# Patient Record
Sex: Female | Born: 1955 | Race: White | Hispanic: No | Marital: Married | Smoking: Never smoker
Health system: Southern US, Community
[De-identification: ages and names within clinical notes are randomized; demographics above are authoritative.]

## PROBLEM LIST (undated history)

## (undated) DIAGNOSIS — L309 Dermatitis, unspecified: Secondary | ICD-10-CM

## (undated) DIAGNOSIS — G4730 Sleep apnea, unspecified: Secondary | ICD-10-CM

## (undated) DIAGNOSIS — E039 Hypothyroidism, unspecified: Secondary | ICD-10-CM

## (undated) DIAGNOSIS — I10 Essential (primary) hypertension: Secondary | ICD-10-CM

## (undated) DIAGNOSIS — E079 Disorder of thyroid, unspecified: Secondary | ICD-10-CM

## (undated) HISTORY — DX: Essential (primary) hypertension: I10

## (undated) HISTORY — PX: ABDOMINAL HYSTERECTOMY: SHX81

## (undated) HISTORY — DX: Sleep apnea, unspecified: G47.30

## (undated) HISTORY — DX: Disorder of thyroid, unspecified: E07.9

## (undated) HISTORY — PX: BILATERAL OOPHORECTOMY: SHX1221

## (undated) HISTORY — PX: OTHER SURGICAL HISTORY: SHX169

## (undated) HISTORY — DX: Dermatitis, unspecified: L30.9

---

## 1994-02-14 HISTORY — PX: SINUS EXPLORATION: SHX5214

## 1997-08-06 ENCOUNTER — Ambulatory Visit (HOSPITAL_COMMUNITY): Admission: RE | Admit: 1997-08-06 | Discharge: 1997-08-06 | Payer: Self-pay | Admitting: Obstetrics & Gynecology

## 1997-08-27 ENCOUNTER — Other Ambulatory Visit: Admission: RE | Admit: 1997-08-27 | Discharge: 1997-08-27 | Payer: Self-pay | Admitting: Obstetrics & Gynecology

## 1997-12-18 ENCOUNTER — Other Ambulatory Visit: Admission: RE | Admit: 1997-12-18 | Discharge: 1997-12-18 | Payer: Self-pay | Admitting: *Deleted

## 1998-09-03 ENCOUNTER — Other Ambulatory Visit: Admission: RE | Admit: 1998-09-03 | Discharge: 1998-09-03 | Payer: Self-pay | Admitting: Obstetrics & Gynecology

## 1999-11-05 ENCOUNTER — Other Ambulatory Visit: Admission: RE | Admit: 1999-11-05 | Discharge: 1999-11-05 | Payer: Self-pay | Admitting: Obstetrics & Gynecology

## 2000-08-15 ENCOUNTER — Encounter: Payer: Self-pay | Admitting: Pulmonary Disease

## 2000-08-15 ENCOUNTER — Ambulatory Visit (HOSPITAL_BASED_OUTPATIENT_CLINIC_OR_DEPARTMENT_OTHER): Admission: RE | Admit: 2000-08-15 | Discharge: 2000-08-15 | Payer: Self-pay | Admitting: Internal Medicine

## 2000-09-08 ENCOUNTER — Ambulatory Visit (HOSPITAL_COMMUNITY): Admission: RE | Admit: 2000-09-08 | Discharge: 2000-09-08 | Payer: Self-pay | Admitting: *Deleted

## 2000-09-14 ENCOUNTER — Other Ambulatory Visit: Admission: RE | Admit: 2000-09-14 | Discharge: 2000-09-14 | Payer: Self-pay | Admitting: Obstetrics & Gynecology

## 2001-02-20 ENCOUNTER — Ambulatory Visit (HOSPITAL_COMMUNITY): Admission: RE | Admit: 2001-02-20 | Discharge: 2001-02-20 | Payer: Self-pay | Admitting: Gastroenterology

## 2001-04-24 ENCOUNTER — Ambulatory Visit (HOSPITAL_COMMUNITY): Admission: RE | Admit: 2001-04-24 | Discharge: 2001-04-24 | Payer: Self-pay | Admitting: Gastroenterology

## 2001-06-05 ENCOUNTER — Encounter: Payer: Self-pay | Admitting: Internal Medicine

## 2001-06-05 ENCOUNTER — Ambulatory Visit (HOSPITAL_COMMUNITY): Admission: RE | Admit: 2001-06-05 | Discharge: 2001-06-05 | Payer: Self-pay | Admitting: Internal Medicine

## 2001-11-12 ENCOUNTER — Other Ambulatory Visit: Admission: RE | Admit: 2001-11-12 | Discharge: 2001-11-12 | Payer: Self-pay | Admitting: Obstetrics & Gynecology

## 2002-12-06 ENCOUNTER — Other Ambulatory Visit: Admission: RE | Admit: 2002-12-06 | Discharge: 2002-12-06 | Payer: Self-pay | Admitting: Obstetrics & Gynecology

## 2004-09-09 ENCOUNTER — Ambulatory Visit: Payer: Self-pay | Admitting: Internal Medicine

## 2004-11-16 ENCOUNTER — Ambulatory Visit: Payer: Self-pay | Admitting: Internal Medicine

## 2004-11-30 ENCOUNTER — Ambulatory Visit: Payer: Self-pay | Admitting: Internal Medicine

## 2004-12-02 ENCOUNTER — Ambulatory Visit: Payer: Self-pay | Admitting: Internal Medicine

## 2004-12-23 ENCOUNTER — Ambulatory Visit: Payer: Self-pay | Admitting: Internal Medicine

## 2004-12-27 ENCOUNTER — Ambulatory Visit: Payer: Self-pay | Admitting: Internal Medicine

## 2005-01-14 ENCOUNTER — Ambulatory Visit: Payer: Self-pay | Admitting: Pulmonary Disease

## 2005-05-16 ENCOUNTER — Ambulatory Visit: Payer: Self-pay | Admitting: Internal Medicine

## 2005-06-15 ENCOUNTER — Ambulatory Visit: Payer: Self-pay | Admitting: Internal Medicine

## 2005-06-16 ENCOUNTER — Ambulatory Visit: Payer: Self-pay | Admitting: Internal Medicine

## 2005-07-27 ENCOUNTER — Ambulatory Visit: Payer: Self-pay | Admitting: Internal Medicine

## 2005-07-29 ENCOUNTER — Ambulatory Visit: Payer: Self-pay | Admitting: Internal Medicine

## 2005-08-03 ENCOUNTER — Ambulatory Visit: Payer: Self-pay | Admitting: Internal Medicine

## 2005-09-01 ENCOUNTER — Ambulatory Visit: Payer: Self-pay | Admitting: Internal Medicine

## 2005-10-31 ENCOUNTER — Ambulatory Visit: Payer: Self-pay | Admitting: Internal Medicine

## 2005-11-24 ENCOUNTER — Ambulatory Visit: Payer: Self-pay | Admitting: Internal Medicine

## 2005-12-05 ENCOUNTER — Ambulatory Visit: Payer: Self-pay | Admitting: Internal Medicine

## 2006-02-28 ENCOUNTER — Ambulatory Visit: Payer: Self-pay | Admitting: Internal Medicine

## 2006-07-13 ENCOUNTER — Ambulatory Visit (HOSPITAL_BASED_OUTPATIENT_CLINIC_OR_DEPARTMENT_OTHER): Admission: RE | Admit: 2006-07-13 | Discharge: 2006-07-13 | Payer: Self-pay | Admitting: Orthopedic Surgery

## 2006-07-13 ENCOUNTER — Encounter (INDEPENDENT_AMBULATORY_CARE_PROVIDER_SITE_OTHER): Payer: Self-pay | Admitting: Orthopedic Surgery

## 2006-08-21 ENCOUNTER — Ambulatory Visit: Payer: Self-pay | Admitting: Internal Medicine

## 2006-10-31 ENCOUNTER — Encounter: Admission: RE | Admit: 2006-10-31 | Discharge: 2006-10-31 | Payer: Self-pay | Admitting: Obstetrics & Gynecology

## 2007-02-17 DIAGNOSIS — J3089 Other allergic rhinitis: Secondary | ICD-10-CM

## 2007-02-17 DIAGNOSIS — J302 Other seasonal allergic rhinitis: Secondary | ICD-10-CM

## 2007-02-17 DIAGNOSIS — J4540 Moderate persistent asthma, uncomplicated: Secondary | ICD-10-CM

## 2007-02-17 DIAGNOSIS — G4733 Obstructive sleep apnea (adult) (pediatric): Secondary | ICD-10-CM

## 2007-02-19 ENCOUNTER — Ambulatory Visit: Payer: Self-pay | Admitting: Internal Medicine

## 2007-03-26 ENCOUNTER — Ambulatory Visit: Payer: Self-pay | Admitting: Internal Medicine

## 2007-06-12 ENCOUNTER — Ambulatory Visit: Payer: Self-pay | Admitting: Internal Medicine

## 2007-06-12 DIAGNOSIS — R55 Syncope and collapse: Secondary | ICD-10-CM | POA: Insufficient documentation

## 2007-06-12 DIAGNOSIS — D1722 Benign lipomatous neoplasm of skin and subcutaneous tissue of left arm: Secondary | ICD-10-CM | POA: Insufficient documentation

## 2007-06-12 DIAGNOSIS — D179 Benign lipomatous neoplasm, unspecified: Secondary | ICD-10-CM | POA: Insufficient documentation

## 2007-06-29 ENCOUNTER — Ambulatory Visit: Payer: Self-pay | Admitting: Internal Medicine

## 2007-06-29 DIAGNOSIS — R1011 Right upper quadrant pain: Secondary | ICD-10-CM | POA: Insufficient documentation

## 2007-06-30 LAB — CONVERTED CEMR LAB
ALT: 23 units/L (ref 0–35)
AST: 19 units/L (ref 0–37)
Albumin: 3.9 g/dL (ref 3.5–5.2)
Alkaline Phosphatase: 52 units/L (ref 39–117)
Amylase: 76 units/L (ref 27–131)
BUN: 12 mg/dL (ref 6–23)
Basophils Absolute: 0.1 10*3/uL (ref 0.0–0.1)
Basophils Relative: 0.7 % (ref 0.0–1.0)
Bilirubin, Direct: 0.1 mg/dL (ref 0.0–0.3)
CO2: 33 meq/L — ABNORMAL HIGH (ref 19–32)
Calcium: 9.9 mg/dL (ref 8.4–10.5)
Chloride: 100 meq/L (ref 96–112)
Creatinine, Ser: 0.8 mg/dL (ref 0.4–1.2)
Eosinophils Absolute: 0.3 10*3/uL (ref 0.0–0.7)
Eosinophils Relative: 4 % (ref 0.0–5.0)
GFR calc Af Amer: 98 mL/min
GFR calc non Af Amer: 81 mL/min
Glucose, Bld: 91 mg/dL (ref 70–99)
H Pylori IgG: NEGATIVE
HCT: 42.1 % (ref 36.0–46.0)
Hemoglobin: 14.6 g/dL (ref 12.0–15.0)
Lipase: 19 units/L (ref 11.0–59.0)
Lymphocytes Relative: 29.4 % (ref 12.0–46.0)
MCHC: 34.6 g/dL (ref 30.0–36.0)
MCV: 91.9 fL (ref 78.0–100.0)
Monocytes Absolute: 0.6 10*3/uL (ref 0.1–1.0)
Monocytes Relative: 8.2 % (ref 3.0–12.0)
Neutro Abs: 4.2 10*3/uL (ref 1.4–7.7)
Neutrophils Relative %: 57.7 % (ref 43.0–77.0)
Platelets: 257 10*3/uL (ref 150–400)
Potassium: 3.4 meq/L — ABNORMAL LOW (ref 3.5–5.1)
RBC: 4.57 M/uL (ref 3.87–5.11)
RDW: 12.4 % (ref 11.5–14.6)
Sodium: 140 meq/L (ref 135–145)
Total Bilirubin: 0.6 mg/dL (ref 0.3–1.2)
Total Protein: 6.9 g/dL (ref 6.0–8.3)
WBC: 7.3 10*3/uL (ref 4.5–10.5)

## 2007-07-18 ENCOUNTER — Encounter: Admission: RE | Admit: 2007-07-18 | Discharge: 2007-07-18 | Payer: Self-pay | Admitting: Internal Medicine

## 2007-10-23 ENCOUNTER — Ambulatory Visit: Payer: Self-pay | Admitting: Internal Medicine

## 2007-10-24 ENCOUNTER — Telehealth: Payer: Self-pay | Admitting: Internal Medicine

## 2008-01-17 ENCOUNTER — Telehealth (INDEPENDENT_AMBULATORY_CARE_PROVIDER_SITE_OTHER): Payer: Self-pay | Admitting: *Deleted

## 2008-02-29 ENCOUNTER — Ambulatory Visit: Payer: Self-pay | Admitting: Internal Medicine

## 2008-02-29 DIAGNOSIS — J328 Other chronic sinusitis: Secondary | ICD-10-CM | POA: Insufficient documentation

## 2008-02-29 DIAGNOSIS — J329 Chronic sinusitis, unspecified: Secondary | ICD-10-CM | POA: Insufficient documentation

## 2008-03-05 ENCOUNTER — Telehealth (INDEPENDENT_AMBULATORY_CARE_PROVIDER_SITE_OTHER): Payer: Self-pay | Admitting: *Deleted

## 2008-05-09 ENCOUNTER — Telehealth (INDEPENDENT_AMBULATORY_CARE_PROVIDER_SITE_OTHER): Payer: Self-pay | Admitting: *Deleted

## 2008-10-31 ENCOUNTER — Ambulatory Visit: Payer: Self-pay | Admitting: Internal Medicine

## 2008-10-31 DIAGNOSIS — J209 Acute bronchitis, unspecified: Secondary | ICD-10-CM | POA: Insufficient documentation

## 2009-03-11 ENCOUNTER — Telehealth (INDEPENDENT_AMBULATORY_CARE_PROVIDER_SITE_OTHER): Payer: Self-pay | Admitting: *Deleted

## 2009-03-12 ENCOUNTER — Telehealth: Payer: Self-pay | Admitting: Internal Medicine

## 2009-03-24 ENCOUNTER — Ambulatory Visit: Payer: Self-pay | Admitting: Internal Medicine

## 2009-03-24 DIAGNOSIS — R040 Epistaxis: Secondary | ICD-10-CM | POA: Insufficient documentation

## 2009-05-25 ENCOUNTER — Emergency Department (HOSPITAL_COMMUNITY): Admission: EM | Admit: 2009-05-25 | Discharge: 2009-05-25 | Payer: Self-pay | Admitting: Emergency Medicine

## 2009-09-16 ENCOUNTER — Encounter: Payer: Self-pay | Admitting: Internal Medicine

## 2009-10-28 ENCOUNTER — Ambulatory Visit: Payer: Self-pay | Admitting: Internal Medicine

## 2009-12-25 ENCOUNTER — Encounter: Payer: Self-pay | Admitting: Internal Medicine

## 2009-12-30 ENCOUNTER — Telehealth: Payer: Self-pay | Admitting: Internal Medicine

## 2010-03-07 ENCOUNTER — Encounter: Payer: Self-pay | Admitting: Internal Medicine

## 2010-03-16 NOTE — Progress Notes (Signed)
Summary: req to be seen asap today/ allergies  Phone Note Call from Patient Call back at Home Phone [phone]   Caller: Patient Call For: young Summary of Call: pt having an allergy 'attack". wants to be seen today. says this began this morning. she has been sneezing constantly/ runny nose/ itching ears and throat. "miserable". call back asap please Initial call taken by: Tivis Ringer, CNA,  December 30, 2009 3:11 PM  Follow-up for Phone Call        Spoke with patient-aware that [facility] is not seeing patients this afternoon but will send phone note for advice. Pt states she is having increased sneezing attacks today, runny nose, itchy throat and ears as well as chills. Pt has not be able to check for a fever; she did take a Benadryl and no relief. Pt states she was unable to get Omnaris nasal spray from 9-11 visit due to cost-would like a generic version that she could use as well and would be cheaper.    Allergies: PCN, Zpak, Codeine, and Seafood.   Reynaldo Minium CMA  December 30, 2009 3:35 PM   Additional Follow-up for Phone Call Additional follow up Details #1::        Per [facility]-ok to give Prednisone 20mg  #2 take 1 today then 1 tomorrow no refills.Reynaldo Minium CMA  December 30, 2009 4:38 PM     Additional Follow-up for Phone Call Additional follow up Details #2::    Pt aware that RX sent and to call Friday am if this doesnt stop.Reynaldo Minium CMA  December 30, 2009 4:39 PM   New/Updated Medications: PREDNISONE 20 MG TABS (PREDNISONE) take 1 by mouth today then 1 tomorrow Prescriptions: PREDNISONE 20 MG TABS (PREDNISONE) take 1 by mouth today then 1 tomorrow  #2 x 0   Entered by:   Reynaldo Minium CMA   Authorized by:   Waymon Budge MD   Signed by:   Reynaldo Minium CMA on 12/30/2009   Method used:   Electronically to        [facility] (retail)       [address]       [place], [place]  [address]       Ph: [phone]       Fax:  [phone]   RxID:   [id number]

## 2010-03-16 NOTE — Progress Notes (Signed)
Summary: notes/records  Phone Note From Other Clinic   Caller: [facility] [place] - [place] Call For: Maple Hudson Summary of Call: Need record on pt for surgery - sleep study, cpap, ekg and last office notes.  Faxed [phone]  Initial call taken by: Eugene Gavia,  March 11, 2009 2:09 PM  Follow-up for Phone Call        faxed notes,1/26//Juanita no sleep study, cpap, or ekg in emr. Follow-up by: Darletta Moll,  March 11, 2009 3:41 PM

## 2010-03-16 NOTE — Assessment & Plan Note (Signed)
Summary: SNORING/APC   Primary Dominique Brock/Referring [name]:  Norins  CC:  Follow up visit-Started snoring again per Spouse.  History of Present Illness: 02/29/08- Allergic rhinitis, asthma, OSA Voice changes out in cold air. Missed a day of work one day.Trying a power nasal rinser that seems to get water into her eustachian tubes. Some green and blood related to drying. Some headache. Chest is ok.  OSA- continues to use Vivactil, whichhas been sufficient to control snoring and keep her feeling alert in day time. Sleep hygiene has been good.  October 31, 2008 --Presents for an acute office visit. Complains of sinus pressure/congestion with PND causing sore throat, dry cough, soreness in chest, chills x1week . Denies chest pain, dyspnea, orthopnea, hemoptysis, fever, n/v/d, edema, headache,recent travel or antibiotics.  OTC not working.    March 24, 2009 Allergic rhinitis, asthma, OSA Feeling well. Pending oophorectomy next week. Sleep ok - vivactil is ok Angioedema with a salad when she went out to eat.  Alcohol gives her unpleasant nasal congestion and is avoided.  October 28, 2009- Allergic rhinitis, asthma,  Hx OSA No further episodes of angioedema, with food or otherwise, so that is not an ongoing problem.  Some itching and burning of eyes, frog in throat, husband reports snoring again. Sleepier in afternoon. Has continued Vivactil. Chest ok, no wheeze. Has no rescue inhaler- not needed.     Asthma History    Initial Asthma Severity Rating:    Age range: 12+ years    Symptoms: 0-2 days/week    Nighttime Awakenings: 0-2/month    Interferes w/ normal activity: no limitations    SABA use (not for EIB): 0-2 days/week    Asthma Severity Assessment: Intermittent   Preventive Screening-Counseling & Management  Alcohol-Tobacco     Smoking Status: never  Current Medications (verified): 1)  Hydrochlorothiazide 25 Mg  Tabs (Hydrochlorothiazide) .Marland Kitchen.. 1 Daily 2)  Vivactil 10  Mg  Tabs (Protriptyline Hcl) .... 1/2 At Bedtime For Sleep Apnea 3)  Atenolol 25 Mg  Tabs (Atenolol) .... Take 1 Tablet By Mouth Once A Day 4)  Vitamin D 400 Unit  Tabs (Cholecalciferol) .... 4 Daily 5)  Caltrate 600+d Plus 600-400 Mg-Unit  Tabs (Calcium Carbonate-Vit D-Min) .Marland Kitchen.. 1 By Mouth Once Daily 6)  Epipen 0.3 Mg/0.4ml (1:1000) Devi (Epinephrine Hcl (Anaphylaxis)) .... For Severe Allergic Reaction 7)  Estrogen Patch .... Twice A Week  Allergies (verified): 1)  Zithromax 2)  Codeine 3)  Penicillin 4)  * Seafood  Past History:  Past Medical History: Last updated: 03/26/2007 allergic rhinitis allergic asthma Diabetes, Type 2 Hypertension Sleep Apnea- mild  Family History: Last updated: 02/29/2008 mother - hypertension, hypercholesterolemia, liver cancer, allergies, DM father - healthy 2 siblings healthy  Social History: Last updated: 02/17/2007 Married with son and daughter Patient never smoked.   Risk Factors: Smoking Status: never (10/28/2009)  Past Surgical History: vaginal hysterectomy bilateral oophorectomy Sinus surgery 1996  Review of Systems      See HPI       The patient complains of nasal congestion/difficulty breathing through nose.  The patient denies shortness of breath with activity, shortness of breath at rest, productive cough, non-productive cough, coughing up blood, chest pain, irregular heartbeats, acid heartburn, indigestion, loss of appetite, weight change, abdominal pain, difficulty swallowing, sore throat, tooth/dental problems, headaches, itching, ear ache, joint stiffness or pain, rash, change in color of mucus, and fever.    Vital Signs:  Patient profile:   55 year old female Height:  65 inches Weight:      141.13 pounds BMI:     23.57 O2 Sat:      99 % on Room air Pulse rate:   73 / minute BP sitting:   130 / 88  (left arm) Cuff size:   regular  Vitals Entered By: Reynaldo Minium CMA (October 28, 2009 8:56 AM)  O2 Flow:   Room air CC: Follow up visit-Started snoring again per Spouse   Physical Exam  Additional Exam:  General: A/Ox3; pleasant and cooperative, NAD, medium build. SKIN: no rash, lesions NODES: no lymphadenopathy HEENT: [place]/AT, EOM- WNL, Conjuctivae- clear, Chronic periorbital edema, PERRLA, TM-WNL, Nose- pale clear drainage. , Throat- clear and wnl, Mallampati  III NECK: Supple w/ fair ROM, JVD- none, normal carotid impulses w/o bruits Thyroid- normal to palpation CHEST: Clear to P&A HEART: RRR, no m/g/r heard ABDOMEN: Soft and nl; HYQ:MVHQ, nl pulses, no edema  NEURO: Grossly intact to observation      Impression & Recommendations:  Problem # 1:  ALLERGIC RHINITIS (ICD-477.9)  Seasonally worse. This corresponds to her increased snoring and head congestion. Sudafed makes her chest hurt. We discussed nasal strips, phenylephrine and a nasal steroid spray. She does saline rinse some.  Her updated medication list for this problem includes:    Omnaris 50 Mcg/act Susp (Ciclesonide) .Marland Kitchen... 1-2 puffs each nostril once daily at bedtime  Problem # 2:  ALLERGIC ASTHMA (ICD-493.00) Not actively bothering her. We don't need a rescue inhaler at this time.  Problem # 3:  Hx of SLEEP APNEA (ICD-780.57)  She was not told of breathing problems at the time of her surgery/ anesthesia, and it sounds as if Vivacitl had done ok until her allergic rhinitis (and some weight gain) out ran it. We will see how she does as we treat her rhinitis.  Medications Added to Medication List This Visit: 1)  Estrogen Patch  .... Twice a week 2)  Omnaris 50 Mcg/act Susp (Ciclesonide) .Marland Kitchen.. 1-2 puffs each nostril once daily at bedtime  Other Orders: Est. Patient Level IV (46962)  Patient Instructions: 1)  Please schedule a follow-up appointment in 6 months. 2)  Sample nasal steroid spray: 1-2 puffs each nostril once daily at bedtime. Script for [employer]. 3)  Try otc decongestant phenylephrine/ Sudafed-PE if needed,  perhaps just occasionally, once daily in the morning. 4)  Try nasal strips/ Breathe Right 5)    6)  Vivactil refilled Prescriptions: OMNARIS 50 MCG/ACT SUSP (CICLESONIDE) 1-2 puffs each nostril once daily at bedtime  #1 x prn   Entered and Authorized by:   Waymon Budge MD   Signed by:   Waymon Budge MD on 10/28/2009   Method used:   Print then Give to Patient   RxID:   [id number] VIVACTIL 10 MG  TABS (PROTRIPTYLINE HCL) 1/2 at bedtime for sleep apnea  #30 Each x 4   Entered and Authorized by:   Waymon Budge MD   Signed by:   Waymon Budge MD on 10/28/2009   Method used:   Print then Give to Patient   RxID:   [id number]

## 2010-03-16 NOTE — Assessment & Plan Note (Signed)
Summary: 1 YEAR/MBW   Primary Provider/Referring Provider:  Norins  CC:  Yearly follow up visit-no complaints; Surgery on Thursday.Marland Kitchen  History of Present Illness: 03/26/07- 1 week head congestion. Throat,mouth, ears and nose itch,. Tight across forehead . Chest ok. Nasal discharge is yellow green red. Benadryl didn't help. Zyrtec helps but wears out. cautious about decongestants because of her BP. 10/23/07- 55 year old allergic rhinitis and asthma.  October 23, 2007---Complains of left sided facial pressure x 2 days, along with runny nose and sneezing. congestion and dry cough.   02/29/08- Allergic rhinitis, asthma, OSA Voice changes out in cold air. Missed a day of work one day.Trying a power nasal rinser that seems to get water into her eustachian tubes. Some green and blood related to drying. Some headache. Chest is ok.  OSA- continues to use Vivactil, whichhas been sufficient to control snoring and keep her feeling alert in day time. Sleep hygiene has been good.  October 31, 2008 --Presents for an acute office visit. Complains of sinus pressure/congestion with PND causing sore throat, dry cough, soreness in chest, chills x1week . Denies chest pain, dyspnea, orthopnea, hemoptysis, fever, n/v/d, edema, headache,recent travel or antibiotics.  OTC not working.    March 24, 2009 Allergic rhinitis, asthma, OSA Feeling well. Pending oophorectomy next week. Sleep ok - vivactil is ok Angioedema with a salad when she went out to eat.  Alcohol gives her unpleasant nasal congestion and is avoided.   Current Medications (verified): 1)  Hydrochlorothiazide 25 Mg  Tabs (Hydrochlorothiazide) .Marland Kitchen.. 1 Daily 2)  Vivactil 10 Mg  Tabs (Protriptyline Hcl) .... 1/2 At Bedtime For Sleep Apnea 3)  Epipen 2-Pak 0.3 Mg/0.46ml (1:1000)  Devi (Epinephrine Hcl (Anaphylaxis)) .... For Severe Allergic Reaction 4)  Atenolol 25 Mg  Tabs (Atenolol) .... Take 1 Tablet By Mouth Once A Day 5)  Vitamin D 400 Unit  Tabs  (Cholecalciferol) .... 4 Daily 6)  Caltrate 600+d Plus 600-400 Mg-Unit  Tabs (Calcium Carbonate-Vit D-Min) .Marland Kitchen.. 1 By Mouth Once Daily 7)  Vivactil 10 Mg Tabs (Protriptyline Hcl) .Marland Kitchen.. 1 Daily 8)  Epipen 0.3 Mg/0.7ml (1:1000) Devi (Epinephrine Hcl (Anaphylaxis)) .... For Severe Allergic Reaction  Allergies (verified): 1)  Zithromax 2)  Codeine 3)  Penicillin 4)  * Seafood  Past History:  Past Medical History: Last updated: 03/26/2007 allergic rhinitis allergic asthma Diabetes, Type 2 Hypertension Sleep Apnea- mild  Past Surgical History: Last updated: 03/26/2007 vaginal hysterectomy  Family History: Last updated: 02/29/2008 mother - hypertension, hypercholesterolemia, liver cancer, allergies, DM father - healthy 2 siblings healthy  Social History: Last updated: 02/17/2007 Married with son and daughter Patient never smoked.   Risk Factors: Smoking Status: never (10/23/2007)  Review of Systems      See HPI  The patient denies anorexia, fever, weight loss, weight gain, vision loss, decreased hearing, hoarseness, chest pain, syncope, dyspnea on exertion, peripheral edema, prolonged cough, headaches, hemoptysis, abdominal pain, and severe indigestion/heartburn.         Recent epistaxis while on aspirin  Vital Signs:  Patient profile:   55 year old female Height:      65 inches Weight:      139.13 pounds BMI:     23.24 O2 Sat:      100 % on Room air Pulse rate:   87 / minute BP sitting:   128 / 84  (left arm) Cuff size:   regular  Vitals Entered By: Reynaldo Minium CMA (March 24, 2009 3:32 PM)  O2  Flow:  Room air  Physical Exam  Additional Exam:  General: A/Ox3; pleasant and cooperative, NAD, medium build SKIN: no rash, lesions NODES: no lymphadenopathy HEENT: [place]/AT, EOM- WNL, Conjuctivae- clear, Chronic periorbital edema, PERRLA, TM-WNL, Nose- pale clear drainage- no obvious bleeding source. , Throat- clear and wnl, Melampatti III NECK: Supple w/ fair ROM,  JVD- none, normal carotid impulses w/o bruits Thyroid- normal to palpation CHEST: Clear to P&A HEART: RRR, no m/g/r heard ABDOMEN: Soft and nl; nml bowel sounds; no organomegaly or masses noted KGM:WNUU, nl pulses, no edema  NEURO: Grossly intact to observation      Impression & Recommendations:  Problem # 1:  ALLERGIC ASTHMA (ICD-493.00)  She is doing very well now and is clear for necessary surgery next week with no problems reported,  Orders: Est. Patient Level III (72536)  Problem # 2:  EPISTAXIS, RECURRENT (ICD-784.7)  Noted recently on maintenance aspirin. I don't see an anterior source. Suggested she ask her doctor to change to baby aspirin. We discussed compression and afrin if needed.  Patient Instructions: 1)  Schedule return in one year, earlier if needed 2)  Call as needed for refills 3)  Epipen refilled 4)  Consider an antihistamine an hour or so before going out to eat, if concerned. 5)  Afrin may help stop a mild nose bleed. 6)  Ask about reducing aspirin to 81 mg/ baby aspirin if you keep having nosebleeds Prescriptions: EPIPEN 0.3 MG/0.3ML (1:1000) DEVI (EPINEPHRINE HCL (ANAPHYLAXIS)) For severe allergic reaction  #1 x prn   Entered and Authorized by:   Waymon Budge MD   Signed by:   Waymon Budge MD on 03/24/2009   Method used:   Print then Give to Patient   RxID:   [id number]

## 2010-03-16 NOTE — Procedures (Signed)
Summary: Colonoscopy / [facility]  Colonoscopy / [facility]   Imported By: Lennie Odor 01/05/2010 09:07:41  _____________________________________________________________________  External Attachment:    Type:   Image     Comment:   External Document

## 2010-03-16 NOTE — Letter (Signed)
Summary: [facility]  [facility]   Imported By: Lennie Odor 09/22/2009 13:53:05  _____________________________________________________________________  External Attachment:    Type:   Image     Comment:   External Document

## 2010-03-16 NOTE — Progress Notes (Signed)
Summary: notes  Phone Note From Other Clinic Call back at [phone]   Caller: [facility] [place] [facility] Call For: Maple Hudson Summary of Call: Please fax over notes dated 09/01/2005 thru 08/22/2006 - Fax [phone] - pt was a former pt ogf [facility] also before he came to [facility] [employer].  Looking for a sleep study pt says she had done. Initial call taken by: Eugene Gavia,  March 12, 2009 10:29 AM  Follow-up for Phone Call        faxed all notes 11/27.//Juanita Follow-up by: Darletta Moll,  March 12, 2009 12:01 PM  Additional Follow-up for Phone Call Additional follow up Details #1::        pt called about sleep study results that she thinks was done in 2002.  Surgical center cannot proceed without this.  Please see if you can find this for pt. [phone] x [phone] - pt said Dr. Debby Bud initiated  Sleep Study.  Was originally sent to [facility] and then pt went to [facility].  Pt says was done @ [facility].  Please call pt as well as Pat. Additional Follow-up by: Eugene Gavia,  March 12, 2009 2:08 PM    Additional Follow-up for Phone Call Additional follow up Details #2::    Requested chart from medical records to see if sleep study is in chart. Rhonda Cobb  March 12, 2009 3:24 PM Called pt and advised her that I had located the sleep study. She advised me to call Dennie Bible at the surgical center phone [phone] ext [phone]. I called and LMOAM for Pat that I had located pt's sleep study and to call me with a fax number so I can fax study to her. Waiting on her to return my call. Alfonso Ramus  March 12, 2009 4:15 PM  Sleep Study faxed to [facility] at the surgical center at [phone]. Rhonda Cobb  March 13, 2009 11:11 AM

## 2010-03-24 ENCOUNTER — Ambulatory Visit (INDEPENDENT_AMBULATORY_CARE_PROVIDER_SITE_OTHER): Payer: PRIVATE HEALTH INSURANCE | Admitting: Internal Medicine

## 2010-03-24 ENCOUNTER — Encounter: Payer: Self-pay | Admitting: Internal Medicine

## 2010-03-24 DIAGNOSIS — J45909 Unspecified asthma, uncomplicated: Secondary | ICD-10-CM

## 2010-03-24 DIAGNOSIS — G4730 Sleep apnea, unspecified: Secondary | ICD-10-CM

## 2010-03-24 DIAGNOSIS — J309 Allergic rhinitis, unspecified: Secondary | ICD-10-CM

## 2010-04-01 NOTE — Assessment & Plan Note (Signed)
Summary: 12 mth//acp   Primary Provider/Referring Provider:  Norins  CC:  Follow up visit-stuffy at times and drainage as well.Marland Kitchen  History of Present Illness:  March 24, 2009 Allergic rhinitis, asthma, OSA Feeling well. Pending oophorectomy next week. Sleep ok - vivactil is ok Angioedema with a salad when she went out to eat.  Alcohol gives her unpleasant nasal congestion and is avoided.  October 28, 2009- Allergic rhinitis, asthma,  Hx OSA No further episodes of angioedema, with food or otherwise, so that is not an ongoing problem.  Some itching and burning of eyes, frog in throat, husband reports snoring again. Sleepier in afternoon. Has continued Vivactil. Chest ok, no wheeze. Has no rescue inhaler- not needed.  March 24, 2010- Allergic rhinitis, asthma,  Hx OSA/ failed CPAP Nurse-CC: Follow up visit-stuffy at times and drainage as well. Increased head congestion x 2-3 days. Not an obvious cold. Mostly congested with little sneeze or drip so far. Using Afrin about once daily for this. Mild ear pressure, no headache or fever.  Sleep Apnea- Husband not telling her she snores. S/P TAH, but having hot flashes still. Thyroid has been ok. Associates daytime tiredness with some sleep disruption related to body temp and hot flashes.      Asthma History    Asthma Control Assessment:    Age range: 12+ years    Symptoms: 0-2 days/week    Nighttime Awakenings: 0-2/month    Interferes w/ normal activity: no limitations    SABA use (not for EIB): 0-2 days/week    Asthma Control Assessment: Well Controlled   Preventive Screening-Counseling & Management  Alcohol-Tobacco     Smoking Status: never  Current Medications (verified): 1)  Hydrochlorothiazide 25 Mg  Tabs (Hydrochlorothiazide) .Marland Kitchen.. 1 Daily 2)  Vivactil 10 Mg  Tabs (Protriptyline Hcl) .... 1/2 At Bedtime For Sleep Apnea 3)  Atenolol 25 Mg  Tabs (Atenolol) .... Take 1 Tablet By Mouth Once A Day 4)  Vitamin D 400 Unit   Tabs (Cholecalciferol) .... 4 Daily 5)  Caltrate 600+d Plus 600-400 Mg-Unit  Tabs (Calcium Carbonate-Vit D-Min) .Marland Kitchen.. 1 By Mouth Once Daily 6)  Epipen 0.3 Mg/0.42ml (1:1000) Devi (Epinephrine Hcl (Anaphylaxis)) .... For Severe Allergic Reaction  Allergies (verified): 1)  Zithromax 2)  Codeine 3)  Penicillin 4)  * Seafood  Past History:  Past Medical History: Last updated: 03/26/2007 allergic rhinitis allergic asthma Diabetes, Type 2 Hypertension Sleep Apnea- mild  Past Surgical History: Last updated: 10/28/2009 vaginal hysterectomy bilateral oophorectomy Sinus surgery 1996  Family History: Last updated: 02/29/2008 mother - hypertension, hypercholesterolemia, liver cancer, allergies, DM father - healthy 2 siblings healthy  Social History: Last updated: 02/17/2007 Married with son and daughter Patient never smoked.   Risk Factors: Smoking Status: never (03/24/2010)  Review of Systems      See HPI       The patient complains of nasal congestion/difficulty breathing through nose and sneezing.  The patient denies shortness of breath with activity, shortness of breath at rest, productive cough, non-productive cough, coughing up blood, chest pain, irregular heartbeats, acid heartburn, indigestion, loss of appetite, weight change, abdominal pain, difficulty swallowing, sore throat, tooth/dental problems, headaches, anxiety, hand/feet swelling, rash, change in color of mucus, and fever.    Vital Signs:  Patient profile:   55 year old female Height:      65 inches Weight:      146 pounds BMI:     24.38 O2 Sat:      100 %  on Room air Pulse rate:   76 / minute BP sitting:   116 / 62  (left arm) Cuff size:   regular  Vitals Entered By: Reynaldo Minium CMA (March 24, 2010 8:56 AM)  O2 Flow:  Room air CC: Follow up visit-stuffy at times and drainage as well.   Physical Exam  Additional Exam:  General: A/Ox3; pleasant and cooperative, NAD, medium build. SKIN: no rash,  lesions NODES: no lymphadenopathy HEENT: [place]/AT, EOM- WNL, Conjuctivae- clear, Chronic periorbital edema, PERRLA, TM-WNL, Nose-mild pallor and edema. , Throat- clear and wnl, Mallampati  III, tongue lightly coated NECK: Supple w/ fair ROM, JVD- none, normal carotid impulses w/o bruits Thyroid- normal to palpation CHEST: Clear to P&A HEART: RRR, no m/g/r heard ABDOMEN: Soft and nl; ZOX:WRUE, nl pulses, no edema  NEURO: Grossly intact to observation      Impression & Recommendations:  Problem # 1:  ALLERGIC RHINITIS (ICD-477.9)  This may be an early response to rising seasonal pollens. She is not obviously infected. Cautioned against overuse of Afrin.  Discussed nasal steroids and antihistamines.  The following medications were removed from the medication list:    Omnaris 50 Mcg/act Susp (Ciclesonide) .Marland Kitchen... 1-2 puffs each nostril once daily at bedtime Her updated medication list for this problem includes:    Nasonex 50 Mcg/act Susp (Mometasone furoate) .Marland Kitchen... 1-2 puffs each nostril once daily at bedtime  Problem # 2:  Hx of SLEEP APNEA (ICD-780.57)  Bigger issue now seems to be insomnia. We discussed management of temp at night and sleep hygiene, aiming for cooler sleep environment.  We can script med if needed. She makes it sound as if hormone issues remain and she has a gyn visit pending.  Problem # 3:  ALLERGIC ASTHMA (ICD-493.00) Still keeps an Epipen but has been well controlled- mild intermittent.   Medications Added to Medication List This Visit: 1)  Nasonex 50 Mcg/act Susp (Mometasone furoate) .Marland Kitchen.. 1-2 puffs each nostril once daily at bedtime  Other Orders: Est. Patient Level IV (45409)  Patient Instructions: 1)  Please schedule a follow-up appointment in 1 year. 2)  Sample and script Nasonex nasal steroid spray 3)   1-2 puffs each nostril once daily at bedtime 4)  Remember to aim for a cool/ comfortable bed sleeping environment. if you are waking warm, separate from  hot flash episodes, then it will interfere with sleep.  Prescriptions: NASONEX 50 MCG/ACT SUSP (MOMETASONE FUROATE) 1-2 puffs each nostril once daily at bedtime  #1 x prn   Entered and Authorized by:   Waymon Budge MD   Signed by:   Waymon Budge MD on 03/24/2010   Method used:   Print then Give to Patient   RxID:   [id number]

## 2010-06-29 NOTE — Assessment & Plan Note (Signed)
[facility]                             PULMONARY OFFICE NOTE   NAME:Castrejon, KEYSHIA ORWICK                      MRN:          [id number]  DATE:08/21/2006                            DOB:          [birth year]    PROBLEM:  1. Allergic rhinitis.  2. Asthma.  3. History of mild obstructive sleep apnea.   HISTORY:  She says she is doing quite well, except that she has had some  head congestion recently for which she is using either Zyrtec D or  occasional Afrin.  We discussed the Afrin.  She dislikes saline lavage  because she does not feel that she gets the water back out of her head.  I discussed ways to do this.  We talked about her sleep apnea status.  Her husband is not complaining that she snores or stops breathing.  On  no treatment except Vivactil, which she needs refilled.   MEDICATIONS:  1. Zyrtec D.  2. Multivitamins.  3. Aspirin.  4. Atenolol.  5. Hydrochlorothiazide 25 mg.  6. Vivactil 10 mg x1/2 nightly.  7. Albuterol rescue inhaler used p.r.n.   OBJECTIVE:  Weight 135 pounds.  BP 108/78.  Pulse 74.  Room air  saturation 100%.  She has chronic periorbital edema.  There is some white mucus in the  left nostril.  Voice quality is normal without stridor.  No adenopathy.  LUNGS:  Clear.  HEART:  Sounds normal.   IMPRESSION:  1. Allergic rhinitis.  2. Obstructive sleep apnea adequately controlled with Vivactil as long      as she does not gain weight.  3. Mild intermittent asthma is adequately controlled.   PLAN:  1. I gave her a booklet on nasal saline treatments.  2. Refilled Vivactil 10 mg x1/2 nightly.  3. Schedule return in 6 months, earlier p.r.n.     Clinton D. Maple Hudson, MD, Tonny Bollman, FACP  Electronically Signed    CDY/MedQ  DD: 08/22/2006  DT: 08/23/2006  Job #: [id number]

## 2010-07-02 ENCOUNTER — Encounter: Payer: Self-pay | Admitting: Internal Medicine

## 2010-07-02 ENCOUNTER — Ambulatory Visit (INDEPENDENT_AMBULATORY_CARE_PROVIDER_SITE_OTHER): Payer: PRIVATE HEALTH INSURANCE | Admitting: Internal Medicine

## 2010-07-02 VITALS — BP 124/64 | HR 79 | Ht 65.0 in | Wt 145.8 lb

## 2010-07-02 DIAGNOSIS — G4730 Sleep apnea, unspecified: Secondary | ICD-10-CM

## 2010-07-02 DIAGNOSIS — J309 Allergic rhinitis, unspecified: Secondary | ICD-10-CM

## 2010-07-02 MED ORDER — METHYLPREDNISOLONE ACETATE 80 MG/ML IJ SUSP
80.0000 mg | Freq: Once | INTRAMUSCULAR | Status: AC
  Administered 2010-07-02: 80 mg via INTRAMUSCULAR

## 2010-07-02 MED ORDER — PHENYLEPHRINE HCL 1 % NA SOLN
3.0000 [drp] | Freq: Once | NASAL | Status: AC
  Administered 2010-07-02: 3 [drp] via NASAL

## 2010-07-02 NOTE — Procedures (Signed)
[place]  Patient:    Dominique Brock, Dominique Brock Visit Number: [id number] MRN: [id number]          Service Type: END Location: ENDO Attending Physician:  Orland Mustard Dictated by:   Llana Aliment. Randa Evens, M.D. Proc. Date: 04/24/01 Admit Date:  04/24/2001 Discharge Date: 04/24/2001   CC:         Thad Ranger, M.D  Rosalyn Gess. Norins, M.D. [facility]  Casimiro Needle D. Arletha Grippe, M.D.  Clinton D. Maple Hudson, M.D.   Procedure Report  DATE OF BIRTH:  Aug 18, 1955  PROCEDURE PERFORMED:  Esophageal manometry.  INDICATIONS:  The patient has had a history of esophagus reflux and had a somewhat interpretable 24-hour pH study done in Dr. Christella Hartigan office. The patient had been on some acid-reducing medicines. Her composite score is felt to be in the normal range. She had a slightened reddened esophagus on endoscopy. She has a number of symptoms including pulmonary allergies, sleep apnea with no real improvement with Nexium. She remained on Zantac and Vivactil. Due to the uncertainty over the severity of her reflux, a manometry and repeat 24-hour pH probe were performed.  DESCRIPTION OF PROCEDURE:  The procedure was performed on the [facility] in the normal manner. No provocative studies were performed and the results are as follows:  1. Upper esophageal sphincter relaxes with swallowing. Pharyngeal    spikes were present. 2. Esophageal body mean amplitude 82 mmHg in the distal esophagus    with a mean duration of 3.9 seconds. Peristalsis appears normal    in all wet swallows shown. 3. The lower esophageal sphincter was poorly demonstrated on the    studies presented to me, but had a mean pressure of 22 mmHg and    appeared to relax completely with swallowing.  IMPRESSION:  Normal manometry.  PLAN:  Will proceed with 24-hour pH probe as planned. Dictated by:   Llana Aliment. Randa Evens, M.D. Attending Physician:  Orland Mustard DD:   05/06/01 TD:  05/07/01 Job: [id number]

## 2010-07-02 NOTE — Procedures (Signed)
[facility]  Patient:    Dominique Brock, Dominique Brock Visit Number: [id number] MRN: [id number]          Service Type: Attending:  Fayrene Fearing L. Randa Evens, M.D. Dictated by:   Llana Aliment. Randa Evens, M.D. Proc. Date: 02/20/01   CC:         Thad Ranger, M.D             Rosalyn Gess. Norins, M.D. [facility]                           Procedure Report  PROCEDURE PERFORMED:  Esophagogastroduodenoscopy.  MEDICATIONS:  Cetacaine spray.  Fentanyl 75 mcg, Versed 8 mg IV.  INDICATION:  The patient with a multitude of upper respiratory and sinus problems with the diagnosis of sleep apnea.  She has been considered for an antireflux operation.  She has not had a previous endoscopy.  For this reason endoscopy is performed.  DESCRIPTION OF PROCEDURE:  The procedure had been explained to the patient and consent obtained.  With the patient in the left lateral decubitus position, the Olympus video endoscope was inserted blindly into the esophagus and advanced under direct visualization.  The stomach was entered.  The pylorus identified and passed. The duodenum including the bulb and second portion were seen well and were unremarkable.  The scope was withdrawn back into the stomach, the pyloric channel.  The antrum and body were seen and were normal. The fundus and cardia were seen on retroflex view and were normal.  The GE junction was seen at 40 cm.  The GE junction did appear to be somewhat tight there was no obvious wide patency and no obvious reflux.  There was no evidence of Barretts esophagus and no significant hiatal hernia.  The distal esophagus was slightly reddened. The proximal esophagus was normal.  The scope was withdrawn.  The patient tolerated the procedure well.  ASSESSMENT: Mild gastroesophageal reflux disease by endoscopy. No obvious complications.  PLAN:  Will see back in the office in six weeks and in the interim will try to obtain further information regarding the patients  previous studies including 24 hour pH probe. Dictated by:   Llana Aliment. Randa Evens, M.D. Attending:  Llana Aliment. Randa Evens, M.D. DD:  02/20/01 TD:  02/20/01 Job: [id number]

## 2010-07-02 NOTE — Procedures (Signed)
[place]  Patient:    MANUELA, HALBUR Visit Number: [id number] MRN: [id number]          Service Type: END Location: ENDO Attending Physician:  Orland Mustard Dictated by:   Llana Aliment. Randa Evens, M.D. Admit Date:  04/24/2001 Discharge Date: 04/24/2001   CC:         Thad Ranger, M.D  Rosalyn Gess. Norins, M.D. [facility]  Casimiro Needle D. Arletha Grippe, M.D.  Clinton D. Maple Hudson, M.D.   Procedure Report  DATE OF BIRTH:  1955/04/26.  PROCEDURE PERFORMED:  24-hour pH probe.  INDICATIONS:  Please see dictated esophageal manometry note.  DESCRIPTION OF PROCEDURE:   The 24-hour probe was down for 23 minutes and 54 seconds. The results are as follows for the distal channel:  1. Upright time and reflux 1.4%, normal less than 6.3%. 2. Recumbent time 0.6%, normal less than 1.2%. 3. Total reflux time 1.0%, normal less than 4.2%. 4. Total reflux episodes over 5 minutes - none. 5. Longest reflux episode 1.9 minutes. 6. Total episodes 44, normal being less than 50. 7. Calculated composite score for distal channel 6.2 with normal    being less than 22.  IMPRESSION:  This 24-hour pH probe with the patient being for several days off antireflux medications indicates esophageal reflux within the normal range. At this point it is difficult to implicate esophageal reflux as the cause of her continued pulmonary symptoms.  PLAN:  Will discuss these findings with her in further detail. Dictated by:   Llana Aliment. Randa Evens, M.D. Attending Physician:  Orland Mustard DD:  05/06/01 TD:  05/07/01 Job: [id number]

## 2010-07-02 NOTE — Progress Notes (Signed)
  Subjective:    Patient ID: Dominique Brock, female    DOB: [birth year], 55 y.o.   MRN: [id number]  HPI 07/02/10- 67 yo F never smoker followed for asthma, allergic rhinitis.  Last here March 24, 2010. Note reviewed. Got through FEB/ March and April ok. Just in the last 2 weeks, says her allergies have bothered her with pressure behind her eyes, right face fullness, no green and no sore throat or fever. Minor cough, no wheeze. She had tried coricidin, avoiding decongestants/ BP stimulants. She didn't find steroid nasal sprays helpful., and liked zyrtec better than Xyzal.    Review of Systems Constitutional:   No weight loss, night sweats,  Fevers, chills, fatigue, lassitude. HEENT: ,  Difficulty swallowing,  Tooth/dental problems,  Sore throat,                No sneezing, itching, ear ache,post nasal drip,   CV:  No chest pain,  Orthopnea, PND, swelling in lower extremities, anasarca, dizziness, palpitations  GI  No heartburn, indigestion, abdominal pain, nausea, vomiting, diarrhea, change in bowel habits, loss of appetite  Resp: No shortness of breath with exertion or at rest.  No excess mucus, no productive cough, Minor non-productive cough,  No coughing up of blood.  No change in color of mucus.  No wheezing.  Skin: no rash or lesions.  GU: no dysuria, change in color of urine, no urgency or frequency.  No flank pain.  MS:  No joint pain or swelling.  No decreased range of motion.  No back pain.  Psych:  No change in mood or affect. No depression or anxiety.  No memory loss.      Objective:   Physical Exam General- Alert, Oriented, Affect-appropriate, Distress- none acute   Looks tired  Skin- rash-none, lesions- none, excoriation- none  Lymphadenopathy- none  Head- atraumatic  Eyes- Gross vision intact, PERRLA, conjunctivae clear, secretions    Periorbital edema is chronic  Ears- Hearing, canals,- normal Nose- Clear, no-  Septal dev, mucus, polyps, erosion, perforation    Throat- Mallampati II , mucosa clear , drainage- none, tonsils- atrophic  Neck- flexible , trachea midline, no stridor , thyroid nl, carotid no bruit  Chest - symmetrical excursion , unlabored     Heart/CV- RRR , no murmur , no gallop  , no rub, nl s1 s2                     - JVD- none , edema- none, stasis changes- none, varices- none     Lung- clear to P&A, wheeze- none, cough- none , dullness-none, rub- none     Chest wall-   Abd- tender-no, distended-no, bowel sounds-present, HSM- no  Br/ Gen/ Rectal- Not done, not indicated  Extrem- cyanosis- none, clubbing, none, atrophy- none, strength- nl  Neuro- grossly intact to observation         Assessment & Plan:

## 2010-07-02 NOTE — Assessment & Plan Note (Signed)
[facility]                               PULMONARY OFFICE NOTE   NAME:Dominique Brock, Dominique Brock                      MRN:          [id number]  DATE:10/31/2005                            DOB:          1955-03-25    PROBLEMS:  1. Allergic rhinitis.  2. Asthma.  3. History of obstructive sleep apnea.   PRIMARY CARE PHYSICIAN:  Rosalyn Gess. Norins, MD   HISTORY OF PRESENT ILLNESS:  She has been using Vivactil at one half of a 10  mg tablet, and feels it does help her sleep apnea sufficiently.  We  commended that it was unusual for somebody with her body habitus to have  much problems with sleep apnea, although, she is well documented by previous  sleep study.  She states that her aunt, who is built like her, has been  diagnosed with sleep apnea and is also using CPAP.  She is having difficulty  getting here to get her allergy shots because of her job.  We reviewed risk  considerations and options related to allergy vaccine policy and concerns  related to administration outside of a medical office, anaphylaxis and  epinephrine.  She reviewed and signed our waiver, and wishes her husband to  learn to give her shots, which means that there would be an adult with her  at the time.  We reviewed and prescribed an Epi pen.  She feels she is  better off staying on allergy vaccine if she can handle the logistics.  Veramyst nasal spray helped her nose substantially, but then caused some  epistaxis.  I suggested that she use one spray in alternating nostrils every  other day.   OBJECTIVE:  Weight 130 pounds, blood pressure 124/70, pulse regular at 84,  room air saturation 99%.  Chronic periorbital edema bilaterally with no  obvious nasal congestion now.  Conjunctivae are not injected.  No  adenopathy.  No neck vein distention, stridor or thyromegaly.  Lungs were  clear.  Heart sounds regular without murmur.  No restlessness or tremor.   IMPRESSION:  1. Allergic  rhinitis.  2. Obstructive sleep apnea.  3. Mild intermittet asthma.   PLAN:  1. I am satisfied that she has a fair and realistic understanding of the      choices and tradeoffs and is able to make her own decision      appropriately to get her allergy injections at home.  2. We have prescribed an Epi pen with instructions.  3. Refill albuterol inhaler.  4. Reduce Veramyst to alternate nostrils q.o.d.  5. Nasal saline spray.  6. Return in four months, earlier p.r.n.                                   Clinton D. Maple Hudson, MD, FCCP, FACP   CDY/MedQ  DD:  11/02/2005  DT:  11/03/2005  Job #:  [id number]

## 2010-07-02 NOTE — Assessment & Plan Note (Signed)
[facility]                               PULMONARY OFFICE NOTE   NAME:STANLEYHamdi, Vari                      MRN:          [id number]  DATE:11/24/2005                            DOB:          1955/11/25    PULMONARY/ALLERGY FOLLOWUP:   PRIMARY PHYSICIAN:  Rosalyn Gess. Norins, M.D.   PROBLEM LIST:  1. Allergic rhinitis.  2. Asthma.  3. History of obstructive sleep apnea.   HISTORY:  She has been building allergy vaccine towards maintenance with no  problems.  Four days ago she developed her familiar pattern of head  congestion, ears bulging, nonproductive cough and throat clearing with no  sore throat or fever, adenopathy, or GI upset.  There has been no  significant itching.  She tried Clarinex-D and then started herself on some  leftover Levaquin which she took for 5 days and has just ended.  She is  using her routine meds including aspirin, HCTZ, Clarinex-D, Veramyst nasal  spray once to each nostril daily.  She has an albuterol inhaler but has not  been needing to use it.   OBJECTIVE:  VITAL SIGNS:  Weight 128 pounds, BP 132/80, pulse regular 102,  room air saturation 99%.  HEENT:  There is chronic periorbital edema.  Conjunctivae are not injected.  Her nose is congested with a lot of clear mucus.  Pharynx is not red.  There  is no adenopathy.  Voice quality seems normal.  LUNGS:  Clear.  HEART:  Sounds regular without murmur or gallop.   IMPRESSION:  This is probably a viral syndrome with a background of allergic  rhinitis.  She is not yet wheezing but is concerned that will start.   PLAN:  1. Samples of Dura-Vent as a decongestant, one q.6 h. p.r.n.  2. Samples of Zyflo q.i.d. for 5 days.  We discussed liver issues.  She      has no history of liver problems and we do not intend to keep her on      this but are trying it as an anti-inflammatory attempting to be steroid      sparing.  3. We are going to try to hold off on cortisone for  now and she is going      to treat herself      symptomatically with fluids and rest, aiming to keep her scheduled      appointment at the first of the year, earlier p.r.n.       Clinton D. Maple Hudson, MD, FCCP, FACP      CDY/MedQ  DD:  11/24/2005  DT:  11/26/2005  Job #:  [id number]

## 2010-07-02 NOTE — Assessment & Plan Note (Signed)
[facility]                               PULMONARY OFFICE NOTE   NAME:Bau, LOUNA ROTHGEB                      MRN:          [id number]  DATE:09/01/2005                            DOB:          03/29/1955    PROBLEM:  1.  Allergic rhinitis.  2.  Asthma.  3.  History of obstructive sleep apnea.   HISTORY:  She was skin test positive and wished to restart allergy vaccine  as of her visit in May.  She comes now early in build-up with no problems  with the vaccine.  She had called [place] back saying Doxycycline was not working  for sinus congestion and asking Levaquin, which was given.  She says now  that Clarinex D does not relieve her persistent head congestion.  Vivactil  had been used in the past for sleep apnea but she says it is not helping  because it cannot overcome the same head congestion complaint.  Breathe  Right strips did not help.  She has had no recent nasal steroid.  Feels some  pressure, headachy-discomfort across the maxillary and frontal areas with  nasal stuffiness, but is not blowing out anything purulent or bloody and  denies fever.   OBJECTIVE:  VITAL SIGNS:  Weight 133 pounds, which is up from 125.  Blood  pressure 178/98.  Pulse regular 123.  Room air saturation 100%.  HEENT:  There is increased periorbital edema and turbinate edema.  Posterior  pharynx may be minimally reddened but not impressive and certainly with no  post nasal drainage.  I do not find adenopathy.  Speech quality is normal.  LUNGS:  Clear.  HEART:  Heart sounds regular and normal.   IMPRESSION:  1.  Rhinitis.  Consider need for sinus computerized tomography.  2.  Obstructive sleep apnea, which should respond if we can provide adequate      relief of her rhinitis. I suspect she has both allergic and vasomotor      nasal congestion.   PLAN:  1.  Nebulizer inhalation Neo-Synephrine today.  2.  Refill Vivactil 10 mg h.s. to hold and restart if she begins  feeling      better.  3.  Try Veramyst once each nostril at bedtime.  4.  Entex LA one b.i.d. for 10 days.  5.  Continue vaccine build-up and schedule to return in 2 months, earlier as      needed.                                  Clinton D. Maple Hudson, MD, FCCP, FACP   CDY/MedQ  DD:  09/01/2005  DT:  09/02/2005  Job #:  [id number]

## 2010-07-02 NOTE — Patient Instructions (Signed)
Orders-- neb neo nasal                Depo 39  Ok to take Zyrtec with pseudoephedrine "Zyrtec-D" from pharmacy counter, for a few days at a time when needed

## 2010-07-02 NOTE — Assessment & Plan Note (Signed)
[name]                             PULMONARY OFFICE NOTE   NAME:Dominique Brock, Dominique Brock                      MRN:          [id number]  DATE:02/28/2006                            DOB:          03/04/1955    PRIMARY CARE PHYSICIAN:  Rosalyn Gess. Norins, M.D.   PULMONARY ALLERGY FOLLOW-UP:  1. Allergic rhinitis.  2. Asthma.  3. History of mild obstructive sleep apnea.   HISTORY:  Dominique Brock comes today for scheduled followup, saying Dominique Brock feels fine.  We commented that this is one of the few times I have seen her feeling  well.  We discussed her management and medications and had a long talk  about her obstructive sleep apnea, which was diagnosed in July of 2002  with an index of 15 per hour.  Dominique Brock had disliked C-PAP, which was more  disruptive than helpful.  Two brothers have sleep apnea and we discussed  familial features.  Dominique Brock has never been heavy and has usually been  thinner than this in her life.  Dominique Brock has been very satisfied with  Vivactil as a treatment for her sleep apnea and is not getting  complaints about snoring or recognizing significant daytime sleepiness,  so we are continuing that.   MEDICATION:  1. Multivitamins.  2. Aspirin 325 mg.  3. HCTZ.  4. Vivactil one half times 10 mg at h.s.  5. Atenolol.  6. P.r.n. use of an albuterol inhaler.  7. Dominique Brock has an Epipen.   DRUG INTOLERANCE:  ZITHROMAX with rash.  CODEINE, PENICILLIN, SEAFOOD.  Dominique Brock had stopped allergy vaccine about a year ago.   OBJECTIVE:  Weight 132 pounds, BP 136/78, pulse regular 88, room air  saturation 99%.  Dominique Brock is always puffy under the eyes.  There is no nasal  congestion or postnasal drip.  Tympanic membranes look normal.  Throat  is clear.  Lungs were clear.  Heart sounds regular, without murmur.   IMPRESSION:  1. Obstructive sleep apnea, adequately controlled on Vivactil 5 mg at      h.s.  We have again discussed conservative therapies and the      importance of being  alert to drive.  2. Allergic rhinitis, currently doing well.   PLAN:  1. Dominique Brock is going to continue saline nasal lavage, which has been      helpful recently.  2. Continue Vivactil.  3. Schedule return in six months, earlier p.r.n.     Clinton D. Maple Hudson, MD, Tonny Bollman, FACP  Electronically Signed    CDY/MedQ  DD: 02/28/2006  DT: 03/01/2006  Job #: [id number]   cc:   Rosalyn Gess. Norins, MD

## 2010-07-02 NOTE — Assessment & Plan Note (Addendum)
Acute exacerbation of rhinitis or nonbacterial rhinosinusitis.. We don't think she is infected yet. We will give neb and depo, then let her take Zyrtec D a few days at a time as needed.

## 2010-07-02 NOTE — Op Note (Signed)
NAMESIMONNE, BOULOS               ACCOUNT NO.:  [id number]   MEDICAL RECORD NO.:  [id number]          PATIENT TYPE:  AMB   LOCATION:  DSC                          FACILITY:  [facility]   PHYSICIAN:  Katy Fitch. Sypher, M.D. DATE OF BIRTH:  07-May-1955   DATE OF PROCEDURE:  07/13/2006  DATE OF DISCHARGE:                               OPERATIVE REPORT   PREOPERATIVE DIAGNOSES:  Painful firm mass right long finger ulnar  distal interphalangeal flexion crease, rule out glomus tumor.   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   OPERATIONS:  Marginal excision of mass right long finger ulnar proper  digital nerve.  Final diagnosis pending histopathologic evaluation of  biopsy specimen.   OPERATING SURGEON:  Katy Fitch. Sypher, M.D.   ASSISTANT:   ASSISTANT:  Annye Rusk PA-C   ANESTHESIA:  2% lidocaine metacarpal head level block of right long  finger supplemented by IV sedation.   SUPERVISING ANESTHESIOLOGIST:  Dr. Sampson Goon.   INDICATIONS:  This is a 55 year old woman referred for evaluation and  management of a painful mass in her right long finger at the DIP flexion  crease.   Clinical examination revealed a 6 x 4 mm mass consistent with a possible  subcutaneous glomus.   She is exquisitely sensitive to vibration and direct pressure.   We recommended excisional biopsy.   PROCEDURE:  The patient was brought to the operating room and placed in  the supine position on the operating table.   Following light sedation, a 2% lidocaine metacarpal head level block was  placed to obtain anesthesia in the right long finger.   After a few moments she had excellent anesthesia of the right long  finger.  The right arm was then prepped with Betadine soap solution and  sterilely draped.  The right long finger was exsanguinated with a gauze  wrap and a 1/2-inch Penrose drain was placed over  the proximal phalanx  as a digital tourniquet.   The procedure commenced with a Brunner zigzag incision exposing the  ulnar proper digital nerve and artery.   There was a purplish shiny mass directly within the epineurium of the  ulnar proper digital nerve.   Several of the fascicles were stretch taut over the mass.   Fine iris scissors and micro forceps were used to dissect the fascicles  off the mass.  The mass was shelled out although one of the fascicles  appeared to be directly involved with the mass.  A single fascicle was  sacrificed with the mass.  The mass was very friable and passed off for  pathologic evaluation.   Given the intraneural status of this mass and the inability to obtain a  margin without resecting the ulnar proper digital nerve there is a  significant chance that there will be a recurrence.   If there is a recurrence we will likely need to sacrifice the ulnar  proper digital nerve to obtain a margin.   We will pause in our treatment efforts until we have a more clear  idea  from the histopathology of the prognosis of this predicament.   The wound was inspected for bleeding points and subsequently repaired  with mattress sutures of 5-0 nylon.  The ulnar proper digital artery was  not disturbed.   There no apparent complications.  The patient tolerated the surgery and  anesthesia well.  She is transferred to recovery room with stable vital  signs.   I will see her back for follow-up in the office in a week or sooner  p.r.n. problems.  Will notify her once we have the results of her  pathology report.      Katy Fitch Sypher, M.D.  Electronically Signed     RVS/MEDQ  D:  07/13/2006  T:  07/13/2006  Job:  [id number]

## 2010-07-07 ENCOUNTER — Encounter: Payer: Self-pay | Admitting: Internal Medicine

## 2010-07-07 NOTE — Assessment & Plan Note (Signed)
Tonsils out. No longer told by her husband that she snores.

## 2010-08-03 ENCOUNTER — Telehealth: Payer: Self-pay

## 2010-08-03 NOTE — Telephone Encounter (Signed)
Patient called lmovm requesting a call back from nurse, did not mention why.

## 2010-09-20 ENCOUNTER — Telehealth: Payer: Self-pay | Admitting: Internal Medicine

## 2010-09-20 MED ORDER — PROTRIPTYLINE HCL 10 MG PO TABS: 10.0000 mg | ORAL_TABLET | Freq: Every day | ORAL | Status: DC

## 2010-09-20 NOTE — Telephone Encounter (Signed)
Spoke with pt and notified rx has been called to pharm. Pt verbalized understanding.

## 2010-09-20 NOTE — Telephone Encounter (Signed)
Ok to refill prn, # 30, with sig to take 1/2 -1 at bedtime for sleep apnea.

## 2010-09-20 NOTE — Telephone Encounter (Signed)
Please advise if okay to give refill-EMR records show directions as 1/2 tablet by mouth at bedtime for sleep apnea but EPIC records have directions as 1 tablet by mouth at bedtime. Thanks.

## 2010-12-27 ENCOUNTER — Telehealth: Payer: Self-pay | Admitting: Internal Medicine

## 2010-12-27 NOTE — Telephone Encounter (Signed)
Pt states Dominique Brock is on back order and [facility] is not able to fill same. Pt states at last fill she was shorted by 7 pills. Wants to know what CDY suggest to replace. Feels she can not go the night without this medication. Please advise, thanks.

## 2010-12-28 MED ORDER — IMIPRAMINE HCL 25 MG PO TABS: 25.0000 mg | ORAL_TABLET | Freq: Every day | ORAL | Status: DC

## 2010-12-28 NOTE — Telephone Encounter (Signed)
Spoke with pt and notified of recs per CDy- PT okay with trying this med and rx was sent to [facility]

## 2010-12-28 NOTE — Telephone Encounter (Signed)
Per CY-have patient try Imipramine 25 mg #15 take 1 tablet by mouth at bedtime. No refills.

## 2011-03-22 ENCOUNTER — Encounter: Payer: Self-pay | Admitting: *Deleted

## 2011-03-22 ENCOUNTER — Encounter: Payer: PRIVATE HEALTH INSURANCE | Attending: Endocrinology | Admitting: *Deleted

## 2011-03-22 DIAGNOSIS — E119 Type 2 diabetes mellitus without complications: Secondary | ICD-10-CM

## 2011-03-22 DIAGNOSIS — Z713 Dietary counseling and surveillance: Secondary | ICD-10-CM | POA: Insufficient documentation

## 2011-03-22 NOTE — Patient Instructions (Signed)
Plan: Aim for 2 Carbs per meal/ 0-1 per snack if hungry Read Food Labels for Total Carb and Fat grams Look for opportunities to increase activity 10-15 minutes in AM and/or swimming

## 2011-03-22 NOTE — Progress Notes (Signed)
  Medical Nutrition Therapy:  Appt start time: 0900 end time:  1000.   Assessment:  Primary concerns today: Patient here for nutrition counseling and diabetes education. She has strong family history of diabetes and commented several times on how uncontrolled her mother's diabetes is, which concerns her. 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 Eating habits are fairly appropriate, and she is already familiar with Carb Counting. She has a Editor, commissioning and checks her BG daily both pre and post meal times. Her highest BG to date was 154 @ 2 hours after a supper meal.   MEDICATIONS: see list. No diabetes medications yet   DIETARY INTAKE:  Usual eating pattern includes 3 meals and 2 snacks per day.  Everyday foods include good variety of all food groups.  Avoided foods include high calorie, fatty foods.    24-hr recall:  B ( AM): small Cheerios and Milk, OJ/ now 1 egg and [place] bacon and OJ, milk 2%  Snk ( AM): "Kind Bar" 200 cal, 16 g CHO, 15 g fat  L ( PM): go out- cafeteria- fish, slaw, water, or unswett tea, OR 6 nuggets, unsweet tea Snk ( PM): same as AM D ( PM): husband cooks- lean meat, fruit OR vegetables OR  Snk ( PM): none Beverages: water, unsweet tea, OJ, milk  Usual physical activity: none  Estimated energy needs: for weight loss and no physical activity 1200 calories 135 g carbohydrates 90 g protein 33 g fat  Progress Towards Goal(s):  In progress.   Nutritional Diagnosis:  NI-1.5 Excessive energy intake As related to weight management.  As evidenced by desired weight of 135 pounds.    Intervention:  Nutrition counseling and diabetes education provided. Taught patient basic physiology of diabetes, value of self monitoring her BG pre and post meal, meal plan including 30 grams per meal and 0-15 grams at each snack, calorie value of major nutrients, importance of including and  increase in activity on a regular basis Plan: Aim for 2 Carbs per meal/ 0-1 per snack if hungry Read Food Labels for Total Carb and Fat grams Look for opportunities to increase activity 10-15 minutes in AM and/or swimming  Handouts given during visit include:  Living Well with Diabetes  Carb Counting and Label Reading handouts  Menu Planning Card  Monitoring/Evaluation:  Dietary intake, exercise, reading food labels, and body weight prn.

## 2011-03-24 ENCOUNTER — Encounter: Payer: Self-pay | Admitting: Internal Medicine

## 2011-03-25 ENCOUNTER — Ambulatory Visit: Payer: PRIVATE HEALTH INSURANCE | Admitting: Internal Medicine

## 2011-05-04 ENCOUNTER — Encounter: Payer: Self-pay | Admitting: Internal Medicine

## 2011-05-04 ENCOUNTER — Ambulatory Visit (INDEPENDENT_AMBULATORY_CARE_PROVIDER_SITE_OTHER): Payer: PRIVATE HEALTH INSURANCE | Admitting: Internal Medicine

## 2011-05-04 VITALS — BP 122/88 | HR 66 | Ht 64.0 in | Wt 140.0 lb

## 2011-05-04 DIAGNOSIS — J45909 Unspecified asthma, uncomplicated: Secondary | ICD-10-CM

## 2011-05-04 DIAGNOSIS — J329 Chronic sinusitis, unspecified: Secondary | ICD-10-CM

## 2011-05-04 MED ORDER — PHENYLEPHRINE HCL 1 % NA SOLN
3.0000 [drp] | Freq: Once | NASAL | Status: AC
  Administered 2011-05-04: 3 [drp] via NASAL

## 2011-05-04 MED ORDER — METHYLPREDNISOLONE ACETATE 80 MG/ML IJ SUSP
80.0000 mg | Freq: Once | INTRAMUSCULAR | Status: AC
  Administered 2011-05-04: 80 mg via INTRAMUSCULAR

## 2011-05-04 MED ORDER — DOXYCYCLINE HYCLATE 100 MG PO TABS: ORAL_TABLET | ORAL | Status: AC

## 2011-05-04 NOTE — Progress Notes (Signed)
Patient ID: Dominique Brock, female    DOB: Jul 12, 1955, 56 y.o.   MRN: [id number]  HPI 07/02/10- 6 yo F never smoker followed for asthma, allergic rhinitis.  Last here March 24, 2010. Note reviewed. Got through FEB/ March and April ok. Just in the last 2 weeks, says her allergies have bothered her with pressure behind her eyes, right face fullness, no green and no sore throat or fever. Minor cough, no wheeze. She had tried coricidin, avoiding decongestants/ BP stimulants. She didn't find steroid nasal sprays helpful., and liked zyrtec better than Xyzal.   05/04/11- 56 yo F never smoker followed for asthma, allergic rhinitis. ACUTE VISIT- chills, head congestion x1week, cough, sore throat, nausea, ear pressure and slight sob. Tx w/ OTC Zyrtec-D- with no improvement. Ears itch. Feels stomach upset without nausea or vomiting. Chills for 2 days without definite fever. Nothing purulent.  ROS-see HPI Constitutional:   No-   weight loss, night sweats, fevers, chills, fatigue, lassitude. HEENT:   No-  headaches, difficulty swallowing, tooth/dental problems, sore throat,       No-  sneezing, itching, ear ache, nasal congestion, post nasal drip,  CV:  No-   chest pain, orthopnea, PND, swelling in lower extremities, anasarca, dizziness, palpitations Resp: No-   shortness of breath with exertion or at rest.              No-   productive cough,  No non-productive cough,  No- coughing up of blood.              No-   change in color of mucus.  No- wheezing.   Skin: No-   rash or lesions. GI:  No-   heartburn, indigestion, abdominal pain, nausea, vomiting, diarrhea,                 change in bowel habits, loss of appetite GU: No-   dysuria,  MS:  No-   joint pain or swelling. . Neuro-     nothing unusual Psych:  No- change in mood or affect. No depression or anxiety.  No memory loss.  OBJ- Physical Exam General- Alert, Oriented, Affect-appropriate, Distress- none acute Skin- rash-none, lesions- none,  excoriation- none Lymphadenopathy- none Head- atraumatic            Eyes- Gross vision intact, PERRLA, conjunctivae and secretions clear. Chronic periorbital edema.            Ears- Hearing, canals-normal            Nose- turbinate edema., no-Septal dev, mucus, polyps, erosion, perforation             Throat- Mallampati II , mucosa clear , drainage- none, tonsils- atrophic Neck- flexible , trachea midline, no stridor , thyroid nl, carotid no bruit Chest - symmetrical excursion , unlabored           Heart/CV- RRR , no murmur , no gallop  , no rub, nl s1 s2                           - JVD- none , edema- none, stasis changes- none, varices- none           Lung- clear to P&A, wheeze- none, cough- none , dullness-none, rub- none           Chest wall-  Abd-  Br/ Gen/ Rectal- Not done, not indicated Extrem- cyanosis- none, clubbing, none, atrophy- none, strength- nl Neuro-  grossly intact to observation

## 2011-05-04 NOTE — Patient Instructions (Signed)
Script sent for doxycycline  Neb neo nasal   Depo 80  Mucinex may help as a mucus thinner

## 2011-05-07 NOTE — Assessment & Plan Note (Signed)
Currently well controlled.  

## 2011-05-07 NOTE — Assessment & Plan Note (Signed)
The current episode is more consistent with infection than allergy but a combination is possible. Plan-nasal decongestant nebulizer,  Depo-Medrol, and doxycycline.

## 2011-05-07 NOTE — Assessment & Plan Note (Signed)
>>  ASSESSMENT AND PLAN FOR SINUSITIS, CHRONIC WRITTEN ON 05/07/2011  7:14 PM BY YOUNG, CLINTON D, MD  The current episode is more consistent with infection than allergy but a combination is possible. Plan-nasal decongestant nebulizer,  Depo-Medrol, and doxycycline.

## 2011-05-13 ENCOUNTER — Other Ambulatory Visit: Payer: Self-pay | Admitting: Internal Medicine

## 2011-05-16 NOTE — Telephone Encounter (Signed)
OK to refill vivactil

## 2011-05-16 NOTE — Telephone Encounter (Signed)
Please advise if okay to refill. Last seen 05-04-2011. Thanks.

## 2011-05-20 ENCOUNTER — Ambulatory Visit: Payer: PRIVATE HEALTH INSURANCE

## 2011-05-21 ENCOUNTER — Ambulatory Visit (INDEPENDENT_AMBULATORY_CARE_PROVIDER_SITE_OTHER): Payer: PRIVATE HEALTH INSURANCE | Admitting: Internal Medicine

## 2011-05-21 VITALS — BP 116/75 | HR 67 | Temp 98.0°F | Resp 18 | Ht 66.5 in | Wt 133.0 lb

## 2011-05-21 DIAGNOSIS — Z23 Encounter for immunization: Secondary | ICD-10-CM

## 2011-05-21 MED ORDER — TETANUS-DIPHTH-ACELL PERTUSSIS 5-2.5-18.5 LF-MCG/0.5 IM SUSP
0.5000 mL | Freq: Once | INTRAMUSCULAR | Status: AC
  Administered 2011-05-21: 0.5 mL via INTRAMUSCULAR

## 2011-05-21 NOTE — Progress Notes (Signed)
  Subjective:    Patient ID: Dominique Brock, female    DOB: October 14, 1955, 56 y.o.   MRN: [id number]  HPI  Ms. Quang is here for a Tdap today, she is expecting her first grandchild next week and requests immunization.  She is a pt on Dr. Maple Hudson who sees her for her asthma which is well controlled.  She did receive a prednisone shot two weeks ago but is not currently on oral steriods.  No previous history of any immunization reactions.    Review of Systems  All other systems reviewed and are negative.       Objective:   Physical Exam  Vitals reviewed. Constitutional: She appears well-developed and well-nourished.  HENT:  Head: Normocephalic.  Cardiovascular: Normal rate, regular rhythm and normal heart sounds.   Pulmonary/Chest: Effort normal and breath sounds normal.  Skin: Skin is warm and dry.  Psychiatric: She has a normal mood and affect. Her behavior is normal.          Assessment & Plan:  Tdap updated

## 2011-11-23 ENCOUNTER — Telehealth: Payer: Self-pay | Admitting: Internal Medicine

## 2011-11-23 MED ORDER — PREDNISONE 10 MG PO TABS: ORAL_TABLET | ORAL | Status: DC

## 2011-11-23 NOTE — Telephone Encounter (Signed)
Spoke with pt. She is c/o feeling like fluid is in her ears x 2 wks- "can hear myself chewing even".  She states that she also has had sinus pressure and HA for the past few days. Was taking zyrtec d and then mucinex d and things are worse rather than better. No cough, SOB, fever. Please advise, thanks! Allergies  Allergen Reactions  . Azithromycin     REACTION: rash  . Codeine   . Food     shellfish  . Penicillins

## 2011-11-23 NOTE — Telephone Encounter (Signed)
Per CDY- ok to call in pred taper 10 mg 4 x 2 days, 3 x 2 days, 2 x 2 days, 1 x 2 days, then stop  Spoke with pt and notified of this and she verbalized understanding Rx was sent to pharm.

## 2011-11-28 ENCOUNTER — Telehealth: Payer: Self-pay | Admitting: Internal Medicine

## 2011-11-28 MED ORDER — PREDNISONE 10 MG PO TABS: ORAL_TABLET | ORAL | Status: DC

## 2011-11-28 NOTE — Telephone Encounter (Signed)
I spoke with pt and she stated the prednisone was helping her get better until she got down to take 2 daily. She feels terrible. C/o lots of fluid in her ears-can hear herself chewing again, throat hurts, chest feels congestion, slight dry cough, and feels exhausted. Pt is requesting further recs.Please advise Dr. Maple Hudson thanks  Allergies  Allergen Reactions  . Azithromycin     REACTION: rash  . Codeine   . Food     shellfish  . Penicillins

## 2011-11-28 NOTE — Telephone Encounter (Signed)
Per CY-increase Prednisone 10 mg #40 take 40 mg daily x 7days, then 20 mg daily x 7 days then stop no refills. Pt is aware of Rx and will pick up today. Pt aware if no better to call our office.

## 2011-12-05 ENCOUNTER — Telehealth: Payer: Self-pay | Admitting: Internal Medicine

## 2011-12-05 NOTE — Telephone Encounter (Signed)
I spoke with pt and she c/o still having ear pain/pressure that is not going away. She is not able to sleep at night. The prednisone wears off by the end of the day and feels terrible. I asked pt if she has an ENT that could evaluate her ears since that is the only problem she is having. She stated she will call them and see if she can get in with her ENT. Nothing further was needed

## 2012-05-09 ENCOUNTER — Telehealth: Payer: Self-pay | Admitting: Internal Medicine

## 2012-05-09 NOTE — Telephone Encounter (Signed)
Ok this time, but I need to see her once a year for this, so she will also need a routine ov. She is now getting her allergy problems managed elsewhere. Would she rather get the provigil through her PCP, or rather still get it through me?

## 2012-05-09 NOTE — Telephone Encounter (Signed)
LMOVM-CY out of office this afternoon and will address this in the morning. If any questions or concerns to please call the office and ask for me.

## 2012-05-10 NOTE — Telephone Encounter (Signed)
I spoke with the Dominique Brock and she states that she would prefer to continue to see CY and did not realize that she had not been seen in over 1 year. Appt set to see CY on 05-15-12. Dominique Brock states to hold off on refill because she has some questions about medication. Carron Curie, CMA

## 2012-05-15 ENCOUNTER — Encounter: Payer: Self-pay | Admitting: Internal Medicine

## 2012-05-15 ENCOUNTER — Ambulatory Visit (INDEPENDENT_AMBULATORY_CARE_PROVIDER_SITE_OTHER): Payer: PRIVATE HEALTH INSURANCE | Admitting: Internal Medicine

## 2012-05-15 VITALS — BP 118/70 | HR 69 | Ht 64.0 in | Wt 127.4 lb

## 2012-05-15 DIAGNOSIS — G4733 Obstructive sleep apnea (adult) (pediatric): Secondary | ICD-10-CM

## 2012-05-15 DIAGNOSIS — J302 Other seasonal allergic rhinitis: Secondary | ICD-10-CM

## 2012-05-15 DIAGNOSIS — J309 Allergic rhinitis, unspecified: Secondary | ICD-10-CM

## 2012-05-15 MED ORDER — PROTRIPTYLINE HCL 5 MG PO TABS: 5.0000 mg | ORAL_TABLET | Freq: Every day | ORAL | Status: DC

## 2012-05-15 NOTE — Patient Instructions (Addendum)
Script refilling protriptylline 5 mg (Vivactil).  Try without for a week or so and see if it is really doing anything for you before spending the money to refill.  Please call as needed

## 2012-05-15 NOTE — Progress Notes (Signed)
Patient ID: Dominique Brock, female    DOB: 08-May-1955, 57 y.o.   MRN: [id number]  HPI 07/02/10- 87 yo F never smoker followed for asthma, allergic rhinitis.  Last here March 24, 2010. Note reviewed. Got through FEB/ March and April ok. Just in the last 2 weeks, says her allergies have bothered her with pressure behind her eyes, right face fullness, no green and no sore throat or fever. Minor cough, no wheeze. She had tried coricidin, avoiding decongestants/ BP stimulants. She didn't find steroid nasal sprays helpful., and liked zyrtec better than Xyzal.   05/04/11- 63 yo F never smoker followed for asthma, allergic rhinitis. ACUTE VISIT- chills, head congestion x1week, cough, sore throat, nausea, ear pressure and slight sob. Tx w/ OTC Zyrtec-D- with no improvement. Ears itch. Feels stomach upset without nausea or vomiting. Chills for 2 days without definite fever. Nothing purulent.  05/15/12-  16 yo F never smoker followed for asthma, allergic rhinitis, OSA. FOLLOWS FOR: still having flare ups of allergies-continues to use Sudafed OTC; Also would like to discuss getting another Rx for Vivactil (90 day supply) [facility] told pt that vivactil is no longer made. She is getting her allergy care elsewhere now. Diagnosed with diabetes, controlled with weight management. Seeing ENT for sinus balloon treatment because of chronic nasal pressure. She has not been told now that she snores and no longer has bad daytime tiredness.  ROS-see HPI Constitutional:   No-   weight loss, night sweats, fevers, chills, fatigue, lassitude. HEENT:   No-  headaches, difficulty swallowing, tooth/dental problems, sore throat,       No-  sneezing, itching, ear ache, +nasal congestion, post nasal drip,  CV:  No-   chest pain, orthopnea, PND, swelling in lower extremities, anasarca, dizziness, palpitations Resp: No-   shortness of breath with exertion or at rest.              No-   productive cough,  No non-productive  cough,  No- coughing up of blood.              No-   change in color of mucus.  No- wheezing.   Skin: No-   rash or lesions. GI:  No-   heartburn, indigestion, abdominal pain, nausea, vomiting,  GU: No-   dysuria,  MS:  No-   joint pain or swelling. . Neuro-     nothing unusual Psych:  No- change in mood or affect. No depression or anxiety.  No memory loss.  OBJ- Physical Exam General- Alert, Oriented, Affect-appropriate, Distress- none acute Skin- rash-none, lesions- none, excoriation- none Lymphadenopathy- none Head- atraumatic            Eyes- Gross vision intact, PERRLA, conjunctivae and secretions clear. +Chronic periorbital edema.            Ears- Hearing, canals-normal            Nose- +mildturbinate edema., no-Septal dev, mucus, polyps, erosion, perforation             Throat- Mallampati II-III , mucosa clear , drainage- none, tonsils- atrophic Neck- flexible , trachea midline, no stridor , thyroid nl, carotid no bruit Chest - symmetrical excursion , unlabored           Heart/CV- RRR , no murmur , no gallop  , no rub, nl s1 s2                           -  JVD- none , edema- none, stasis changes- none, varices- none           Lung- clear to P&A, wheeze- none, cough- none , dullness-none, rub- none           Chest wall-  Abd-  Br/ Gen/ Rectal- Not done, not indicated Extrem- cyanosis- none, clubbing, none, atrophy- none, strength- nl Neuro- grossly intact to observation

## 2012-05-23 NOTE — Assessment & Plan Note (Signed)
She reports no longer snoring and no longer daytime tiredness with some weight loss while dealing with elevated blood sugars. Plan-she did not tolerate CPAP but did find protriptyline useful. After discussion, we are refilling that to have on hand, but she will try without it.

## 2013-02-09 ENCOUNTER — Emergency Department (INDEPENDENT_AMBULATORY_CARE_PROVIDER_SITE_OTHER)
Admission: EM | Admit: 2013-02-09 | Discharge: 2013-02-09 | Disposition: A | Discharge status: Home / Self Care | Payer: PRIVATE HEALTH INSURANCE | Source: Home / Self Care | Attending: Emergency Medicine | Admitting: Emergency Medicine

## 2013-02-09 ENCOUNTER — Encounter (HOSPITAL_COMMUNITY): Payer: Self-pay | Admitting: Emergency Medicine

## 2013-02-09 DIAGNOSIS — J019 Acute sinusitis, unspecified: Secondary | ICD-10-CM

## 2013-02-09 MED ORDER — MUPIROCIN 2 % EX OINT: 1.0000 "application " | TOPICAL_OINTMENT | Freq: Three times a day (TID) | CUTANEOUS | Status: DC

## 2013-02-09 MED ORDER — METHYLPREDNISOLONE ACETATE 80 MG/ML IJ SUSP
INTRAMUSCULAR | Status: AC
  Filled 2013-02-09: qty 1

## 2013-02-09 MED ORDER — METHYLPREDNISOLONE ACETATE 80 MG/ML IJ SUSP
80.0000 mg | Freq: Once | INTRAMUSCULAR | Status: AC
  Administered 2013-02-09: 80 mg via INTRAMUSCULAR

## 2013-02-09 MED ORDER — PREDNISONE 20 MG PO TABS: ORAL_TABLET | ORAL | Status: DC

## 2013-02-09 NOTE — ED Provider Notes (Signed)
Chief Complaint   Chief Complaint  Patient presents with  . Facial Pain    History of Present Illness   Dominique Brock is a 57 year old female who has had a one-week history of nasal congestion with bloody, yellow drainage, sinus pressure, headache, ear congestion, sore throat, postnasal drip, hoarseness, and slight cough. She had a subjective fever and chills at onset of symptoms and some nausea, but this has passed. The patient went to see a primary care physician and was prescribed Omnicef and Sudafed this past Tuesday which was 5 days ago. She does not feel any better right now.  Review of Systems   Other than noted above, the patient denies any of the following symptoms: Systemic:  No fevers, chills, sweats, weight loss or gain, fatigue, or tiredness. Eye:  No redness or discharge. ENT:  No ear pain, drainage, headache, nasal congestion, drainage, sinus pressure, difficulty swallowing, or sore throat. Neck:  No neck pain or swollen glands. Lungs:  No cough, sputum production, hemoptysis, wheezing, chest tightness, shortness of breath or chest pain. GI:  No abdominal pain, nausea, vomiting or diarrhea.  PMFSH   Past medical history, family history, social history, meds, and allergies were reviewed. She is allergic to penicillin but apparently is able to take cephalosporins. She has type 2 diabetes, sleep apnea, and allergies.  Physical exam   Vital signs:  BP 143/87  Pulse 85  Temp(Src) 98 F (36.7 C) (Oral)  Resp 18  SpO2 99% General:  Alert and oriented.  In no distress.  Skin warm and dry. Eye:  No conjunctival injection or drainage. Lids were normal. ENT:  TMs and canals were normal, without erythema or inflammation.  Nasal mucosa was clear and uncongested, without drainage.  Mucous membranes were moist.  Pharynx was clear with no exudate or drainage.  There were no oral ulcerations or lesions. Neck:  Supple, no adenopathy, tenderness or mass. Lungs:  No respiratory  distress.  Lungs were clear to auscultation, without wheezes, rales or rhonchi.  Breath sounds were clear and equal bilaterally.  Heart:  Regular rhythm, without gallops, murmers or rubs. Skin:  Clear, warm, and dry, without rash or lesions.  Course in [facility]   She was given Depo-Medrol 80 mg IM.   Assessment     The encounter diagnosis was Acute sinusitis.  I don't think there is anything to be gained by switching to a different antibiotic. She's only been on it for about 5 days and I think she needs a little bit longer course. I did suggest that she try prednisone by mouth as well as injectable. She's also had some nosebleeds, and I suggested mupirocin ointment for prevention that problem.   Plan    1.  Meds:  The following meds were prescribed:   Discharge Medication List as of 02/09/2013 12:35 PM    START taking these medications   Details  mupirocin ointment (BACTROBAN) 2 % Apply 1 application topically 3 (three) times daily., Starting 02/09/2013, Until Discontinued, Normal    predniSONE (DELTASONE) 20 MG tablet 3 daily for 5 days, 2 daily for 5 days, 1 daily for 5 days, Normal        2.  Patient Education/Counseling:  The patient was given appropriate handouts, self care instructions, and instructed in symptomatic relief.  Instructed to get extra fluids, rest, and use a cool mist vaporizer.   3.  Follow up:  The patient was told to follow up here if no better in  3 to 4 days, or sooner if becoming worse in any way, and given some red flag symptoms such as increasing fever, difficulty breathing, chest pain, or persistent vomiting which would prompt immediate return.  Follow up here as needed.      Reuben Likes, MD 02/09/13 2228

## 2013-02-09 NOTE — ED Notes (Signed)
C/o sinus and facial pain, congestion since 12/21 has finished a RX for Cefdinir 300 mg one twice daily prescribed by RN at work, not any better

## 2013-05-15 ENCOUNTER — Ambulatory Visit (INDEPENDENT_AMBULATORY_CARE_PROVIDER_SITE_OTHER): Payer: Managed Care, Other (non HMO) | Admitting: Internal Medicine

## 2013-05-15 ENCOUNTER — Encounter: Payer: Self-pay | Admitting: Internal Medicine

## 2013-05-15 VITALS — BP 118/66 | HR 71 | Ht 64.0 in | Wt 131.0 lb

## 2013-05-15 DIAGNOSIS — J3089 Other allergic rhinitis: Secondary | ICD-10-CM

## 2013-05-15 DIAGNOSIS — J309 Allergic rhinitis, unspecified: Secondary | ICD-10-CM

## 2013-05-15 DIAGNOSIS — G4733 Obstructive sleep apnea (adult) (pediatric): Secondary | ICD-10-CM

## 2013-05-15 DIAGNOSIS — J302 Other seasonal allergic rhinitis: Secondary | ICD-10-CM

## 2013-05-15 MED ORDER — PROTRIPTYLINE HCL 5 MG PO TABS: 5.0000 mg | ORAL_TABLET | Freq: Every day | ORAL | Status: DC

## 2013-05-15 NOTE — Patient Instructions (Signed)
You can try a non-prescription "boil and bite" mouth piece from drug store, sold for snoring. See if you feel any more alert in the daytime if you wear it at night.  If you decide you want a new sleep study, call and we will see about setting up an unattended home-study.  Refill script for Vivactil

## 2013-05-15 NOTE — Progress Notes (Signed)
Patient ID: Dominique Brock, female    DOB: 1955/11/26, 58 y.o.   MRN: [id number]  HPI 07/02/10- 70 yo F never smoker followed for asthma, allergic rhinitis.  Last here March 24, 2010. Note reviewed. Got through FEB/ March and April ok. Just in the last 2 weeks, says her allergies have bothered her with pressure behind her eyes, right face fullness, no green and no sore throat or fever. Minor cough, no wheeze. She had tried coricidin, avoiding decongestants/ BP stimulants. She didn't find steroid nasal sprays helpful., and liked zyrtec better than Xyzal.   05/04/11- 42 yo F never smoker followed for asthma, allergic rhinitis. ACUTE VISIT- chills, head congestion x1week, cough, sore throat, nausea, ear pressure and slight sob. Tx w/ OTC Zyrtec-D- with no improvement. Ears itch. Feels stomach upset without nausea or vomiting. Chills for 2 days without definite fever. Nothing purulent.  05/15/12-  37 yo F never smoker followed for asthma, allergic rhinitis, OSA. FOLLOWS FOR: still having flare ups of allergies-continues to use Sudafed OTC; Also would like to discuss getting another Rx for Vivactil (90 day supply) [facility] told pt that vivactil is no longer made. She is getting her allergy care elsewhere now. Diagnosed with diabetes, controlled with weight management. Seeing ENT for sinus balloon treatment because of chronic nasal pressure. She has not been told now that she snores and no longer has bad daytime tiredness.  05/15/13-58 yo F never smoker followed for asthma, allergic rhinitis, OSA. Pt does not have any sleep assistance gear but is interested in a dental device.  Pt has no breathing or sleeping complaints at this time.  Still feels she needs vivactil to feel alert. She sleeps well and is no longer aware that snoring is an issue. Husband does not complain. NPSG 08/15/00 RDI/ AHI 15/ hr, weight 126 lbs.  ROS-see HPI Constitutional:   No-   weight loss, night sweats, fevers, chills,  fatigue, lassitude. HEENT:   No-  headaches, difficulty swallowing, tooth/dental problems, sore throat,       No-  sneezing, itching, ear ache, +nasal congestion, post nasal drip,  CV:  No-   chest pain, orthopnea, PND, swelling in lower extremities, anasarca, dizziness, palpitations Resp: No-   shortness of breath with exertion or at rest.              No-   productive cough,  No non-productive cough,  No- coughing up of blood.              No-   change in color of mucus.  No- wheezing.   Skin: No-   rash or lesions. GI:  No-   heartburn, indigestion, abdominal pain, nausea, vomiting,  GU:  MS:  No-   joint pain or swelling. . Neuro-     nothing unusual Psych:  No- change in mood or affect. No depression or anxiety.  No memory loss.  OBJ- Physical Exam General- Alert, Oriented, Affect-appropriate, Distress- none acute Skin- rash-none, lesions- none, excoriation- none Lymphadenopathy- none Head- atraumatic            Eyes- Gross vision intact, PERRLA, conjunctivae and secretions clear. +Chronic                                         periorbital edema/ fat.            Ears- Hearing, canals-normal  Nose- +mild bloody crust., no-Septal dev, mucus, polyps, erosion, perforation             Throat- Mallampati II-III , mucosa clear , drainage- none, tonsils- atrophic Neck- flexible , trachea midline, no stridor , thyroid nl, carotid no bruit Chest - symmetrical excursion , unlabored           Heart/CV- RRR , no murmur , no gallop  , no rub, nl s1 s2                           - JVD- none , edema- none, stasis changes- none, varices- none           Lung- clear to P&A, wheeze- none, cough- none , dullness-none, rub- none           Chest wall-  Abd-  Br/ Gen/ Rectal- Not done, not indicated Extrem- cyanosis- none, clubbing, none, atrophy- none, strength- nl Neuro- grossly intact to observation

## 2013-06-05 ENCOUNTER — Emergency Department (HOSPITAL_COMMUNITY)
Admission: EM | Admit: 2013-06-05 | Discharge: 2013-06-05 | Disposition: A | Discharge status: Home / Self Care | Payer: Managed Care, Other (non HMO) | Source: Home / Self Care | Attending: Emergency Medicine | Admitting: Emergency Medicine

## 2013-06-05 ENCOUNTER — Encounter (HOSPITAL_COMMUNITY): Payer: Self-pay | Admitting: Emergency Medicine

## 2013-06-05 DIAGNOSIS — J309 Allergic rhinitis, unspecified: Secondary | ICD-10-CM

## 2013-06-05 DIAGNOSIS — H6692 Otitis media, unspecified, left ear: Secondary | ICD-10-CM

## 2013-06-05 DIAGNOSIS — J019 Acute sinusitis, unspecified: Secondary | ICD-10-CM

## 2013-06-05 DIAGNOSIS — H6690 Otitis media, unspecified, unspecified ear: Secondary | ICD-10-CM

## 2013-06-05 MED ORDER — METHYLPREDNISOLONE ACETATE 80 MG/ML IJ SUSP
80.0000 mg | Freq: Once | INTRAMUSCULAR | Status: AC
  Administered 2013-06-05: 80 mg via INTRAMUSCULAR

## 2013-06-05 MED ORDER — METHYLPREDNISOLONE ACETATE 80 MG/ML IJ SUSP
INTRAMUSCULAR | Status: AC
  Filled 2013-06-05: qty 1

## 2013-06-05 MED ORDER — FLUTICASONE PROPIONATE 50 MCG/ACT NA SUSP: 2.0000 | Freq: Every day | NASAL | Status: DC

## 2013-06-05 MED ORDER — FEXOFENADINE-PSEUDOEPHED ER 60-120 MG PO TB12: 1.0000 | ORAL_TABLET | Freq: Two times a day (BID) | ORAL | Status: DC

## 2013-06-05 MED ORDER — DOXYCYCLINE HYCLATE 100 MG PO TABS: 100.0000 mg | ORAL_TABLET | Freq: Two times a day (BID) | ORAL | Status: DC

## 2013-06-05 MED ORDER — PREDNISONE 20 MG PO TABS: ORAL_TABLET | ORAL | Status: DC

## 2013-06-05 NOTE — Discharge Instructions (Signed)
People who suffer from allergies frequently have symptoms of nasal congestion, runny nose, sneezing, itching of the nose, eyes, ears or throat, mucous in the throat, watering of the eyes and cough.  These symptoms are caused by the body's immune response to environmental allergens.  For seasonal allergies this is pollen (tree pollen in the spring, grass pollen in the summer, and weed pollen in the fall).  Year round allergy symptoms are usually caused by dust or mould.  Many people have year round symptoms which are worse seasonally. ° °For people who have seasonal allergies, pollen avoidance may help to decease symptoms.  This means keeping windows in the house down and windows in the car up.  Run your air conditioning, since this filters out many of the pollen particles.  If you have to spend a prolonged time outdoors during heavy pollen season, it might be prudent to wear a mask.  These can be purchased at any drug store.  When you come in after heavy pollen exposure, your skin, clothing and hair are covered with pollen.  Changing your clothing, taking a shower, and washing your hair may help with your pollen exposure.  Also, your bedding, pillow, and pillowcase may become contaminated with pollen, so frequent washing of your bedding and pillowcase and changing out your pillow may help as well.  (Your pillow can also be a source of dust and mould exposure as well.)  Showering at bedtime may also help. ° °During heavy pollen season (April and September), a large amount of pollen gets trapped in your nasal cavity.  This can contribute to ongoing allergy symptoms.  Saline irrigation of the nasal cavity can help to remove this and relieve allergy symptoms.  This can be accomplished in several ways.  You can mix up your own saline solution using the following recipe:  8 oz of distilled or boiled water, 1/2 tsp of table salt (sodium choride), and a pinch of baking soda (sodium bicarbonate).  If nasal congestion is a  problem.  1 to 2 drops of Afrin solution can be added to this as well.  To do the irrigation, purchase a nasal bulb syringe (the kind you would use to clean out an infant's nose).  Fill this up with the solution, lean you head over a sink with the nostril to be irrigated turned upward, insert the syringe into your nostril, making a tight seal, and gently irrigate, compressing the bulb.  The solution will flow into your nostril and out the other, some may also come out of your mouth.  Repeat this on both sides.  You can do this once daily.  Do not store the solution, mix it up fresh each day.  A commercial solution, called Neomed Solution, can be purchased over the counter without prescription.  You can also use a Netti Pot for irrigation.  These can be purchased at your drug store as well.  Be sure to use distilled or boiled water in these as well and make sure the Netti pot is completely dry between uses. ° °Over the counter medications can be helpful, and in many cases can completely control allergy symptoms without resorting to more expensive prescription meds.   °Antihistamines are the mainstay of allergy treatment.  The newer non-sedating antihistamines are all available over the counter.  These include Allegra, Zyrtec, and Claritin which also can be purchased in their generic forms: fexofenadine, cetirizine, and  Cetirizine.  Combining these meds with a decongestant such as pseudoephedrine or   phenylephrine helps with nasal congestion, but decongestants can also cause elevations in blood pressure.  Pseudoephedrine tends to be more effective than phenylephrine.  The older, more sedating antihistamines such as chlorpheniramine, brompheniramine, and diphenhydramine are also very effective, sometimes more so than the newer antihistamines, but with the price of more sedation.  You should be careful about driving or operating heavy machinery when taking sedating antihistamines, and men with enlarged prostates may  experience urinary retention with diphenhydramine. ° °Naslacrom nasal spray can be very effective for allergy symptoms.  It is available over the counter and has very few side effects.  The dosage is 2 sprays in each nostril twice daily.  It is recommended that you pinch your nose shut for 30 seconds after using it since it is a watery spray and can run out.  It can be used as long as needed.  There is no risk of dependency. ° °For people with year round allergies, dust, mould, insect emanations, and pet dander are usually the culprits. ° °To avoid dust, you need to avoid dust mites which are the main source of allergens in house dust.  Cover your bedding with moisture and mite impervious covers.  These can be purchased at any mattress store.  The modern covers are a little expensive, but not at all uncomfortable. Keeping your house as dry as possible will also help to control dust mites.  Do not use a humidifier and it may help to use a dehumidifier.  Use of a HEPA filter air filter is also a great way to reduce dust and mold exposure.  These units can be purchased commercially.  Make sure to buy one large enough for the room you intend to use it.  Change the filter as per the manufacturer's instructions.  Also, using a HEPA filter vacuum for your carpets is helpful.  There are chemicals that you can sprinkle on your carpet called acaricides that will kill dist mites.  The most commonly used brand is Acarosan.  This can be purchased on line.  It does have to be periodically reapplied.  Wash you pillows and bedsheets regularly in hot water. ° ° ° ° ° ° ° ° ° °

## 2013-06-05 NOTE — ED Provider Notes (Signed)
Chief Complaint   Chief Complaint  Patient presents with  . Allergies    History of Present Illness   Dominique Brock is a 58 year old female who has a two-day history of nasal congestion with clear to yellow drainage, sneezing, itching of the nose, itchy watery eyes. She also has bilateral ear congestion, left facial and head pain, dry throat, postnasal drip, cough, and chills. She has a history of severe allergies. She's undergone testing and was found to be allergic to pollen. She has tried Zyrtec. She also has a history of asthma but states this is not bothering her right now. She denies any wheezing.  Review of Systems   Other than as noted above, the patient denies any of the following symptoms. Systemic:  No fever, chills, or headache. Eye:  No redness, itching, watering, pain or drainage. ENT:  No earache, ear congestion, sinus pressure or pain, post nasal drip, or sore throat. Lungs:  No cough, sputum production, wheezing, or shortness of breath. Skin:  No rash or itching.  [place]   Past medical history, family history, social history, meds, and allergies were reviewed.  She is allergic to penicillin and codeine. She takes Vivactil, atenolol, and a thyroid replacement. Current medical problems include high blood pressure, hypothyroidism, allergies, asthma, and prediabetes.  Physical Exam     Vital signs:  BP 140/84  Pulse 82  Temp(Src) 97.9 F (36.6 C) (Oral)  Resp 16  SpO2 100% General:  Alert, in no distress. Eye:  No conjunctival injection or drainage. Lids were normal. ENT:  The left TM was red and dull, right TM was normal, canals were clear.  Nasal mucosa was congested, pale and boggy with clear drainage.  Mucous membranes were moist.  Pharynx was erythematous, without exudate or drainage.  There were no oral ulcerations or lesions. Neck:  Supple, no adenopathy, tenderness or mass. Lungs:  No respiratory distress.  Lungs were clear to auscultation, without wheezes,  rales or rhonchi.  Breath sounds were clear and equal bilaterally. Heart:  Regular rhythm, without gallops, murmers or rubs. Skin:  Clear, warm, and dry, without rash or lesions.  Course [facility] [place] [facility]   Given Depo-Medrol 80 mg IM.  Assessment   The primary encounter diagnosis was Allergic rhinitis. Diagnoses of Acute sinusitis and Left otitis media were also pertinent to this visit.  Plan     1.  Meds:  The following meds were prescribed:   Discharge Medication List as of 06/05/2013  8:55 AM    START taking these medications   Details  doxycycline (VIBRA-TABS) 100 MG tablet Take 1 tablet (100 mg total) by mouth 2 (two) times daily., Starting 06/05/2013, Until Discontinued, Normal    fexofenadine-pseudoephedrine (ALLEGRA-D) 60-120 MG per tablet Take 1 tablet by mouth every 12 (twelve) hours., Starting 06/05/2013, Until Discontinued, Normal    fluticasone (FLONASE) 50 MCG/ACT nasal spray Place 2 sprays into both nostrils daily., Starting 06/05/2013, Until Discontinued, Normal    predniSONE (DELTASONE) 20 MG tablet Take 3 daily for 5 days, 2 daily for 5 days, 1 daily for 5 days., Normal        2.  Patient Education/Counseling:  The patient was given appropriate handouts, self care instructions, and instructed in symptomatic relief. The patient was instructed in allergen avoidance.    3.  Follow up:  The patient was told to follow up here if no better in 3 to 4 days, or sooner if becoming worse in any way, and given some  red flag symptoms such as fever or difficulty breathing which would prompt immediate return.         Harden Mo, MD 06/05/13 [phone]

## 2013-06-05 NOTE — ED Notes (Signed)
C/o  Sinus pressure and pain.  Sneezing.  Post nasal drip.  On set yesterday.  Taking zyrtec with no relief

## 2013-06-14 NOTE — Assessment & Plan Note (Signed)
She still feels Vivactil  helps her, but is not aware of snoring as a problem. The medication may be working as a stimulating antidepressant. It was once thought to reduce snoring. Plan-she will ask her husband how much he notices snoring

## 2013-06-14 NOTE — Assessment & Plan Note (Signed)
Being managed by allergist in [facility] on allergy shots

## 2013-06-25 ENCOUNTER — Other Ambulatory Visit: Payer: Self-pay | Admitting: Internal Medicine

## 2013-06-25 NOTE — Telephone Encounter (Signed)
Last Ov: 05-15-13 Next Ov: 05-16-14  Please advise if okay to refill. Thanks.

## 2013-06-25 NOTE — Telephone Encounter (Signed)
Ok to refill x 1 year 

## 2013-09-11 ENCOUNTER — Emergency Department (INDEPENDENT_AMBULATORY_CARE_PROVIDER_SITE_OTHER)
Admission: EM | Admit: 2013-09-11 | Discharge: 2013-09-11 | Disposition: A | Discharge status: Home / Self Care | Payer: 59 | Source: Home / Self Care | Attending: Family Medicine | Admitting: Family Medicine

## 2013-09-11 ENCOUNTER — Encounter (HOSPITAL_COMMUNITY): Payer: Self-pay | Admitting: Emergency Medicine

## 2013-09-11 DIAGNOSIS — J300 Vasomotor rhinitis: Secondary | ICD-10-CM

## 2013-09-11 DIAGNOSIS — J309 Allergic rhinitis, unspecified: Secondary | ICD-10-CM

## 2013-09-11 MED ORDER — IPRATROPIUM BROMIDE 0.06 % NA SOLN: 2.0000 | Freq: Four times a day (QID) | NASAL | Status: DC

## 2013-09-11 MED ORDER — PREDNISONE 50 MG PO TABS: ORAL_TABLET | ORAL | Status: DC

## 2013-09-11 NOTE — ED Notes (Signed)
Pt reports cold/sinus sx since yest Sx include: bilateral ear pain, facial pressure, ST, PND Denies fevers Alert w/no signs of acute distress.

## 2013-09-11 NOTE — ED Notes (Signed)
Went to d/c pt but was no where to be found Per Dr. Juventino Slovak... Pt was upset her visit took too long and left and d/c taking too long.

## 2013-09-11 NOTE — ED Provider Notes (Signed)
CSN: [id number]     Arrival date & time 09/11/13  1538 History   First MD Initiated Contact with Patient 09/11/13 1547     Chief Complaint  Patient presents with  . URI   (Consider location/radiation/quality/duration/timing/severity/associated sxs/prior Treatment) Patient is a 58 y.o. female presenting with URI. The history is provided by the patient.  URI Presenting symptoms: congestion and rhinorrhea   Presenting symptoms: no fever   Severity:  Moderate Onset quality:  Gradual Duration:  1 day Progression:  Unchanged Chronicity:  New Relieved by:  None tried Worsened by:  Nothing tried   Past Medical History  Diagnosis Date  . Diabetes mellitus   . Asthma   . Hypertension   . Sleep apnea    Past Surgical History  Procedure Laterality Date  . Abdominal hysterectomy    . Sinus exploration  1996  . Bilateral oophorectomy     Family History  Problem Relation Age of Onset  . Hypertension Mother   . Hyperlipidemia Mother   . Liver cancer Mother   . Allergies Mother   . Diabetes Mother   . Healthy Father   . Healthy      2 siblings   History  Substance Use Topics  . Smoking status: Never Smoker   . Smokeless tobacco: Never Used  . Alcohol Use: Yes     Comment: less than once a month   OB History   Grav Para Term Preterm Abortions TAB SAB Ect Mult Living                 Review of Systems  Constitutional: Negative.  Negative for fever.  HENT: Positive for congestion, postnasal drip and rhinorrhea.     Allergies  Azithromycin; Codeine; Food; and Penicillins  Home Medications   Prior to Admission medications   Medication Sig Start Date End Date Taking? Authorizing Provider  atenolol (TENORMIN) 25 MG tablet Take 25 mg by mouth daily.     Yes Historical Provider, MD  estradiol (VIVELLE-DOT) 0.1 MG/24HR Place 1 patch onto the skin. Wear patch x7 days, replace with a new one.   Yes Historical Provider, MD  doxycycline (VIBRA-TABS) 100 MG tablet Take 1 tablet  (100 mg total) by mouth 2 (two) times daily. 06/05/13   Harden Mo, MD  fexofenadine-pseudoephedrine (ALLEGRA-D) 60-120 MG per tablet Take 1 tablet by mouth every 12 (twelve) hours. 06/05/13   Harden Mo, MD  fluticasone (FLONASE) 50 MCG/ACT nasal spray Place 2 sprays into both nostrils daily. 06/05/13   Harden Mo, MD  ipratropium (ATROVENT) 0.06 % nasal spray Place 2 sprays into both nostrils 4 (four) times daily. 09/11/13   Billy Fischer, MD  levothyroxine (SYNTHROID, LEVOTHROID) 25 MCG tablet Take 25 mcg by mouth daily before breakfast.    Historical Provider, MD  mupirocin ointment (BACTROBAN) 2 % Apply 1 application topically 3 (three) times daily. 02/09/13   Harden Mo, MD  predniSONE (DELTASONE) 20 MG tablet Take 3 daily for 5 days, 2 daily for 5 days, 1 daily for 5 days. 06/05/13   Harden Mo, MD  predniSONE (DELTASONE) 50 MG tablet 1 tab daily for 2 days then 1/2 tab daily for 2 days. 09/11/13   Billy Fischer, MD  protriptyline (VIVACTIL) 5 MG tablet TAKE 1 TABLET BY MOUTH AT BEDTIME 06/25/13   Deneise Lever, MD   BP 141/88  Pulse 85  Temp(Src) 98.5 F (36.9 C) (Oral)  SpO2 100% Physical Exam  Nursing  note and vitals reviewed. Constitutional: She is oriented to person, place, and time. She appears well-developed and well-nourished. No distress.  HENT:  Head: Normocephalic.  Right Ear: External ear normal.  Left Ear: External ear normal.  Nose: Nose normal.  Mouth/Throat: Oropharynx is clear and moist.  Eyes: Conjunctivae are normal. Pupils are equal, round, and reactive to light.  Neck: Normal range of motion. Neck supple.  Lymphadenopathy:    She has no cervical adenopathy.  Neurological: She is alert and oriented to person, place, and time.  Skin: Skin is warm and dry.    ED Course  Procedures (including critical care time) Labs Review Labs Reviewed - No data to display  Imaging Review No results found.   MDM   1. Vasomotor rhinitis         Billy Fischer, MD 09/11/13 1623

## 2013-10-28 ENCOUNTER — Other Ambulatory Visit: Payer: Self-pay | Admitting: Obstetrics & Gynecology

## 2013-10-28 DIAGNOSIS — R928 Other abnormal and inconclusive findings on diagnostic imaging of breast: Secondary | ICD-10-CM

## 2013-11-04 ENCOUNTER — Ambulatory Visit
Admission: RE | Admit: 2013-11-04 | Discharge: 2013-11-04 | Disposition: A | Discharge status: Home / Self Care | Payer: 59 | Source: Ambulatory Visit | Attending: Obstetrics & Gynecology | Admitting: Obstetrics & Gynecology

## 2013-11-04 ENCOUNTER — Encounter (INDEPENDENT_AMBULATORY_CARE_PROVIDER_SITE_OTHER): Payer: Self-pay

## 2013-11-04 DIAGNOSIS — R928 Other abnormal and inconclusive findings on diagnostic imaging of breast: Secondary | ICD-10-CM

## 2014-05-16 ENCOUNTER — Encounter: Payer: Self-pay | Admitting: Internal Medicine

## 2014-05-16 ENCOUNTER — Ambulatory Visit (INDEPENDENT_AMBULATORY_CARE_PROVIDER_SITE_OTHER): Payer: 59 | Admitting: Internal Medicine

## 2014-05-16 ENCOUNTER — Encounter (INDEPENDENT_AMBULATORY_CARE_PROVIDER_SITE_OTHER): Payer: Self-pay

## 2014-05-16 VITALS — BP 122/82 | HR 78 | Ht 64.0 in | Wt 137.0 lb

## 2014-05-16 DIAGNOSIS — G4733 Obstructive sleep apnea (adult) (pediatric): Secondary | ICD-10-CM

## 2014-05-16 DIAGNOSIS — J302 Other seasonal allergic rhinitis: Secondary | ICD-10-CM

## 2014-05-16 DIAGNOSIS — J309 Allergic rhinitis, unspecified: Secondary | ICD-10-CM

## 2014-05-16 DIAGNOSIS — J3089 Other allergic rhinitis: Principal | ICD-10-CM

## 2014-05-16 MED ORDER — PROTRIPTYLINE HCL 5 MG PO TABS: 5.0000 mg | ORAL_TABLET | Freq: Every day | ORAL | Status: DC

## 2014-05-16 MED ORDER — AZELASTINE-FLUTICASONE 137-50 MCG/ACT NA SUSP: 1.0000 | Freq: Every evening | NASAL | Status: DC | PRN

## 2014-05-16 NOTE — Progress Notes (Signed)
Patient ID: Dominique Brock, female    DOB: 05/16/1955, 59 y.o.   MRN: [id number]  HPI 07/02/10- 19 yo F never smoker followed for asthma, allergic rhinitis.  Last here March 24, 2010. Note reviewed. Got through FEB/ March and April ok. Just in the last 2 weeks, says her allergies have bothered her with pressure behind her eyes, right face fullness, no green and no sore throat or fever. Minor cough, no wheeze. She had tried coricidin, avoiding decongestants/ BP stimulants. She didn't find steroid nasal sprays helpful., and liked zyrtec better than Xyzal.   05/04/11- 39 yo F never smoker followed for asthma, allergic rhinitis. ACUTE VISIT- chills, head congestion x1week, cough, sore throat, nausea, ear pressure and slight sob. Tx w/ OTC Zyrtec-D- with no improvement. Ears itch. Feels stomach upset without nausea or vomiting. Chills for 2 days without definite fever. Nothing purulent.  05/15/12-  71 yo F never smoker followed for asthma, allergic rhinitis, OSA. FOLLOWS FOR: still having flare ups of allergies-continues to use Sudafed OTC; Also would like to discuss getting another Rx for Vivactil (90 day supply) [facility] told pt that vivactil is no longer made. She is getting her allergy care elsewhere now. Diagnosed with diabetes, controlled with weight management. Seeing ENT for sinus balloon treatment because of chronic nasal pressure. She has not been told now that she snores and no longer has bad daytime tiredness.  05/15/13-58 yo F never smoker followed for asthma, allergic rhinitis, OSA. Pt does not have any sleep assistance gear but is interested in a dental device.  Pt has no breathing or sleeping complaints at this time.  Still feels she needs vivactil to feel alert. She sleeps well and is no longer aware that snoring is an issue. Husband does not complain. NPSG 08/15/00 RDI/ AHI 15/ hr, weight 126 lbs.  05/16/14- 31 yo F never smoker followed for asthma, allergic rhinitis, OSA FOLLOWS FOR:  Pt denies any current issues with sleep. Pt does not use any type of sleep aid.  Still using Vivactil successfully at night and wishes to continue this. She feels better off with it and does not find it bothering her sleep. Blames pollen allergy season for uncomfortable pressure in her ears. No sneezing and no chest discomfort. Using Zyrtec +12 hour Sudafed 120 mg each morning  ROS-see HPI Constitutional:   No-   weight loss, night sweats, fevers, chills, fatigue, lassitude. HEENT:   No-  headaches, difficulty swallowing, tooth/dental problems, sore throat,       No-  sneezing, itching, +ear ache, +nasal congestion, post nasal drip,  CV:  No-   chest pain, orthopnea, PND, swelling in lower extremities, anasarca, dizziness, palpitations Resp: No-   shortness of breath with exertion or at rest.              No-   productive cough,  No non-productive cough,  No- coughing up of blood.              No-   change in color of mucus.  No- wheezing.   Skin: No-   rash or lesions. GI:  No-   heartburn, indigestion, abdominal pain, nausea, vomiting,  GU:  MS:  No-   joint pain or swelling. . Neuro-     nothing unusual Psych:  No- change in mood or affect. No depression or anxiety.  No memory loss.  OBJ- Physical Exam General- Alert, Oriented, Affect-appropriate, Distress- none acute Skin- rash-none, lesions- none, excoriation- none  Lymphadenopathy- none Head- atraumatic            Eyes- Gross vision intact, PERRLA, conjunctivae and secretions clear. +Chronic  periorbital edema/ fat.            Ears- Hearing, canals-normal            Nose- clear, no-Septal dev, mucus, polyps, erosion, perforation             Throat- Mallampati II-III , mucosa clear , drainage- none, tonsils- atrophic Neck- flexible , trachea midline, no stridor , thyroid nl, carotid no bruit Chest - symmetrical excursion , unlabored           Heart/CV- RRR , no murmur , no gallop  , no rub, nl s1 s2                           - JVD-  none , edema- none, stasis changes- none, varices- none           Lung- clear to P&A, wheeze- none, cough- none , dullness-none, rub- none           Chest wall-  Abd-  Br/ Gen/ Rectal- Not done, not indicated Extrem- cyanosis- none, clubbing, none, atrophy- none, strength- nl Neuro- grossly intact to observation

## 2014-05-16 NOTE — Patient Instructions (Signed)
Script sent for Vivactil refill  Ok to use Zyrtec and or Sudafed as needed  Sample Dymista nasal spray   1-2 puffs each nostril once daily at bedtime  Ok to try a nasal steamer as needed

## 2014-05-20 ENCOUNTER — Telehealth: Payer: Self-pay | Admitting: Internal Medicine

## 2014-05-20 MED ORDER — AZELASTINE-FLUTICASONE 137-50 MCG/ACT NA SUSP: 1.0000 | Freq: Every evening | NASAL | Status: DC | PRN

## 2014-05-20 NOTE — Telephone Encounter (Signed)
Left message for patient stating that we have taken care of this request and med sent to pharmacy-if anything further needed pt to contact the office.

## 2014-05-26 ENCOUNTER — Telehealth: Payer: Self-pay | Admitting: Internal Medicine

## 2014-05-26 MED ORDER — PREDNISONE 10 MG PO TABS: ORAL_TABLET | ORAL | Status: DC

## 2014-05-26 NOTE — Telephone Encounter (Signed)
Spoke with pt, aware of recs.  pred taper sent in.  Nothing further needed.

## 2014-05-26 NOTE — Telephone Encounter (Signed)
Patient says she is not getting any better.  Taking Sudafed and Zyrtec.  Ears are hurting, full, itching.  Throat is sore, coughing.  Feels extremely fatigued.  No energy at all.    Current Outpatient Prescriptions on File Prior to Visit  Medication Sig Dispense Refill  . atenolol (TENORMIN) 25 MG tablet Take 25 mg by mouth daily.      . Azelastine-Fluticasone (DYMISTA) 137-50 MCG/ACT SUSP Place 1-2 puffs into the nose at bedtime as needed. 1 Bottle 11  . cetirizine (ZYRTEC) 10 MG tablet Take 10 mg by mouth daily.    Marland Kitchen estradiol (VIVELLE-DOT) 0.1 MG/24HR Place 1 patch onto the skin. Wear patch x7 days, replace with a new one.    . levothyroxine (SYNTHROID, LEVOTHROID) 25 MCG tablet Take 25 mcg by mouth daily before breakfast.    . protriptyline (VIVACTIL) 5 MG tablet Take 1 tablet (5 mg total) by mouth at bedtime. 90 tablet 3  . pseudoephedrine (SUDAFED) 120 MG 12 hr tablet Take 120 mg by mouth 2 (two) times daily.    . [DISCONTINUED] hydrochlorothiazide 25 MG tablet Take 25 mg by mouth daily.      . [DISCONTINUED] imipramine (TOFRANIL) 25 MG tablet Take 1 tablet (25 mg total) by mouth at bedtime. 15 tablet 0  . [DISCONTINUED] progesterone (PROMETRIUM) 100 MG capsule Take 100 mg by mouth daily.       No current facility-administered medications on file prior to visit.   Allergies  Allergen Reactions  . Azithromycin     REACTION: rash  . Codeine   . Food     shellfish  . Penicillins

## 2014-05-26 NOTE — Telephone Encounter (Signed)
Suggest prednisone 10 mg # 12, 2 daily x 4 days, then one daily x 4 days, then stop

## 2014-05-27 NOTE — Assessment & Plan Note (Signed)
She indicates she is not working with the allergist in [facility] now. Describing eustachian dysfunction. Plan-try Dymista nasal spray with discussion

## 2014-05-27 NOTE — Assessment & Plan Note (Signed)
At one time, Dominique Brock was thought to help reduce I'll sleep apnea. It tends to be stimulating and was used in the morning as an antidepressant.. She has chosen to continue it and has missed it when she tried without. It may be the antidepressant effect that she is seeking, but I have no problem with continuing it.

## 2014-07-17 ENCOUNTER — Telehealth: Payer: Self-pay | Admitting: Internal Medicine

## 2014-07-17 DIAGNOSIS — G4733 Obstructive sleep apnea (adult) (pediatric): Secondary | ICD-10-CM

## 2014-07-17 NOTE — Telephone Encounter (Signed)
Spoke with pt and advised of Dr Janee Morn recommendations.  Order placed .

## 2014-07-17 NOTE — Telephone Encounter (Signed)
Spoke with pt.  She states husband reports increase in snoring for past month.  Pt also c/o increased fatigue in am's and also "losing words" mid sentence.  Pt still taking Vivactil @ hs.  Pt states that she tried cpap back in 2006 when first diagnosed  But did not tolerate but is willing to try it again if Dr Annamaria Boots agrees.  Pt understands that she may need another sleep study .  Please advise

## 2014-07-17 NOTE — Telephone Encounter (Signed)
Her last study was in 2002, se we won't be able to do anything without an update of this.  Please order split protocol NPSG, dx OSA

## 2014-07-18 LAB — HM DIABETES EYE EXAM

## 2014-08-25 ENCOUNTER — Telehealth: Payer: Self-pay | Admitting: Internal Medicine

## 2014-08-25 MED ORDER — PREDNISONE 10 MG PO TABS: ORAL_TABLET | ORAL | Status: DC

## 2014-08-25 MED ORDER — DOXYCYCLINE HYCLATE 100 MG PO TABS: ORAL_TABLET | ORAL | Status: DC

## 2014-08-25 NOTE — Telephone Encounter (Signed)
Pt c/o chills, body aches, head/ears and throat ache x 2 days.  Pt c/o dark green mucus, nose bleeds, hoarseness and chest congestion this morning.  Pt has been using Sudafed OTC and Allegra.  Denies fever.   Requests rec's - aware that Dr Annamaria Boots is not in office and patient refuses to see anyone but CY.  Allergies  Allergen Reactions  . Azithromycin     REACTION: rash  . Codeine   . Food     shellfish  . Penicillins    Please advise Dr Annamaria Boots. Thanks.

## 2014-08-25 NOTE — Telephone Encounter (Signed)
Pt aware of rec's per CY Doxy 100mg  and Pred taper sent to pharmacy.  Nothing further needed.

## 2014-08-25 NOTE — Telephone Encounter (Signed)
Suggest doxycycline 1000 mg, # 8, 2 today then one daily If she thinks she needs it we can refill the prednisone taper on her med list

## 2014-09-11 ENCOUNTER — Ambulatory Visit (INDEPENDENT_AMBULATORY_CARE_PROVIDER_SITE_OTHER): Payer: 59 | Admitting: Internal Medicine

## 2014-09-11 ENCOUNTER — Encounter: Payer: Self-pay | Admitting: Internal Medicine

## 2014-09-11 VITALS — BP 106/68 | HR 79 | Ht 65.0 in | Wt 142.0 lb

## 2014-09-11 DIAGNOSIS — J209 Acute bronchitis, unspecified: Secondary | ICD-10-CM

## 2014-09-11 DIAGNOSIS — J329 Chronic sinusitis, unspecified: Secondary | ICD-10-CM

## 2014-09-11 MED ORDER — CEFDINIR 300 MG PO CAPS: 300.0000 mg | ORAL_CAPSULE | Freq: Two times a day (BID) | ORAL | Status: DC

## 2014-09-11 MED ORDER — PHENYLEPHRINE HCL 1 % NA SOLN
3.0000 [drp] | Freq: Once | NASAL | Status: AC
  Administered 2014-09-11: 3 [drp] via NASAL

## 2014-09-11 NOTE — Patient Instructions (Addendum)
Neb neo nasal  Script sent for cefdinir antibiotic  Ok to use Mucinex or Mucinex-DM, and Sudafed as needed  Keep appointment for sleep study as planned. We will need to discuss those results.

## 2014-09-11 NOTE — Progress Notes (Signed)
Patient ID: Dominique Brock, female    DOB: 02/21/1955, 59 y.o.   MRN: [id number]  HPI 07/02/10- 78 yo F never smoker followed for asthma, allergic rhinitis.  Last here March 24, 2010. Note reviewed. Got through FEB/ March and April ok. Just in the last 2 weeks, says her allergies have bothered her with pressure behind her eyes, right face fullness, no green and no sore throat or fever. Minor cough, no wheeze. She had tried coricidin, avoiding decongestants/ BP stimulants. She didn't find steroid nasal sprays helpful., and liked zyrtec better than Xyzal.   05/04/11- 46 yo F never smoker followed for asthma, allergic rhinitis. ACUTE VISIT- chills, head congestion x1week, cough, sore throat, nausea, ear pressure and slight sob. Tx w/ OTC Zyrtec-D- with no improvement. Ears itch. Feels stomach upset without nausea or vomiting. Chills for 2 days without definite fever. Nothing purulent.  05/15/12-  23 yo F never smoker followed for asthma, allergic rhinitis, OSA. FOLLOWS FOR: still having flare ups of allergies-continues to use Sudafed OTC; Also would like to discuss getting another Rx for Vivactil (90 day supply) [facility] told pt that vivactil is no longer made. She is getting her allergy care elsewhere now. Diagnosed with diabetes, controlled with weight management. Seeing ENT for sinus balloon treatment because of chronic nasal pressure. She has not been told now that she snores and no longer has bad daytime tiredness.  05/15/13-58 yo F never smoker followed for asthma, allergic rhinitis, OSA. Pt does not have any sleep assistance gear but is interested in a dental device.  Pt has no breathing or sleeping complaints at this time.  Still feels she needs vivactil to feel alert. She sleeps well and is no longer aware that snoring is an issue. Husband does not complain. NPSG 08/15/00 RDI/ AHI 15/ hr, weight 126 lbs.  05/16/14- 89 yo F never smoker followed for asthma, allergic rhinitis, OSA FOLLOWS FOR:  Pt denies any current issues with sleep. Pt does not use any type of sleep aid.  Still using Vivactil successfully at night and wishes to continue this. She feels better off with it and does not find it bothering her sleep. Blames pollen allergy season for uncomfortable pressure in her ears. No sneezing and no chest discomfort. Using Zyrtec +12 hour Sudafed 120 mg each morning  09/11/14- 23 yo F never smoker followed for asthma, allergic rhinitis, OSA ACUTE - nasal congestion/drainage, left ear pain, Nonproductive cough Got chilled at a theater. We called in doxycycline and prednisone. Dry cough now worse. Clear postnasal drip. Antibiotic bothering stomach. Left ear pressure. Sore throat  ROS-see HPI Constitutional:   No-   weight loss, night sweats, fevers, ., fatigue, lassitude. HEENT:   No-  headaches, difficulty swallowing, tooth/dental problems, sore throat,       No-  sneezing, itching, +ear ache, +nasal congestion, post nasal drip,  CV:  No-   chest pain, orthopnea, PND, swelling in lower extremities, anasarca, dizziness, palpitations Resp: No-   shortness of breath with exertion or at rest.              No-   productive cough,  No non-productive cough,  No- coughing up of blood.              No-   change in color of mucus.  No- wheezing.   Skin: No-   rash or lesions. GI:  No-   heartburn, indigestion, abdominal pain, nausea, vomiting,  GU:  MS:  No-   joint pain or swelling. . Neuro-     nothing unusual Psych:  No- change in mood or affect. No depression or anxiety.  No memory loss.  OBJ- Physical Exam General- Alert, Oriented, Affect-appropriate, Distress- none acute Skin- rash-none, lesions- none, excoriation- none Lymphadenopathy- none Head- atraumatic            Eyes- Gross vision intact, PERRLA, conjunctivae and secretions clear. +Chronic  periorbital edema/ fat.            Ears- Hearing, canals-normal, + left TM retracted            Nose- clear, no-Septal dev, mucus,  polyps, erosion, perforation             Throat- Mallampati III , mucosa clear , drainage- none, tonsils- atrophic, + hoarse Neck- flexible , trachea midline, no stridor , thyroid nl, carotid no bruit Chest - symmetrical excursion , unlabored           Heart/CV- RRR , no murmur , no gallop  , no rub, nl s1 s2                           - JVD- none , edema- none, stasis changes- none, varices- none           Lung- clear to P&A, wheeze- none, cough + dry , dullness-none, rub- none           Chest wall-  Abd-  Br/ Gen/ Rectal- Not done, not indicated Extrem- cyanosis- none, clubbing, none, atrophy- none, strength- nl Neuro- grossly intact to observation

## 2014-10-03 ENCOUNTER — Ambulatory Visit (HOSPITAL_BASED_OUTPATIENT_CLINIC_OR_DEPARTMENT_OTHER): Payer: 59 | Attending: Internal Medicine | Admitting: Radiology

## 2014-10-03 VITALS — Ht 65.0 in | Wt 140.0 lb

## 2014-10-03 DIAGNOSIS — G4733 Obstructive sleep apnea (adult) (pediatric): Secondary | ICD-10-CM | POA: Diagnosis present

## 2014-10-03 DIAGNOSIS — R0683 Snoring: Secondary | ICD-10-CM | POA: Insufficient documentation

## 2014-10-04 DIAGNOSIS — G4733 Obstructive sleep apnea (adult) (pediatric): Secondary | ICD-10-CM | POA: Diagnosis not present

## 2014-10-04 NOTE — Progress Notes (Signed)
  Patient Name: Dominique Brock, Dominique Brock Date: 10/03/2014 Gender: Female D.O.B: [birth year] Age (years): 35 Referring Provider: Baird Lyons MD, ABSM Height (inches): 65 Interpreting Physician: Baird Lyons MD, ABSM Weight (lbs): 140 RPSGT: Laren Everts BMI: 23 MRN: [id number] Neck Size: 13.00 CLINICAL INFORMATION Sleep Study Type: NPSG  Indication for sleep study: Diabetes, Fatigue, OSA, Re-Evaluation, Snoring, Witnessed Apneas  Epworth Sleepiness Score: 14  SLEEP STUDY TECHNIQUE As per the AASM Manual for the Scoring of Sleep and Associated Events v2.3 (April 2016) with a hypopnea requiring 4% desaturations.  The channels recorded and monitored were frontal, central and occipital EEG, electrooculogram (EOG), submentalis EMG (chin), nasal and oral airflow, thoracic and abdominal wall motion, anterior tibialis EMG, snore microphone, electrocardiogram, and pulse oximetry.  MEDICATIONS Patient's medications include: charted for review Medications self-administered by patient during sleep study : levothyroxine at [place] The study was initiated at 9:53:50 PM and ended at 4:59:45 AM.  Sleep onset time was 62.0 minutes and the sleep efficiency was 55.4%. The total sleep time was 236.0 minutes.  Stage REM latency was 92.5 minutes.  The patient spent 15.88% of the night in stage N1 sleep, 64.42% in stage N2 sleep, 0.00% in stage N3 and 19.71% in REM.  Alpha intrusion was absent.  Supine sleep was 34.31%.  RESPIRATORY PARAMETERS The overall apnea/hypopnea index (AHI) was 0.3 per hour. There were 0 total apneas, including 0 obstructive, 0 central and 0 mixed apneas. There were 1 hypopneas and 22 RERAs.  The AHI during Stage REM sleep was 1.3 per hour.  AHI while supine was 0.0 per hour.  The mean oxygen saturation was 94.60%. The minimum SpO2 during sleep was 92.00%.  Moderate snoring was noted during this study.  CARDIAC DATA The 2 lead EKG  demonstrated sinus rhythm. The mean heart rate was 62.64 beats per minute. Other EKG findings include: None.  LEG MOVEMENT DATA The total PLMS were 0 with a resulting PLMS index of 0.00. Associated arousal with leg movement index was 0.0 .  IMPRESSIONS No significant obstructive sleep apnea occurred during this study (AHI = 0.3/h). No significant central sleep apnea occurred during this study (CAI = 0.0/h). The patient had minimal or no oxygen desaturation during the study (Min O2 = 92.00%) The patient snored with Moderate snoring volume. No cardiac abnormalities were noted during this study. Clinically significant periodic limb movements did not occur during sleep. No significant associated arousals.   DIAGNOSIS Primary Snoring (786.09 [R06.83 ICD-10]) Normal study  RECOMMENDATIONS Avoid alcohol, sedatives and other CNS depressants that may worsen sleep apnea and disrupt normal sleep architecture. Sleep hygiene should be reviewed to assess factors that may improve sleep quality. Weight management and regular exercise should be initiated or continued if appropriate.  Deneise Lever Diplomate, American Board of Sleep Medicine  ELECTRONICALLY SIGNED ON:  10/04/2014, 2:36 PM [place] PH: [phone]   FX: [phone] [place]

## 2014-10-23 NOTE — Assessment & Plan Note (Signed)
Acute upper respiratory infection with bronchitis Plan-Cefdinir, nasal nebulizer decongestant, probiotic, Flonase, Sudafed

## 2014-12-12 ENCOUNTER — Telehealth: Payer: Self-pay | Admitting: Internal Medicine

## 2014-12-12 MED ORDER — PREDNISONE 10 MG PO TABS: ORAL_TABLET | ORAL | Status: DC

## 2014-12-12 MED ORDER — DOXYCYCLINE HYCLATE 100 MG PO TABS: 100.0000 mg | ORAL_TABLET | Freq: Two times a day (BID) | ORAL | Status: DC

## 2014-12-12 NOTE — Telephone Encounter (Signed)
Spoke with pt, aware of recs.  meds sent to preferred pharmacy.  Nothing further needed.

## 2014-12-12 NOTE — Telephone Encounter (Signed)
Called spoke with pt. She c/o facial pain, ear pain, HA, sore throat, blowing out (yellow-green phlem), PND. Pt has been taking sudafed and allegra. Wants doxy and prednisone called in. CDY off this afternoon Please advise MW thanks

## 2014-12-12 NOTE — Telephone Encounter (Signed)
Doxy 100 mg bid x 10 days Prednisone 10 mg take  4 each am x 2 days,   2 each am x 2 days,  1 each am x 2 days and stop  

## 2015-02-04 ENCOUNTER — Telehealth: Payer: Self-pay | Admitting: Internal Medicine

## 2015-02-04 NOTE — Telephone Encounter (Signed)
Pt is aware of CY's recommendations. Nothing further was needed at this time. 

## 2015-02-04 NOTE — Telephone Encounter (Signed)
Spoke with pt. States that her allergies are getting worse. Reports ear pain, coughing, scratchy throat, headache and PND. Has been taking Mucinex, Allegra and Sudafed OTC. Onset was 1 week ago. Would like CY's recommendations. CY - please advise. Thanks.  Allergies  Allergen Reactions  . Azithromycin     REACTION: rash  . Codeine   . Food     shellfish  . Penicillins    Current Outpatient Prescriptions on File Prior to Visit  Medication Sig Dispense Refill  . atenolol (TENORMIN) 25 MG tablet Take 25 mg by mouth daily.      . Azelastine-Fluticasone (DYMISTA) 137-50 MCG/ACT SUSP Place 1-2 puffs into the nose at bedtime as needed. 1 Bottle 11  . cefdinir (OMNICEF) 300 MG capsule Take 1 capsule (300 mg total) by mouth 2 (two) times daily. 14 capsule 0  . doxycycline (VIBRA-TABS) 100 MG tablet Take 1 tablet (100 mg total) by mouth 2 (two) times daily. 20 tablet 0  . estradiol (VIVELLE-DOT) 0.1 MG/24HR Place 1 patch onto the skin. Wear patch x7 days, replace with a new one.    . levothyroxine (SYNTHROID, LEVOTHROID) 25 MCG tablet Take 25 mcg by mouth daily before breakfast.    . predniSONE (DELTASONE) 10 MG tablet 40mg  X2 days, 30mg  X2 days, 20mg  X2 days, 10mg  X2 days, then stop. 20 tablet 0  . protriptyline (VIVACTIL) 5 MG tablet Take 1 tablet (5 mg total) by mouth at bedtime. 90 tablet 3  . pseudoephedrine (SUDAFED) 120 MG 12 hr tablet Take 120 mg by mouth 2 (two) times daily.    . [DISCONTINUED] hydrochlorothiazide 25 MG tablet Take 25 mg by mouth daily.      . [DISCONTINUED] imipramine (TOFRANIL) 25 MG tablet Take 1 tablet (25 mg total) by mouth at bedtime. 15 tablet 0  . [DISCONTINUED] progesterone (PROMETRIUM) 100 MG capsule Take 100 mg by mouth daily.       No current facility-administered medications on file prior to visit.

## 2015-02-04 NOTE — Telephone Encounter (Signed)
Most of what we are seeing now is viral respiratory infections  Recommend liberal hydration with fluids. Ok to use otc comfort meds like "cold and flu" products I dn't think there is a role yet for antibiotics.

## 2015-02-17 ENCOUNTER — Telehealth: Payer: Self-pay | Admitting: Internal Medicine

## 2015-02-17 MED ORDER — CEFDINIR 300 MG PO CAPS: 300.0000 mg | ORAL_CAPSULE | Freq: Two times a day (BID) | ORAL | Status: DC

## 2015-02-17 NOTE — Telephone Encounter (Signed)
lmtcb x1 for pt. 

## 2015-02-17 NOTE — Telephone Encounter (Signed)
Spoke with pt. States that CY has given this to her in the past. Rx has been sent in. Nothing further was needed.

## 2015-02-17 NOTE — Telephone Encounter (Signed)
See if she can tolerate cefdinir 300 mg, # 20, 1 twice daily  Only about 1 in 10 people who are allergic to penicillin will react to this. It should have good spectrum of coverage for sinus infections

## 2015-02-17 NOTE — Telephone Encounter (Signed)
Pt c/o cough, scratchy/sore throat, green mucus which is bloody at times, left ear pain and SOB. Pt states that she has been dealing with this for several weeks and cannot seem to get rid of it. States that as soon as it goes away it returns shortly after. Denies chest tightness and wheezing. No fever. Feels that this has turned into a sinus infection.  Using Alka-Seltzer Cold and Sinus ( 2nd box) and not getting much relief.  Would like something called in if possible.  Allergies  Allergen Reactions  . Azithromycin     REACTION: rash  . Codeine   . Food     shellfish  . Penicillins    Please advise Dr Annamaria Boots. Thanks.     Medication List       This list is accurate as of: 02/17/15  9:05 AM.  Always use your most recent med list.               atenolol 25 MG tablet  Commonly known as:  TENORMIN  Take 25 mg by mouth daily.     Azelastine-Fluticasone 137-50 MCG/ACT Susp  Commonly known as:  DYMISTA  Place 1-2 puffs into the nose at bedtime as needed.     cefdinir 300 MG capsule  Commonly known as:  OMNICEF  Take 1 capsule (300 mg total) by mouth 2 (two) times daily.     doxycycline 100 MG tablet  Commonly known as:  VIBRA-TABS  Take 1 tablet (100 mg total) by mouth 2 (two) times daily.     estradiol 0.1 MG/24HR patch  Commonly known as:  VIVELLE-DOT  Place 1 patch onto the skin. Wear patch x7 days, replace with a new one.     levothyroxine 25 MCG tablet  Commonly known as:  SYNTHROID, LEVOTHROID  Take 25 mcg by mouth daily before breakfast.     predniSONE 10 MG tablet  Commonly known as:  DELTASONE  40mg  X2 days, 30mg  X2 days, 20mg  X2 days, 10mg  X2 days, then stop.     protriptyline 5 MG tablet  Commonly known as:  VIVACTIL  Take 1 tablet (5 mg total) by mouth at bedtime.     pseudoephedrine 120 MG 12 hr tablet  Commonly known as:  SUDAFED  Take 120 mg by mouth 2 (two) times daily.

## 2015-02-17 NOTE — Telephone Encounter (Signed)
Patient Returned call  [phone]

## 2015-02-19 ENCOUNTER — Telehealth: Payer: Self-pay | Admitting: Internal Medicine

## 2015-02-19 MED ORDER — PREDNISONE 10 MG PO TABS
ORAL_TABLET | ORAL | Status: DC
Start: 2015-02-19 — End: 2015-03-27

## 2015-02-19 NOTE — Telephone Encounter (Signed)
Ok prednisone 10 mg, # 20, 4 X 2 DAYS, 3 X 2 DAYS, 2 X 2 DAYS, 1 X 2 DAYS  

## 2015-02-19 NOTE — Telephone Encounter (Signed)
Spoke with pt. She called in 2 days ago with symptoms of sinus pressure/pain and drainage. CY gave her Cefdinir. She is now requesting that we send in a prednisone taper to take along with this. CY - please advise. Thanks.  Allergies  Allergen Reactions  . Azithromycin     REACTION: rash  . Codeine   . Food     shellfish  . Penicillins    Current Outpatient Prescriptions on File Prior to Visit  Medication Sig Dispense Refill  . atenolol (TENORMIN) 25 MG tablet Take 25 mg by mouth daily.      . Azelastine-Fluticasone (DYMISTA) 137-50 MCG/ACT SUSP Place 1-2 puffs into the nose at bedtime as needed. 1 Bottle 11  . cefdinir (OMNICEF) 300 MG capsule Take 1 capsule (300 mg total) by mouth 2 (two) times daily. 20 capsule 0  . doxycycline (VIBRA-TABS) 100 MG tablet Take 1 tablet (100 mg total) by mouth 2 (two) times daily. 20 tablet 0  . estradiol (VIVELLE-DOT) 0.1 MG/24HR Place 1 patch onto the skin. Wear patch x7 days, replace with a new one.    . levothyroxine (SYNTHROID, LEVOTHROID) 25 MCG tablet Take 25 mcg by mouth daily before breakfast.    . predniSONE (DELTASONE) 10 MG tablet 40mg  X2 days, 30mg  X2 days, 20mg  X2 days, 10mg  X2 days, then stop. 20 tablet 0  . protriptyline (VIVACTIL) 5 MG tablet Take 1 tablet (5 mg total) by mouth at bedtime. 90 tablet 3  . pseudoephedrine (SUDAFED) 120 MG 12 hr tablet Take 120 mg by mouth 2 (two) times daily.    . [DISCONTINUED] hydrochlorothiazide 25 MG tablet Take 25 mg by mouth daily.      . [DISCONTINUED] imipramine (TOFRANIL) 25 MG tablet Take 1 tablet (25 mg total) by mouth at bedtime. 15 tablet 0  . [DISCONTINUED] progesterone (PROMETRIUM) 100 MG capsule Take 100 mg by mouth daily.       No current facility-administered medications on file prior to visit.

## 2015-02-19 NOTE — Telephone Encounter (Signed)
Spoke with pt. Aware of recs. RX sent in. Nothing further needed 

## 2015-03-24 ENCOUNTER — Telehealth: Payer: Self-pay | Admitting: Internal Medicine

## 2015-03-24 MED ORDER — PREDNISONE 10 MG PO TABS: 10.0000 mg | ORAL_TABLET | Freq: Every day | ORAL | Status: DC

## 2015-03-24 NOTE — Telephone Encounter (Signed)
At this time of year, it is more likely to be viral than allergy. If it gets worse we can add antibiotic.  For now can offer prednisone 10 mg # 7, 1 daily.  Saline nasal rinse, like with a Neti pot, Mucinex, antihistamines all may helps ome. Please let us know if it doesn't get better.

## 2015-03-24 NOTE — Telephone Encounter (Signed)
Spoke with pt, states she's had ear/mouth/eye itching, pnd, sinus congestion, throat clearing, chills, hoarseness, sneezing X6 days. Denies fever, chest congestion.  Pt has taken allegra and alka-seltzer cold and sinus, as well as saline nasal spray.  Pt uses [facility] on [address].    Last ov: 09/11/14 Next ov: 05/18/15  CY please advise.  Thanks.   Allergies  Allergen Reactions  . Azithromycin     REACTION: rash  . Codeine   . Food     shellfish  . Penicillins    Current Outpatient Prescriptions on File Prior to Visit  Medication Sig Dispense Refill  . atenolol (TENORMIN) 25 MG tablet Take 25 mg by mouth daily.      . Azelastine-Fluticasone (DYMISTA) 137-50 MCG/ACT SUSP Place 1-2 puffs into the nose at bedtime as needed. 1 Bottle 11  . cefdinir (OMNICEF) 300 MG capsule Take 1 capsule (300 mg total) by mouth 2 (two) times daily. 20 capsule 0  . doxycycline (VIBRA-TABS) 100 MG tablet Take 1 tablet (100 mg total) by mouth 2 (two) times daily. 20 tablet 0  . estradiol (VIVELLE-DOT) 0.1 MG/24HR Place 1 patch onto the skin. Wear patch x7 days, replace with a new one.    . levothyroxine (SYNTHROID, LEVOTHROID) 25 MCG tablet Take 25 mcg by mouth daily before breakfast.    . predniSONE (DELTASONE) 10 MG tablet Take 4 X 2 DAYS, 3 X 2 DAYS, 2 X 2 DAYS, 1 X 2 DAYS 20 tablet 0  . protriptyline (VIVACTIL) 5 MG tablet Take 1 tablet (5 mg total) by mouth at bedtime. 90 tablet 3  . pseudoephedrine (SUDAFED) 120 MG 12 hr tablet Take 120 mg by mouth 2 (two) times daily.    . [DISCONTINUED] hydrochlorothiazide 25 MG tablet Take 25 mg by mouth daily.      . [DISCONTINUED] imipramine (TOFRANIL) 25 MG tablet Take 1 tablet (25 mg total) by mouth at bedtime. 15 tablet 0  . [DISCONTINUED] progesterone (PROMETRIUM) 100 MG capsule Take 100 mg by mouth daily.       No current facility-administered medications on file prior to visit.

## 2015-03-24 NOTE — Telephone Encounter (Signed)
Spoke with pt, aware of recs.  Prednisone sent to pharmacy.  Nothing further needed.

## 2015-03-27 ENCOUNTER — Encounter: Payer: Self-pay | Admitting: Adult Health

## 2015-03-27 ENCOUNTER — Ambulatory Visit (INDEPENDENT_AMBULATORY_CARE_PROVIDER_SITE_OTHER)
Admission: RE | Admit: 2015-03-27 | Discharge: 2015-03-27 | Disposition: A | Discharge status: Home / Self Care | Payer: 59 | Source: Ambulatory Visit | Attending: Adult Health | Admitting: Adult Health

## 2015-03-27 ENCOUNTER — Telehealth: Payer: Self-pay | Admitting: Internal Medicine

## 2015-03-27 ENCOUNTER — Ambulatory Visit (INDEPENDENT_AMBULATORY_CARE_PROVIDER_SITE_OTHER): Payer: 59 | Admitting: Adult Health

## 2015-03-27 VITALS — BP 134/86 | HR 74 | Temp 98.1°F | Ht 65.0 in | Wt 147.0 lb

## 2015-03-27 DIAGNOSIS — J209 Acute bronchitis, unspecified: Secondary | ICD-10-CM | POA: Diagnosis not present

## 2015-03-27 DIAGNOSIS — J329 Chronic sinusitis, unspecified: Secondary | ICD-10-CM

## 2015-03-27 MED ORDER — HYDROCODONE-HOMATROPINE 5-1.5 MG/5ML PO SYRP: 5.0000 mL | ORAL_SOLUTION | Freq: Four times a day (QID) | ORAL | Status: DC | PRN

## 2015-03-27 MED ORDER — LEVALBUTEROL HCL 0.63 MG/3ML IN NEBU
0.6300 mg | INHALATION_SOLUTION | Freq: Once | RESPIRATORY_TRACT | Status: AC
  Administered 2015-03-27: 0.63 mg via RESPIRATORY_TRACT

## 2015-03-27 MED ORDER — LEVOFLOXACIN 500 MG PO TABS: 500.0000 mg | ORAL_TABLET | Freq: Every day | ORAL | Status: DC

## 2015-03-27 NOTE — Assessment & Plan Note (Signed)
Probable flare   Plan  Levaquin 500mg  daily for 10 days .  Begin Probiotic daily for 1 month.  Mucinex DM Twice daily  As needed  Congestion .  Saline nasal rinses As needed   Hydromet 1 tsp every 6hr As needed  Cough, may make you sleepy.  Follow up Dr. Annamaria Boots  In 4 weeks and As needed   Please contact office for sooner follow up if symptoms do not improve or worsen or seek emergency care

## 2015-03-27 NOTE — Assessment & Plan Note (Signed)
>>  ASSESSMENT AND PLAN FOR SINUSITIS, CHRONIC WRITTEN ON 03/27/2015 11:07 AM BY PARRETT, TAMMY S, NP  Probable flare   Plan  Levaquin 500mg  daily for 10 days .  Begin Probiotic daily for 1 month.  Mucinex DM Twice daily  As needed  Congestion .  Saline nasal rinses As needed   Hydromet 1 tsp every 6hr As needed  Cough, may make you sleepy.  Follow up Dr. Maple HudsonYoung  In 4 weeks and As needed   Please contact office for sooner follow up if symptoms do not improve or worsen or seek emergency care

## 2015-03-27 NOTE — Addendum Note (Signed)
Addended by: Osa Craver on: 03/27/2015 11:10 AM   Modules accepted: Orders

## 2015-03-27 NOTE — Assessment & Plan Note (Signed)
Slow to resolve flare +/- sinusitis  cxr with no acute process.  xopenex neb x 1   Plan  Levaquin 500mg  daily for 10 days .  Begin Probiotic daily for 1 month.  Mucinex DM Twice daily  As needed  Congestion .  Saline nasal rinses As needed   Hydromet 1 tsp every 6hr As needed  Cough, may make you sleepy.  Follow up Dr. Annamaria Boots  In 4 weeks and As needed   Please contact office for sooner follow up if symptoms do not improve or worsen or seek emergency care

## 2015-03-27 NOTE — Progress Notes (Signed)
Patient ID: Dominique Brock, female    DOB: [birth year], 58 y.o.   MRN: [id number]  HPI 07/02/10- 4 yo F never smoker followed for asthma, allergic rhinitis.  Last here March 24, 2010. Note reviewed. Got through FEB/ March and April ok. Just in the last 2 weeks, says her allergies have bothered her with pressure behind her eyes, right face fullness, no green and no sore throat or fever. Minor cough, no wheeze. She had tried coricidin, avoiding decongestants/ BP stimulants. She didn't find steroid nasal sprays helpful., and liked zyrtec better than Xyzal.   05/04/11- 67 yo F never smoker followed for asthma, allergic rhinitis. ACUTE VISIT- chills, head congestion x1week, cough, sore throat, nausea, ear pressure and slight sob. Tx w/ OTC Zyrtec-D- with no improvement. Ears itch. Feels stomach upset without nausea or vomiting. Chills for 2 days without definite fever. Nothing purulent.  05/15/12-  68 yo F never smoker followed for asthma, allergic rhinitis, OSA. FOLLOWS FOR: still having flare ups of allergies-continues to use Sudafed OTC; Also would like to discuss getting another Rx for Vivactil (90 day supply) [facility] told pt that vivactil is no longer made. She is getting her allergy care elsewhere now. Diagnosed with diabetes, controlled with weight management. Seeing ENT for sinus balloon treatment because of chronic nasal pressure. She has not been told now that she snores and no longer has bad daytime tiredness.  05/15/13-58 yo F never smoker followed for asthma, allergic rhinitis, OSA. Pt does not have any sleep assistance gear but is interested in a dental device.  Pt has no breathing or sleeping complaints at this time.  Still feels she needs vivactil to feel alert. She sleeps well and is no longer aware that snoring is an issue. Husband does not complain. NPSG 08/15/00 RDI/ AHI 15/ hr, weight 126 lbs.  05/16/14- 74 yo F never smoker followed for asthma, allergic rhinitis, OSA FOLLOWS FOR:  Pt denies any current issues with sleep. Pt does not use any type of sleep aid.  Still using Vivactil successfully at night and wishes to continue this. She feels better off with it and does not find it bothering her sleep. Blames pollen allergy season for uncomfortable pressure in her ears. No sneezing and no chest discomfort. Using Zyrtec +12 hour Sudafed 120 mg each morning  09/11/14- 66 yo F never smoker followed for asthma, allergic rhinitis, OSA ACUTE - nasal congestion/drainage, left ear pain, Nonproductive cough Got chilled at a theater. We called in doxycycline and prednisone. Dry cough now worse. Clear postnasal drip. Antibiotic bothering stomach. Left ear pressure. Sore throat  03/27/2015 60 yo F never smoker followed for asthma /AR and OSA  Pt presents for an acute office visit.  Complains of  prod cough with yellow mucus, sinus pressure/drainage, chest congestion/tightness at times, SOB and nausea that started at Christmas. Was called in [place] and pred pack on 1/3 . Says she  did feel better but symptoms never went totally away. Cough and congestion with sinus pressure picked back up on 1/23. She is taking otc cold meds without much relief. Denies fever, n/v/d., chest pain, orthopnea, edema or hemoptysis .  She denies calf pain, leg swelling, recent travel or pleuritic pain.  Caring for grand kids that have been sick.  Son in law was recently with critical illness.  CXR today with BB atx, no acute infiltrate.    ROS-see HPI Constitutional:   No-   weight loss, night sweats, fevers, ., fatigue,  lassitude. HEENT:   No-  headaches, difficulty swallowing, tooth/dental problems, sore throat,       No-  sneezing, itching, +ear ache, +nasal congestion, post nasal drip,  CV:  No-   chest pain, orthopnea, PND, swelling in lower extremities, anasarca, dizziness, palpitations Resp: No-   shortness of breath with exertion or at rest.              + productive cough,  No non-productive cough,   No- coughing up of blood.                No- wheezing.   Skin: No-   rash or lesions. GI:  No-   heartburn, indigestion, abdominal pain, nausea, vomiting,  GU:  MS:  No-   joint pain or swelling. . Neuro-     nothing unusual Psych:  No- change in mood or affect. No depression or anxiety.  No memory loss.  OBJ- Physical Exam General- Alert, Oriented, Affect-appropriate, Distress- none acute Skin- rash-none, lesions- none, excoriation- none Lymphadenopathy- none Head- atraumatic            Eyes- Gross vision intact, PERRLA, conjunctivae and secretions clear. +Chronic  periorbital edema/ fat.            Ears- Hearing, canals-normal,            Nose- clear, no-Septal dev, mucus, polyps, erosion, perforation , +max tenderness.             Throat- Mallampati III , mucosa clear , drainage- none, tonsils- atrophic, + hoarse Neck- flexible , trachea midline, no stridor , thyroid nl, carotid no bruit Chest - symmetrical excursion , unlabored           Heart/CV- RRR , no murmur , no gallop  , no rub, nl s1 s2                           - JVD- none , edema- none, stasis changes- none, varices- none           Lung- clear to P&A, wheeze- none, cough + dry , dullness-none, rub- none           Chest wall-  Abd-  Br/ Gen/ Rectal- Not done, not indicated Extrem- cyanosis- none, clubbing, none, atrophy- none, strength- nl Neuro- grossly intact to observation

## 2015-03-27 NOTE — Patient Instructions (Addendum)
Levaquin 500mg  daily for 10 days .  Begin Probiotic daily for 1 month.  Mucinex DM Twice daily  As needed  Congestion .  Saline nasal rinses As needed   Hydromet 1 tsp every 6hr As needed  Cough, may make you sleepy.  Follow up Dr. Annamaria Boots  In 4 weeks and As needed   Please contact office for sooner follow up if symptoms do not improve or worsen or seek emergency care

## 2015-03-27 NOTE — Telephone Encounter (Signed)
Called spoke with pt. She is scheduled to see TP this AM at 10:15. Nothing further needed

## 2015-05-18 ENCOUNTER — Ambulatory Visit: Payer: 59 | Admitting: Internal Medicine

## 2015-06-17 ENCOUNTER — Other Ambulatory Visit: Payer: Self-pay | Admitting: Internal Medicine

## 2015-06-22 NOTE — Telephone Encounter (Signed)
CY-please advise if okay to refill. Thanks.  

## 2015-06-23 ENCOUNTER — Telehealth: Payer: Self-pay | Admitting: Internal Medicine

## 2015-06-23 MED ORDER — PROTRIPTYLINE HCL 5 MG PO TABS: 5.0000 mg | ORAL_TABLET | Freq: Every day | ORAL | Status: DC

## 2015-06-23 NOTE — Telephone Encounter (Signed)
Ok to refill 

## 2015-06-23 NOTE — Telephone Encounter (Signed)
Spoke with pt. Made her aware that we will send in her rx. Nothing further was needed.

## 2015-06-23 NOTE — Telephone Encounter (Signed)
Called spoke with patient who verified that she would like a refill on her Vivactil 5mg  1po QHS #90 to [facility]. Last ov w/ CDY was 7.28.16 but saw TP acutely 2.10.17 Her last refill was 4.1.16 #90 w/ 3 refills  Dr Annamaria Boots please advise if okay to refill, thank you.

## 2015-07-17 ENCOUNTER — Encounter: Payer: Self-pay | Admitting: Internal Medicine

## 2015-07-17 ENCOUNTER — Ambulatory Visit (INDEPENDENT_AMBULATORY_CARE_PROVIDER_SITE_OTHER): Payer: 59 | Admitting: Internal Medicine

## 2015-07-17 VITALS — BP 124/74 | HR 63 | Ht 65.0 in | Wt 139.8 lb

## 2015-07-17 DIAGNOSIS — G4733 Obstructive sleep apnea (adult) (pediatric): Secondary | ICD-10-CM

## 2015-07-17 DIAGNOSIS — J324 Chronic pansinusitis: Secondary | ICD-10-CM | POA: Diagnosis not present

## 2015-07-17 DIAGNOSIS — I10 Essential (primary) hypertension: Secondary | ICD-10-CM

## 2015-07-17 MED ORDER — VERAPAMIL HCL 80 MG PO TABS: 80.0000 mg | ORAL_TABLET | Freq: Three times a day (TID) | ORAL | Status: DC

## 2015-07-17 NOTE — Progress Notes (Signed)
Patient ID: Dominique Brock, female    DOB: 06-09-1955, 60 y.o.   MRN: [id number]  HPI 07/02/10- 35 yo F never smoker followed for asthma, allergic rhinitis.  Last here March 24, 2010. Note reviewed. Got through FEB/ March and April ok. Just in the last 2 weeks, says her allergies have bothered her with pressure behind her eyes, right face fullness, no green and no sore throat or fever. Minor cough, no wheeze. She had tried coricidin, avoiding decongestants/ BP stimulants. She didn't find steroid nasal sprays helpful., and liked zyrtec better than Xyzal.   05/04/11- 55 yo F never smoker followed for asthma, allergic rhinitis. ACUTE VISIT- chills, head congestion x1week, cough, sore throat, nausea, ear pressure and slight sob. Tx w/ OTC Zyrtec-D- with no improvement. Ears itch. Feels stomach upset without nausea or vomiting. Chills for 2 days without definite fever. Nothing purulent.  05/15/12-  40 yo F never smoker followed for asthma, allergic rhinitis, OSA. FOLLOWS FOR: still having flare ups of allergies-continues to use Sudafed OTC; Also would like to discuss getting another Rx for Vivactil (90 day supply) [facility] told pt that vivactil is no longer made. She is getting her allergy care elsewhere now. Diagnosed with diabetes, controlled with weight management. Seeing ENT for sinus balloon treatment because of chronic nasal pressure. She has not been told now that she snores and no longer has bad daytime tiredness.  05/15/13-58 yo F never smoker followed for asthma, allergic rhinitis, OSA. Pt does not have any sleep assistance gear but is interested in a dental device.  Pt has no breathing or sleeping complaints at this time.  Still feels she needs vivactil to feel alert. She sleeps well and is no longer aware that snoring is an issue. Husband does not complain. NPSG 08/15/00 RDI/ AHI 15/ hr, weight 126 lbs.  05/16/14- 43 yo F never smoker followed for asthma, allergic rhinitis, OSA FOLLOWS FOR:  Pt denies any current issues with sleep. Pt does not use any type of sleep aid.  Still using Vivactil successfully at night and wishes to continue this. She feels better off with it and does not find it bothering her sleep. Blames pollen allergy season for uncomfortable pressure in her ears. No sneezing and no chest discomfort. Using Zyrtec +12 hour Sudafed 120 mg each morning  09/11/14- 77 yo F never smoker followed for asthma, allergic rhinitis, OSA ACUTE - nasal congestion/drainage, left ear pain, Nonproductive cough Got chilled at a theater. We called in doxycycline and prednisone. Dry cough now worse. Clear postnasal drip. Antibiotic bothering stomach. Left ear pressure. Sore throat  07/17/15-60 year old female never smoker followed for asthma, allergic rhinitis, OSA, complicated by DM 2, HBP LOV- NP 03/27/15 for acute bronchitis treated with Levaquin and Hydromet Had done well after last here. Yesterday had beginning left maxillary and retro-orbital pressure sensation and stopped in left ear. Steam helped. Feels much better today. Mostly using Sudafed and Zyrtec when needed. Did not like Dymista. Not much drainage. Occasional epistaxis right nostril. OSA has been well-controlled with just Vivactil and weight control. Not getting complaints of snoring.  ROS-see HPI Constitutional:   No-   weight loss, night sweats, fevers, ., fatigue, lassitude. HEENT:   No-  headaches, difficulty swallowing, tooth/dental problems, sore throat,       No-  sneezing, itching, +ear ache, +nasal congestion, post nasal drip,  CV:  No-   chest pain, orthopnea, PND, swelling in lower extremities, anasarca, dizziness, palpitations Resp: No-  shortness of breath with exertion or at rest.              No-   productive cough,  No non-productive cough,  No- coughing up of blood.              No-   change in color of mucus.  No- wheezing.   Skin: No-   rash or lesions. GI:  No-   heartburn, indigestion, abdominal pain,  nausea, vomiting,  GU:  MS:  No-   joint pain or swelling. . Neuro-     nothing unusual Psych:  No- change in mood or affect. No depression or anxiety.  No memory loss.  OBJ- Physical Exam General- Alert, Oriented, Affect-appropriate, Distress- none acute Skin- rash-none, lesions- none, excoriation- none Lymphadenopathy- none Head- atraumatic            Eyes- Gross vision intact, PERRLA, conjunctivae and secretions clear. +Chronic  periorbital edema/ fat.            Ears- Hearing, canals-normal,             Nose- clear, no-Septal dev, mucus, polyps, erosion, perforation             Throat- Mallampati III , mucosa clear , drainage- none, tonsils- atrophic,  Neck- flexible , trachea midline, no stridor , thyroid nl, carotid no bruit Chest - symmetrical excursion , unlabored           Heart/CV- RRR , no murmur , no gallop  , no rub, nl s1 s2                           - JVD- none , edema- none, stasis changes- none, varices- none           Lung- clear to P&A, wheeze- none, cough-none , dullness-none, rub- none           Chest wall-  Abd-  Br/ Gen/ Rectal- Not done, not indicated Extrem- cyanosis- none, clubbing, none, atrophy- none, strength- nl Neuro- grossly intact to observation

## 2015-07-17 NOTE — Assessment & Plan Note (Signed)
While we try verapamil as an anti-inflammatory for her chronic rhinosinusitis, we will drop atenolol until we see how she tolerates this change.

## 2015-07-17 NOTE — Assessment & Plan Note (Signed)
She has dealt with chronic nasal mucosal inflammation and congestion for years. We talked today about reports that verapamil blocked "substance P"-a nasal mucosal inflammatory mediator to reduce this kind of chronic rhinitis and some people. I shared the report with her and we reviewed contraindications and adverse effects in [facility]. She wanted to try it so we are substituting it for her atenolol for now.

## 2015-07-17 NOTE — Assessment & Plan Note (Signed)
She has kept her weight down and continues to feel better taking Vivactil which is an old therapy, originally an antidepressant, which early literature head suggested would reduce his snoring. She feels better taking it.

## 2015-07-17 NOTE — Patient Instructions (Signed)
We are trying verapamil instead of atenolol, to see if it helps your nasal inflammation as an "off-label" use, as discussed. Script sent.  Please call as needed

## 2015-07-17 NOTE — Assessment & Plan Note (Signed)
>>  ASSESSMENT AND PLAN FOR SINUSITIS, CHRONIC WRITTEN ON 07/17/2015  3:50 PM BY YOUNG, CLINTON D, MD  She has dealt with chronic nasal mucosal inflammation and congestion for years. We talked today about reports that verapamil blocked "substance P"-a nasal mucosal inflammatory mediator to reduce this kind of chronic rhinitis and some people. I shared the report with her and we reviewed contraindications and adverse effects in [facility]. She wanted to try it so we are substituting it for her atenolol for now.

## 2015-09-22 ENCOUNTER — Telehealth: Payer: Self-pay | Admitting: Internal Medicine

## 2015-09-22 MED ORDER — PREDNISONE 10 MG PO TABS: ORAL_TABLET | ORAL | 0 refills | Status: DC

## 2015-09-22 NOTE — Telephone Encounter (Signed)
Spoke with pt, c/o stuffy ears, headache, sinus congestion, PND, eye and teeth soreness, sore throat, nonprod cough, chills X1 week.  Denies fever, n/v.    Pt has been taking alka-seltzer sinus relief and sudafed to help with s/s.    Pt uses [facility] on [address].    Last ov: 07/17/15 Next ov: 02/22/16  CY please advise on recs.  Thanks!   Allergies  Allergen Reactions  . Azithromycin     REACTION: rash  . Codeine   . Food     shellfish  . Penicillins    Current Outpatient Prescriptions on File Prior to Visit  Medication Sig Dispense Refill  . estradiol (VIVELLE-DOT) 0.1 MG/24HR Place 1 patch onto the skin. Wear patch x7 days, replace with a new one.    . levothyroxine (SYNTHROID, LEVOTHROID) 25 MCG tablet Take 25 mcg by mouth daily before breakfast.    . protriptyline (VIVACTIL) 5 MG tablet Take 1 tablet (5 mg total) by mouth at bedtime. 90 tablet 3  . pseudoephedrine (SUDAFED) 120 MG 12 hr tablet Take 120 mg by mouth 2 (two) times daily.    . verapamil (CALAN) 80 MG tablet Take 1 tablet (80 mg total) by mouth 3 (three) times daily. 90 tablet 5  . [DISCONTINUED] hydrochlorothiazide 25 MG tablet Take 25 mg by mouth daily.      . [DISCONTINUED] imipramine (TOFRANIL) 25 MG tablet Take 1 tablet (25 mg total) by mouth at bedtime. 15 tablet 0  . [DISCONTINUED] progesterone (PROMETRIUM) 100 MG capsule Take 100 mg by mouth daily.       No current facility-administered medications on file prior to visit.

## 2015-09-22 NOTE — Addendum Note (Signed)
Addended by: Virl Cagey on: 09/22/2015 04:03 PM   Modules accepted: Orders

## 2015-09-22 NOTE — Telephone Encounter (Signed)
Offer prednisone 10 mg, # 20, 4 X 2 DAYS, 3 X 2 DAYS, 2 X 2 DAYS, 1 X 2 DAYS  

## 2015-09-22 NOTE — Telephone Encounter (Signed)
Spoke with pt, aware of rec's per CY Pred taper sent to pharmacy. Nothing further needed.

## 2015-10-02 ENCOUNTER — Telehealth: Payer: Self-pay | Admitting: Internal Medicine

## 2015-10-02 NOTE — Telephone Encounter (Signed)
Called and spoke with pt  And she is aware of CY recs.  She requested that CY [facility].  Pt takes the atenolol 25mg   1 daily.  CY please advise if ok to send in refill. Thanks

## 2015-10-02 NOTE — Telephone Encounter (Signed)
Called and spoke with pt and she is aware of CY recs.  Nothing further is needed.  

## 2015-10-02 NOTE — Telephone Encounter (Signed)
Ok, she has given Verapamil a good try. Ok to stop verapamil, go back to her previous BP med (atenolol). Watch to see if there is any difference in sinus issues.

## 2015-10-02 NOTE — Telephone Encounter (Signed)
We weren't the original providers of her atenolol. It would be better that she get it from her PCP or whoever will be following BP long term.

## 2015-10-02 NOTE — Telephone Encounter (Signed)
Pt states that the new BP med is making her feel tired and weak. Pt states that she has no energy and is sleeping through her alarms. Pt states that she feels that she is not getting a good nights rest. Pt notes some lightheadedness/dizziness at times. Pt states that this is second month of taking the Verapamil. Pt states that she has been having some breathlessness with exertion.   Instructions  Return in about 3 months (around 10/17/2015).  We are trying verapamil instead of atenolol, to see if it helps your nasal inflammation as an "off-label" use, as discussed. Script sent.  Please call as needed   Please advise Dr Annamaria Boots. Thanks.    Medication List       Accurate as of 10/02/15  8:58 AM. Always use your most recent med list.          estradiol 0.1 MG/24HR patch Commonly known as:  VIVELLE-DOT Place 1 patch onto the skin. Wear patch x7 days, replace with a new one.   levothyroxine 25 MCG tablet Commonly known as:  SYNTHROID, LEVOTHROID Take 25 mcg by mouth daily before breakfast.   predniSONE 10 MG tablet Commonly known as:  DELTASONE Take 4 tabs po x 2 days, then 3 x 2 days, then 2 x 2 days, then 1 x 2 days then stop.   protriptyline 5 MG tablet Commonly known as:  VIVACTIL Take 1 tablet (5 mg total) by mouth at bedtime.   pseudoephedrine 120 MG 12 hr tablet Commonly known as:  SUDAFED Take 120 mg by mouth 2 (two) times daily.   verapamil 80 MG tablet Commonly known as:  CALAN Take 1 tablet (80 mg total) by mouth 3 (three) times daily.      Allergies  Allergen Reactions  . Azithromycin     REACTION: rash  . Codeine   . Food     shellfish  . Penicillins

## 2015-10-21 ENCOUNTER — Ambulatory Visit: Payer: 59 | Admitting: Internal Medicine

## 2015-12-22 ENCOUNTER — Telehealth: Payer: Self-pay | Admitting: Internal Medicine

## 2015-12-22 MED ORDER — CEFDINIR 300 MG PO CAPS: 300.0000 mg | ORAL_CAPSULE | Freq: Two times a day (BID) | ORAL | 0 refills | Status: DC

## 2015-12-22 NOTE — Telephone Encounter (Signed)
Sounds as if we had better cover for sinus infection  Offer Cefdinir 300 mg, # 20, 1 twice daily. Should be able to tolerate this despite penicillin intolerance

## 2015-12-22 NOTE — Telephone Encounter (Signed)
Spoke with pt who states she is having some allergy issues. Pt c/o bilateral ear pain, non prod cough, face soreness, sore throat,chills & sweats X1wk.  Pt unsure if she has had a tempeture. Pt currently taking sudafed & allgera with no improvement.   CY please advise. Thanks.

## 2015-12-22 NOTE — Telephone Encounter (Signed)
Pt aware of CY recommendations. Rx has been sent to preferred pharmacy. Pt aware & voiced understanding. Nothing further needed.  

## 2016-01-21 ENCOUNTER — Telehealth: Payer: Self-pay | Admitting: Internal Medicine

## 2016-01-21 MED ORDER — DOXYCYCLINE HYCLATE 100 MG PO TABS: ORAL_TABLET | ORAL | 0 refills | Status: DC

## 2016-01-21 NOTE — Telephone Encounter (Signed)
Pt returning call.Dominique Brock ° °

## 2016-01-21 NOTE — Telephone Encounter (Signed)
Offer doxycycline 100 mg, # 8, 2 today then one daily  Otc cold and flu remedies are fine to try if they help

## 2016-01-21 NOTE — Telephone Encounter (Signed)
LMTCB

## 2016-01-21 NOTE — Telephone Encounter (Signed)
Spoke with pt. States that she is getting an URI. Reports sinus congestion, ear pain, eye pain, facial pain and sneezing. Has been taking Allegra and Sudafed. Denies coughing, chest tightness, wheezing, SOB or fever. Would like CY's recommendations. CY - please advise. Thanks.  Allergies  Allergen Reactions  . Azithromycin     REACTION: rash  . Codeine   . Food     shellfish  . Penicillins    Current Outpatient Prescriptions on File Prior to Visit  Medication Sig Dispense Refill  . cefdinir (OMNICEF) 300 MG capsule Take 1 capsule (300 mg total) by mouth 2 (two) times daily. 20 capsule 0  . estradiol (VIVELLE-DOT) 0.1 MG/24HR Place 1 patch onto the skin. Wear patch x7 days, replace with a new one.    . levothyroxine (SYNTHROID, LEVOTHROID) 25 MCG tablet Take 25 mcg by mouth daily before breakfast.    . predniSONE (DELTASONE) 10 MG tablet Take 4 tabs po x 2 days, then 3 x 2 days, then 2 x 2 days, then 1 x 2 days then stop. 20 tablet 0  . protriptyline (VIVACTIL) 5 MG tablet Take 1 tablet (5 mg total) by mouth at bedtime. 90 tablet 3  . pseudoephedrine (SUDAFED) 120 MG 12 hr tablet Take 120 mg by mouth 2 (two) times daily.    . verapamil (CALAN) 80 MG tablet Take 1 tablet (80 mg total) by mouth 3 (three) times daily. 90 tablet 5  . [DISCONTINUED] hydrochlorothiazide 25 MG tablet Take 25 mg by mouth daily.      . [DISCONTINUED] imipramine (TOFRANIL) 25 MG tablet Take 1 tablet (25 mg total) by mouth at bedtime. 15 tablet 0  . [DISCONTINUED] progesterone (PROMETRIUM) 100 MG capsule Take 100 mg by mouth daily.       No current facility-administered medications on file prior to visit.

## 2016-01-21 NOTE — Telephone Encounter (Signed)
Called and spoke to pt. Informed her of the recs per CY. Rx sent to preferred pharmacy. Pt verbalized understanding and denied any further questions or concerns at this time.   

## 2016-02-05 ENCOUNTER — Ambulatory Visit (INDEPENDENT_AMBULATORY_CARE_PROVIDER_SITE_OTHER): Payer: 59 | Admitting: Internal Medicine

## 2016-02-05 ENCOUNTER — Encounter: Payer: Self-pay | Admitting: Internal Medicine

## 2016-02-05 VITALS — BP 132/80 | HR 72 | Ht 65.0 in | Wt 144.6 lb

## 2016-02-05 DIAGNOSIS — R7303 Prediabetes: Secondary | ICD-10-CM | POA: Diagnosis not present

## 2016-02-05 DIAGNOSIS — E039 Hypothyroidism, unspecified: Secondary | ICD-10-CM

## 2016-02-05 NOTE — Patient Instructions (Signed)
Please stop the Levothyroxine for now.  Come back in 5-6 weeks for repeat labs.  Please come back for a follow-up appointment in 4 months.

## 2016-02-05 NOTE — Progress Notes (Signed)
Pre visit review using our clinic review tool, if applicable. No additional management support is needed unless otherwise documented below in the visit note. 

## 2016-02-05 NOTE — Progress Notes (Addendum)
Patient ID: Dominique Brock, female   DOB: 10-20-1955, 60 y.o.   MRN: [id number]    HPI  Dominique Brock is a 60 y.o.-year-old female, self-referred, for management of hypothyroidism and prediabetes. She was previously seen by Dr. Chalmers Cater.  Pt. has been dx with hypothyroidism in 02/2013 (TSH 3.85) >> on Levothyroxine 25 mcg.  She takes the thyroid hormone: - fasting - with water - separated by >30 min from b'fast  - no calcium, iron, PPIs, multivitamins   I reviewed pt's thyroid tests: 03/13/2015: TSH 1.23  03/06/2014: TSH 1.98 07/19/2013: TSH 1.95 05/02/2013: TSH 1.86 03/05/2013: TSH 3.85  Pt describes: - weight gain - fatigue - cold intolerance - depression - constipation - dry skin - hair loss  Pt denies feeling nodules in neck, hoarseness, dysphagia/odynophagia, SOB with lying down.  She has no FH of thyroid disorders.. No FH of thyroid cancer.  No h/o radiation tx to head or neck. No recent use of iodine supplements. Allergic to shrimp.  Pt. also has Prediabetes.  Last hemoglobin A1c was: 11/2015: HbA1c 5.3% 03/13/2015: HbA1c 6.1% (after Prednisone) 03/06/2014: HbA1c 5.9% 03/05/2013: HbA1c 5.4%  Pt's diabetes is diet-controlled.  Pt does not check her sugars at home.  Pt's meals are: - Breakfast: OJ, protein bar or egg and bacon - snack: coffee, yoghurt - Lunch: fast food (chicken nuggets, no fries; occasional soft drinks) - Dinner: can of soup or meat + veggies, some bread - Snacks: no  - no CKD, last BUN/creatinine:  03/13/2015: 22/0.77, eGFR 101 Lab Results  Component Value Date   BUN 12 06/29/2007   CREATININE 0.8 06/29/2007   - last set of lipids: 03/13/2015: 180/84/40/123 No results found for: CHOL, HDL, LDLCALC, LDLDIRECT, TRIG, CHOLHDL - last eye exam was in 2017. No DR.  - no numbness and tingling in her feet.  Pt has FH of DM in mother, MGM, other family members.  ROS: Constitutional: + weight gain/loss, no fatigue, + subjective  hyperthermia and hypothermia Eyes: + blurry vision, no xerophthalmia ENT: no sore throat, no nodules palpated in throat, + dysphagia/no odynophagia, no hoarseness, + tinnitus Cardiovascular: no CP/SOB/palpitations/leg swelling Respiratory: + Both: cough/SOB Gastrointestinal: no N/V/D/+ C Musculoskeletal: no muscle/joint aches Skin: no rashes, + easy bruising, + itching Neurological: no tremors/numbness/tingling/dizziness, + headache Psychiatric: no depression/anxiety  Past Medical History:  Diagnosis Date  . Asthma   . Diabetes mellitus   . Hypertension   . Sleep apnea    Past Surgical History:  Procedure Laterality Date  . ABDOMINAL HYSTERECTOMY    . BILATERAL OOPHORECTOMY    . SINUS EXPLORATION  1996   Social History   Social History  . Marital status: Married    Spouse name: N/A  . Number of children: 2   Occupational History  . Buyer   Social History Main Topics  . Smoking status: Never Smoker  . Smokeless tobacco: Never Used  . Alcohol use Yes     Comment: wine, once a week  . Drug use: No   Current Outpatient Prescriptions on File Prior to Visit  Medication Sig Dispense Refill  . cefdinir (OMNICEF) 300 MG capsule Take 1 capsule (300 mg total) by mouth 2 (two) times daily. 20 capsule 0  . doxycycline (VIBRA-TABS) 100 MG tablet Take 2 tablets today then 1 tablet daily till gone. 8 tablet 0  . estradiol (VIVELLE-DOT) 0.1 MG/24HR Place 1 patch onto the skin. Wear patch x7 days, replace with a new one.    . levothyroxine (  SYNTHROID, LEVOTHROID) 25 MCG tablet Take 25 mcg by mouth daily before breakfast.    . predniSONE (DELTASONE) 10 MG tablet Take 4 tabs po x 2 days, then 3 x 2 days, then 2 x 2 days, then 1 x 2 days then stop. 20 tablet 0  . protriptyline (VIVACTIL) 5 MG tablet Take 1 tablet (5 mg total) by mouth at bedtime. 90 tablet 3  . pseudoephedrine (SUDAFED) 120 MG 12 hr tablet Take 120 mg by mouth 2 (two) times daily.    . verapamil (CALAN) 80 MG tablet Take  1 tablet (80 mg total) by mouth 3 (three) times daily. 90 tablet 5  . [DISCONTINUED] hydrochlorothiazide 25 MG tablet Take 25 mg by mouth daily.      . [DISCONTINUED] imipramine (TOFRANIL) 25 MG tablet Take 1 tablet (25 mg total) by mouth at bedtime. 15 tablet 0  . [DISCONTINUED] progesterone (PROMETRIUM) 100 MG capsule Take 100 mg by mouth daily.       No current facility-administered medications on file prior to visit.    Allergies  Allergen Reactions  . Azithromycin     REACTION: rash  . Codeine   . Food     shellfish  . Penicillins    Family History  Problem Relation Age of Onset  . Hypertension Mother   . Hyperlipidemia Mother   . Liver cancer Mother   . Allergies Mother   . Diabetes Mother   . Healthy Father   . Healthy      2 siblings   PE: BP 132/80 (BP Location: Left Arm, Patient Position: Sitting, Cuff Size: Normal)   Pulse 72   Ht '5\' 5"'  (1.651 m)   Wt 144 lb 9.6 oz (65.6 kg)   SpO2 98%   BMI 24.06 kg/m  Wt Readings from Last 3 Encounters:  02/05/16 144 lb 9.6 oz (65.6 kg)  07/17/15 139 lb 12.8 oz (63.4 kg)  03/27/15 147 lb (66.7 kg)   Constitutional: Normal weight, in NAD Eyes: PERRLA, EOMI, no exophthalmos ENT: moist mucous membranes, no thyromegaly, no cervical lymphadenopathy Cardiovascular: RRR, No MRG Respiratory: CTA B Gastrointestinal: abdomen soft, NT, ND, BS+ Musculoskeletal: no deformities, strength intact in all 4 Skin: moist, warm, no rashes Neurological: no tremor with outstretched hands, DTR normal in all 4  ASSESSMENT: 1. Hypothyroidism  2. Prediabetes  PLAN:  1. Patient with questionable history of hypothyroidism (highest TSH available for review was 3.8. At that time, she was started on levothyroxine, low dose, 25 g daily. She is not sure whether she needs the medicine and I agree with her.  - She appears euthyroid, but complains of weight gain. Per our records, her weight today is lower than in 03/2015. - she does not appear to  have a goiter, thyroid nodules, or neck compression symptoms - We decided to stop her levothyroxine and check her TFTs again in 1.5 months. I will add antithyroid antibodies for then, to check for Hashimoto's thyroiditis. We discussed briefly about the condition and patient was giving reading materials about it. - We discussed about correct intake of levothyroxine in case we need to continue, fasting, with water, separated by at least 30 minutes from breakfast, and separated by more than 4 hours from calcium, iron, multivitamins, acid reflux medications (PPIs). She is taking it correctly. - I will see the patient back in 4 months  2. Patient with history of diabetes, diet-controlled. I reviewed available records, and her highest HbA1c was 6.1% after a course of steroids  at the beginning of 2017. Before and after this date, patients HbA1c has stayed lower than 6%. In this case, I believe that a better diagnosis would be prediabetes. At last check, patients HbA1c was 5.3%, which is in the normal range. - We discussed that prediabetes is a reversible condition and if she continues to pay attention to the diet, exercise, and keep her weight down, she may never developed diabetes - I would not recommend now to start checking her blood sugars, but we may need to do this if HbA1c starts to increase. - She is up-to-date with eye exams  - Return to clinic in 3 mo with sugar log   Component     Latest Ref Rng & Units 03/11/2016  Triiodothyronine,Free,Serum     2.3 - 4.2 pg/mL 2.5  T4,Free(Direct)     0.60 - 1.60 ng/dL 0.47 (L)  TSH     0.35 - 4.50 uIU/mL 3.40  Thyroperoxidase Ab SerPl-aCnc     <9 IU/mL <1  Thyroglobulin Ab     <2 IU/mL <1   No evidence of Hashimoto's thyroiditis. Her free T4 is slightly low, while the TSH and a free T3 are normal. I will advise her for now that she can continue without thyroid hormones, but I will recheck her tests when she comes back in 4 months.  [name],  MD PhD [facility] Endocrinology

## 2016-02-22 ENCOUNTER — Ambulatory Visit: Payer: 59 | Admitting: Internal Medicine

## 2016-03-11 ENCOUNTER — Other Ambulatory Visit (INDEPENDENT_AMBULATORY_CARE_PROVIDER_SITE_OTHER): Payer: 59

## 2016-03-11 DIAGNOSIS — E039 Hypothyroidism, unspecified: Secondary | ICD-10-CM | POA: Diagnosis not present

## 2016-03-11 LAB — TSH: TSH: 3.4 u[IU]/mL (ref 0.35–4.50)

## 2016-03-11 LAB — T4, FREE: FREE T4: 0.47 ng/dL — AB (ref 0.60–1.60)

## 2016-03-11 LAB — T3, FREE: T3, Free: 2.5 pg/mL (ref 2.3–4.2)

## 2016-03-14 LAB — THYROGLOBULIN ANTIBODY

## 2016-03-14 LAB — THYROID PEROXIDASE ANTIBODY: Thyroperoxidase Ab SerPl-aCnc: <1 [IU]/mL

## 2016-03-16 ENCOUNTER — Telehealth: Payer: Self-pay

## 2016-03-16 NOTE — Telephone Encounter (Signed)
-----   Message from [name], MD sent at 03/14/2016  5:59 PM EST ----- Dominique Brock, can you please call pt: No evidence of Hashimoto's thyroiditis (her thyroid antibodies were not high). Her Brock T4 is slightly low, while the TSH and a Brock T3 are normal. I would suggest to continue without thyroid hormones for now, and I will recheck her tests when she comes back in 4 months.

## 2016-03-16 NOTE — Telephone Encounter (Signed)
Called patient and advised of lab results. Patient had no questions at this time.

## 2016-04-21 ENCOUNTER — Ambulatory Visit: Payer: 59 | Admitting: Internal Medicine

## 2016-06-06 ENCOUNTER — Encounter: Payer: Self-pay | Admitting: Internal Medicine

## 2016-06-06 ENCOUNTER — Ambulatory Visit (INDEPENDENT_AMBULATORY_CARE_PROVIDER_SITE_OTHER): Payer: 59 | Admitting: Internal Medicine

## 2016-06-06 VITALS — BP 140/84 | HR 64 | Wt 153.0 lb

## 2016-06-06 DIAGNOSIS — E039 Hypothyroidism, unspecified: Secondary | ICD-10-CM

## 2016-06-06 DIAGNOSIS — R7303 Prediabetes: Secondary | ICD-10-CM | POA: Diagnosis not present

## 2016-06-06 LAB — POCT GLYCOSYLATED HEMOGLOBIN (HGB A1C): Hemoglobin A1C: 6.1

## 2016-06-06 MED ORDER — LEVOTHYROXINE SODIUM 25 MCG PO TABS: 25.0000 ug | ORAL_TABLET | Freq: Every day | ORAL | 5 refills | Status: DC

## 2016-06-06 MED ORDER — ATENOLOL 25 MG PO TABS: 25.0000 mg | ORAL_TABLET | Freq: Every day | ORAL | 3 refills | Status: DC

## 2016-06-06 NOTE — Patient Instructions (Signed)
Please come back in 1 month - 8 am, fasting.  Continue Levothyroxine 25 mcg daily.  Take the thyroid hormone every day, with water, at least 30 minutes before breakfast, separated by at least 4 hours from: - acid reflux medications - calcium - iron - multivitamins  Please come back for a follow-up appointment in 6 months.

## 2016-06-06 NOTE — Addendum Note (Signed)
Addended by: Caprice Beaver T on: 06/06/2016 03:50 PM   Modules accepted: Orders

## 2016-06-06 NOTE — Progress Notes (Signed)
Patient ID: TINE MABEE, female   DOB: [birth year], 61 y.o.   MRN: [id number]    HPI  RAFAELA DINIUS is a 61 y.o.-year-old female, returning for f/u for mild hypothyroidism and prediabetes. She was previously seen by Dr. Chalmers Cater. Last visit with me 4 mo ago.   Pt. has been dx with hypothyroidism in 02/2013 (TSH 3.85) >> on Levothyroxine 25 mcg (restarted 2 weeks ago). She feels much better on it.  She takes the thyroid hormone: - fasting - with water - separated by >30 min from b'fast  - no calcium, iron, PPIs, multivitamins   I reviewed pt's thyroid tests: Component     Latest Ref Rng & Units 03/11/2016  Triiodothyronine,Free,Serum     2.3 - 4.2 pg/mL 2.5  T4,Free(Direct)     0.60 - 1.60 ng/dL 0.47 (L)  TSH     0.35 - 4.50 uIU/mL 3.40  Thyroperoxidase Ab SerPl-aCnc     <9 IU/mL <1  Thyroglobulin Ab     <2 IU/mL <1   03/13/2015: TSH 1.23  03/06/2014: TSH 1.98 07/19/2013: TSH 1.95 05/02/2013: TSH 1.86 03/05/2013: TSH 3.85  Pt describes: - + weight gain - + fatigue - + cold and heat intolerance - no no depression - no constipation - no dry skin - no hair loss  Pt denies feeling nodules in neck, hoarseness, dysphagia/odynophagia, SOB with lying down.  She has no FH of thyroid disorders.. No FH of thyroid cancer.  No h/o radiation tx to head or neck. No recent use of iodine supplements. Allergic to shrimp.  Pt. also has Prediabetes.  Last hemoglobin A1c was: 11/2015: HbA1c 5.3% 03/13/2015: HbA1c 6.1% (after Prednisone) 03/06/2014: HbA1c 5.9% 03/05/2013: HbA1c 5.4%  Pt's prediabetes is diet-controlled.  Pt does not check her sugars at home.  Pt's meals are: - Breakfast: OJ, protein bar or egg and bacon - snack: coffee, yoghurt - Lunch: fast food (chicken nuggets, no fries; occasional soft drinks) - Dinner: can of soup or meat + veggies, some bread - Snacks: no  - no CKD, last BUN/creatinine:  03/13/2015: 22/0.77, eGFR 101 Lab Results  Component Value  Date   BUN 12 06/29/2007   CREATININE 0.8 06/29/2007   - last set of lipids: 03/13/2015: 180/84/40/123 No results found for: CHOL, HDL, LDLCALC, LDLDIRECT, TRIG, CHOLHDL - last eye exam was in 2017. No DR.  - no numbness and tingling in her feet.  Pt has FH of DM in mother, MGM, other family members.  ROS: Constitutional: + see HPI Eyes: no blurry vision, no xerophthalmia ENT: no sore throat, no nodules palpated in throat, no dysphagia/odynophagia, no hoarseness Cardiovascular: no CP/palpitations/leg swelling Respiratory: no cough/+ SOB Gastrointestinal: no N/V/D/C Musculoskeletal: no muscle/joint aches Skin: no rashes Neurological: no tremors/numbness/tingling/dizziness  I reviewed pt's medications, allergies, PMH, social hx, family hx, and changes were documented in the history of present illness. Otherwise, unchanged from my initial visit note.  Past Medical History:  Diagnosis Date  . Asthma   . Diabetes mellitus   . Hypertension   . Sleep apnea    Past Surgical History:  Procedure Laterality Date  . ABDOMINAL HYSTERECTOMY    . BILATERAL OOPHORECTOMY    . SINUS EXPLORATION  1996   Social History   Social History  . Marital status: Married    Spouse name: N/A  . Number of children: 2   Occupational History  . Buyer   Social History Main Topics  . Smoking status: Never Smoker  . Smokeless tobacco:  Never Used  . Alcohol use Yes     Comment: wine, once a week  . Drug use: No   Current Outpatient Prescriptions on File Prior to Visit  Medication Sig Dispense Refill  . cefdinir (OMNICEF) 300 MG capsule Take 1 capsule (300 mg total) by mouth 2 (two) times daily. 20 capsule 0  . estradiol (VIVELLE-DOT) 0.1 MG/24HR Place 1 patch onto the skin. Wear patch x7 days, replace with a new one.    . pseudoephedrine (SUDAFED) 120 MG 12 hr tablet Take 120 mg by mouth 2 (two) times daily.    . verapamil (CALAN) 80 MG tablet Take 1 tablet (80 mg total) by mouth 3 (three)  times daily. 90 tablet 5  . [DISCONTINUED] hydrochlorothiazide 25 MG tablet Take 25 mg by mouth daily.      . [DISCONTINUED] imipramine (TOFRANIL) 25 MG tablet Take 1 tablet (25 mg total) by mouth at bedtime. 15 tablet 0  . [DISCONTINUED] progesterone (PROMETRIUM) 100 MG capsule Take 100 mg by mouth daily.       No current facility-administered medications on file prior to visit.    Allergies  Allergen Reactions  . Azithromycin     REACTION: rash  . Codeine   . Food     shellfish  . Penicillins    Family History  Problem Relation Age of Onset  . Hypertension Mother   . Hyperlipidemia Mother   . Liver cancer Mother   . Allergies Mother   . Diabetes Mother   . Healthy Father   . Healthy      2 siblings   PE: BP 140/84 (BP Location: Left Arm, Patient Position: Sitting)   Pulse 64   Wt 153 lb (69.4 kg)   BMI 25.46 kg/m  Wt Readings from Last 3 Encounters:  06/06/16 153 lb (69.4 kg)  02/05/16 144 lb 9.6 oz (65.6 kg)  07/17/15 139 lb 12.8 oz (63.4 kg)   Constitutional: overweight, in NAD Eyes: PERRLA, EOMI, no exophthalmos ENT: moist mucous membranes, no thyromegaly, no cervical lymphadenopathy Cardiovascular: RRR, No MRG Respiratory: CTA B Gastrointestinal: abdomen soft, NT, ND, BS+ Musculoskeletal: no deformities, strength intact in all 4 Skin: moist, warm, no rashes Neurological: no tremor with outstretched hands, DTR normal in all 4   ASSESSMENT: 1. Hypothyroidism  2. Prediabetes  PLAN:  1. Patient with history of mild hypothyroidism (highest TSH available for review was 3.8). She was started on levothyroxine, low dose, 25 g daily which she had stopped before our last visit >> we continued her off the med >> she now restarted it 2 weeks ago 2/2 fatigue, weight gain/heat/cold intolerance. She feels better since she started and would like to continue.  - she does not appear to have a goiter, thyroid nodules, or neck compression symptoms - she r/o for Hashimoto;s  thyroiditis at last visit - We discussed about correct intake of levothyroxine - I advised her to: Take the thyroid hormone every day, with water, at least 30 minutes before breakfast, separated by at least 4 hours from: - acid reflux medications - calcium - iron - multivitamins  She is taking it correctly. - I will see the patient back in 6 months, but I advised her to come back in 1 mo for repeat TFTs  2. Patient with history of diabetes/prediabetes, diet-controlled. I reviewed available records, and her highest HbA1c was 6.1% after a course of steroids at the beginning of 2017. Before and after this date, patients HbA1c has stayed lower than  6%.  - since last visit >> she gained weight and she will try to lose it. I suspect HbA1c may be higher - We again discussed that prediabetes is a reversible condition and if she continues to pay attention to the diet, exercise, and keep her weight down, she may never developed diabetes - HbA1c checked today >> 6.1% >> higher - She is up-to-date with eye exams   - will check (in 1 mo, fasting): Orders Placed This Encounter  Procedures  . T4, free  . TSH  . COMPLETE METABOLIC PANEL WITH GFR  . Microalbumin / creatinine urine ratio  . Lipid panel  - Return to clinic in 6 mo with sugar log   [name], MD PhD [facility] Endocrinology

## 2016-06-15 DIAGNOSIS — I10 Essential (primary) hypertension: Secondary | ICD-10-CM | POA: Insufficient documentation

## 2016-06-15 DIAGNOSIS — D649 Anemia, unspecified: Secondary | ICD-10-CM | POA: Insufficient documentation

## 2016-06-15 DIAGNOSIS — L309 Dermatitis, unspecified: Secondary | ICD-10-CM

## 2016-06-15 DIAGNOSIS — Z9109 Other allergy status, other than to drugs and biological substances: Secondary | ICD-10-CM

## 2016-06-15 DIAGNOSIS — E1159 Type 2 diabetes mellitus with other circulatory complications: Secondary | ICD-10-CM | POA: Insufficient documentation

## 2016-06-15 DIAGNOSIS — J3089 Other allergic rhinitis: Secondary | ICD-10-CM | POA: Insufficient documentation

## 2016-06-15 HISTORY — DX: Dermatitis, unspecified: L30.9

## 2016-07-06 ENCOUNTER — Other Ambulatory Visit: Payer: 59

## 2016-08-05 ENCOUNTER — Ambulatory Visit (HOSPITAL_COMMUNITY)
Admission: EM | Admit: 2016-08-05 | Discharge: 2016-08-05 | Disposition: A | Discharge status: Home / Self Care | Payer: 59 | Attending: Family Medicine | Admitting: Family Medicine

## 2016-08-05 ENCOUNTER — Encounter (HOSPITAL_COMMUNITY): Payer: Self-pay | Admitting: Family Medicine

## 2016-08-05 ENCOUNTER — Ambulatory Visit (INDEPENDENT_AMBULATORY_CARE_PROVIDER_SITE_OTHER): Payer: 59

## 2016-08-05 DIAGNOSIS — J4541 Moderate persistent asthma with (acute) exacerbation: Secondary | ICD-10-CM | POA: Diagnosis not present

## 2016-08-05 DIAGNOSIS — J9801 Acute bronchospasm: Secondary | ICD-10-CM | POA: Diagnosis not present

## 2016-08-05 DIAGNOSIS — J301 Allergic rhinitis due to pollen: Secondary | ICD-10-CM

## 2016-08-05 MED ORDER — METHYLPREDNISOLONE ACETATE 40 MG/ML IJ SUSP
INTRAMUSCULAR | Status: AC
Start: 2016-08-05 — End: 2016-08-05
  Filled 2016-08-05: qty 1

## 2016-08-05 MED ORDER — IPRATROPIUM-ALBUTEROL 0.5-2.5 (3) MG/3ML IN SOLN
3.0000 mL | Freq: Once | RESPIRATORY_TRACT | Status: AC
  Administered 2016-08-05: 3 mL via RESPIRATORY_TRACT

## 2016-08-05 MED ORDER — DEXAMETHASONE SODIUM PHOSPHATE 10 MG/ML IJ SOLN
INTRAMUSCULAR | Status: AC
  Filled 2016-08-05: qty 1

## 2016-08-05 MED ORDER — MONTELUKAST SODIUM 10 MG PO TABS: 10.0000 mg | ORAL_TABLET | Freq: Every day | ORAL | 0 refills | Status: DC

## 2016-08-05 MED ORDER — DEXAMETHASONE SODIUM PHOSPHATE 10 MG/ML IJ SOLN
10.0000 mg | Freq: Once | INTRAMUSCULAR | Status: AC
  Administered 2016-08-05: 10 mg via INTRAMUSCULAR

## 2016-08-05 MED ORDER — IPRATROPIUM-ALBUTEROL 0.5-2.5 (3) MG/3ML IN SOLN
RESPIRATORY_TRACT | Status: AC
Start: 2016-08-05 — End: 2016-08-05
  Filled 2016-08-05: qty 3

## 2016-08-05 MED ORDER — METHYLPREDNISOLONE ACETATE 40 MG/ML IJ SUSP
40.0000 mg | Freq: Once | INTRAMUSCULAR | Status: AC
  Administered 2016-08-05: 40 mg via INTRAMUSCULAR

## 2016-08-05 NOTE — Discharge Instructions (Signed)
Continue to use the albuterol HFA inhaler 2 puffs every 4 hours as needed. He can try starting the Singulair 10 mg at night time as directed. Continue taking the nonsedating antihistamine of your choice and add Chlor-Trimeton 2 or 4 mg every 4 hours as needed, this can cause drowsiness. Saline nasal spray uses often as needed you cannot overdose on this. Change or decongestant to Sudafed PE, generic name phenylephrine 10 mg every 4 hours. Drink plenty of fluids, stay well-hydrated

## 2016-08-05 NOTE — ED Triage Notes (Signed)
ot here for cough, wheezing, SOB. sts she has been treated with prednisone and been taking albuterol inhaler and not getting better.

## 2016-08-05 NOTE — ED Provider Notes (Signed)
CSN: [id number]     Arrival date & time 08/05/16  1437 History   First MD Initiated Contact with Patient 08/05/16 1525     Chief Complaint  Patient presents with  . Shortness of Breath  . Wheezing   (Consider location/radiation/quality/duration/timing/severity/associated sxs/prior Treatment) 61 year old female with a history of asthma and allergic rhinitis states that she had an exacerbation of both approximately one week ago. She went to an urgent care and was treated with apparent albuterol neb and prescribed albuterol HFA and a six-day prednisone taper pack. She has also taken Sudafed, Claritin, Zyrtec and other over-the-counter medicines but continues to have cough, breast were congestion, PND and other allergic rhinitis symptoms. She is using her albuterol HFA every 4 hours with the last dose around 1 to 1:30 PM today.         Past Medical History:  Diagnosis Date  . Asthma   . Diabetes mellitus   . Hypertension   . Sleep apnea    Past Surgical History:  Procedure Laterality Date  . ABDOMINAL HYSTERECTOMY    . BILATERAL OOPHORECTOMY    . SINUS EXPLORATION  1996   Family History  Problem Relation Age of Onset  . Hypertension Mother   . Hyperlipidemia Mother   . Liver cancer Mother   . Allergies Mother   . Diabetes Mother   . Healthy Father   . Healthy Unknown        2 siblings   Social History  Substance Use Topics  . Smoking status: Never Smoker  . Smokeless tobacco: Never Used  . Alcohol use Yes     Comment: less than once a month   OB History    No data available     Review of Systems  Constitutional: Negative.  Negative for activity change, appetite change, chills, fatigue and fever.  HENT: Positive for congestion, postnasal drip and rhinorrhea. Negative for facial swelling and sore throat.   Eyes: Negative.   Respiratory: Positive for cough and wheezing.   Cardiovascular: Negative.   Musculoskeletal: Negative for neck pain and neck stiffness.   Skin: Negative for pallor and rash.  Neurological: Negative.   All other systems reviewed and are negative.   Allergies  Azithromycin; Codeine; Food; and Penicillins  Home Medications   Prior to Admission medications   Medication Sig Start Date End Date Taking? Authorizing Provider  atenolol (TENORMIN) 25 MG tablet Take 1 tablet (25 mg total) by mouth daily. 06/06/16   [name], MD  estradiol (VIVELLE-DOT) 0.1 MG/24HR Place 1 patch onto the skin. Wear patch x7 days, replace with a new one.    [provider]  levothyroxine (SYNTHROID, LEVOTHROID) 25 MCG tablet Take 1 tablet (25 mcg total) by mouth daily before breakfast. 06/06/16   [name], MD  montelukast (SINGULAIR) 10 MG tablet Take 1 tablet (10 mg total) by mouth at bedtime. 08/05/16   Janne Napoleon, NP  pseudoephedrine (SUDAFED) 120 MG 12 hr tablet Take 120 mg by mouth 2 (two) times daily.    [provider]  verapamil (CALAN) 80 MG tablet Take 1 tablet (80 mg total) by mouth 3 (three) times daily. 07/17/15   Deneise Lever, MD   Meds Ordered and Administered this Visit   Medications  ipratropium-albuterol (DUONEB) 0.5-2.5 (3) MG/3ML nebulizer solution 3 mL (3 mLs Nebulization Given 08/05/16 1549)  dexamethasone (DECADRON) injection 10 mg (10 mg Intramuscular Given 08/05/16 1549)  methylPREDNISolone acetate (DEPO-MEDROL) injection 40 mg (40 mg Intramuscular Given 08/05/16 1549)  BP 133/77   Pulse 70   Resp 18   SpO2 96%  No data found.   Physical Exam  Constitutional: She is oriented to person, place, and time. She appears well-developed and well-nourished.  HENT:  Bilateral TMs are normal. Oropharynx with minor streaky erythema and scant clear PND. No exudates or swelling. Airway widely patent.  Eyes: EOM are normal.  Neck: Normal range of motion. Neck supple.  Cardiovascular: Normal rate, regular rhythm and normal heart sounds.   Pulmonary/Chest: Effort normal.  Slightly decreased  airway movement. Tidal volume is clear. Forced expiration with distant wheeze, forced cough with bilateral coarseness.  Musculoskeletal: Normal range of motion.  Lymphadenopathy:    She has no cervical adenopathy.  Neurological: She is alert and oriented to person, place, and time.  Skin: Skin is warm.  Psychiatric: She has a normal mood and affect.  Nursing note and vitals reviewed.   Urgent Care Course     Procedures (including critical care time)  Labs Review Labs Reviewed - No data to display  Imaging Review Dg Chest 2 View  Result Date: 08/05/2016 CLINICAL DATA:  Cough and chest congestion.  Dyspnea. EXAM: CHEST  2 VIEW COMPARISON:  03/27/2015 FINDINGS: The heart size and mediastinal contours are within normal limits. Both lungs are clear. The visualized skeletal structures are unremarkable. IMPRESSION: Normal exam. Electronically Signed   By: Lorriane Shire M.D.   On: 08/05/2016 16:41     Visual Acuity Review  Right Eye Distance:   Left Eye Distance:   Bilateral Distance:    Right Eye Near:   Left Eye Near:    Bilateral Near:         MDM   1. Moderate persistent asthma with exacerbation   2. Bronchospasm   3. Seasonal allergic rhinitis due to pollen   Post DuoNeb and injections patient states she is feeling much better, breathing much better and feels Ready to go home. Continue to use the albuterol HFA inhaler 2 puffs every 4 hours as needed. He can try starting the Singulair 10 mg at night time as directed. Continue taking the nonsedating antihistamine of your choice and add Chlor-Trimeton 2 or 4 mg every 4 hours as needed, this can cause drowsiness. Saline nasal spray uses often as needed you cannot overdose on this. Change or decongestant to Sudafed PE, generic name phenylephrine 10 mg every 4 hours. Drink plenty of fluids, stay well-hydrated Meds ordered this encounter  Medications  . ipratropium-albuterol (DUONEB) 0.5-2.5 (3) MG/3ML nebulizer solution 3 mL   . dexamethasone (DECADRON) injection 10 mg  . methylPREDNISolone acetate (DEPO-MEDROL) injection 40 mg  . montelukast (SINGULAIR) 10 MG tablet    Sig: Take 1 tablet (10 mg total) by mouth at bedtime.    Dispense:  30 tablet    Refill:  0    Order Specific Question:   Supervising Provider    Answer:   Robyn Haber [[id number]]       [facility], LEXNT, NP 08/05/16 1719    Janne Napoleon, NP 08/05/16 1720

## 2016-09-19 ENCOUNTER — Encounter: Payer: Self-pay | Admitting: Allergy and Immunology

## 2016-09-19 ENCOUNTER — Ambulatory Visit (INDEPENDENT_AMBULATORY_CARE_PROVIDER_SITE_OTHER): Payer: PRIVATE HEALTH INSURANCE | Admitting: Allergy and Immunology

## 2016-09-19 VITALS — BP 134/78 | HR 60 | Temp 97.7°F | Resp 16 | Ht 64.0 in | Wt 151.0 lb

## 2016-09-19 DIAGNOSIS — J3089 Other allergic rhinitis: Secondary | ICD-10-CM | POA: Insufficient documentation

## 2016-09-19 DIAGNOSIS — J310 Chronic rhinitis: Secondary | ICD-10-CM | POA: Diagnosis not present

## 2016-09-19 DIAGNOSIS — J328 Other chronic sinusitis: Secondary | ICD-10-CM | POA: Diagnosis not present

## 2016-09-19 DIAGNOSIS — J4530 Mild persistent asthma, uncomplicated: Secondary | ICD-10-CM | POA: Diagnosis not present

## 2016-09-19 DIAGNOSIS — T7800XA Anaphylactic reaction due to unspecified food, initial encounter: Secondary | ICD-10-CM | POA: Insufficient documentation

## 2016-09-19 DIAGNOSIS — T7800XD Anaphylactic reaction due to unspecified food, subsequent encounter: Secondary | ICD-10-CM | POA: Diagnosis not present

## 2016-09-19 DIAGNOSIS — T485X1A Poisoning by other anti-common-cold drugs, accidental (unintentional), initial encounter: Secondary | ICD-10-CM

## 2016-09-19 DIAGNOSIS — T485X5A Adverse effect of other anti-common-cold drugs, initial encounter: Secondary | ICD-10-CM

## 2016-09-19 MED ORDER — MONTELUKAST SODIUM 10 MG PO TABS: 10.0000 mg | ORAL_TABLET | Freq: Every day | ORAL | 5 refills | Status: DC

## 2016-09-19 MED ORDER — EPINEPHRINE 0.3 MG/0.3ML IJ SOAJ: 0.3000 mg | Freq: Once | INTRAMUSCULAR | 2 refills | Status: DC

## 2016-09-19 MED ORDER — BUDESONIDE 0.5 MG/2ML IN SUSP: 0.5000 mg | Freq: Two times a day (BID) | RESPIRATORY_TRACT | 3 refills | Status: DC

## 2016-09-19 MED ORDER — CARBINOXAMINE MALEATE 6 MG PO TABS: 6.0000 mg | ORAL_TABLET | Freq: Four times a day (QID) | ORAL | 5 refills | Status: DC | PRN

## 2016-09-19 NOTE — Assessment & Plan Note (Signed)
   Prednisone has been provided, 40 mg x3 days, 20 mg x1 day, 10 mg x1 day, then stop.  The patient has been asked to discontinue oxymetazoline (Afrin).  Treatment plan as outlined above.

## 2016-09-19 NOTE — Assessment & Plan Note (Signed)
   Meticulous avoidance of shellfish as discussed.  A prescription has been provided for epinephrine auto-injector 2 pack along with instructions for proper administration.  A food allergy action plan has been provided and discussed.  Medic Alert identification is recommended.

## 2016-09-19 NOTE — Patient Instructions (Addendum)
Perennial allergic rhinitis  Aeroallergen avoidance measures have been discussed and provided in written form.  A prescription has been provided for RyVent (carbinoxamine maleate) 6mg  every 6-8 hours as needed.  Start budesonide/saline nasal irrigation twice a day.  A prescription has been provided for budesonide 0.5 mg respules and instructions for mixing and adminstering the rinse have been discussed and provided in written form.  If allergen avoidance measures and medications fail to adequately relieve symptoms, aeroallergen immunotherapy will be considered.  Rhinitis medicamentosa  Prednisone has been provided, 40 mg x3 days, 20 mg x1 day, 10 mg x1 day, then stop.  The patient has been asked to discontinue oxymetazoline (Afrin).  Treatment plan as outlined above.  Mild persistent asthma A prescription has been provided for montelukast 10 mg daily at bedtime. Continue albuterol HFA, 1-2 inhalations every 4-6 hours as needed and 10-15 minutes prior to exercise. Subjective and objective measures of pulmonary function will be followed and the treatment plan will be adjusted accordingly.  Allergy with anaphylaxis due to food  Meticulous avoidance of shellfish as discussed.  A prescription has been provided for epinephrine auto-injector 2 pack along with instructions for proper administration.  A food allergy action plan has been provided and discussed.  Medic Alert identification is recommended.   Return in about 3 months (around 12/20/2016), or if symptoms worsen or fail to improve.  Budesonide (Pulmicort) + Saline Irrigation/Rinse  Budesonide (Pulmicort) is an anti-inflammatory steroid medication used to decrease nasal and sinus inflammation. It is dispensed in liquid form in a vial. Although it is manufactured for use with a nebulizer, we intend for you to use it with the NeilMed Sinus Rinse bottle (preferred) or a Neti pot.   Instructions:  1) Make 240cc of saline in the  NeilMed bottle using the salt packets or your own saline recipe (see separate handout).  2) Add the entire 2cc vial of liquid Budesonide (Pulmicort) to the rinse bottle and mix together.  3) While in the shower or over the sink, tilt your head forward to a comfortable level. Put the tip of the sinus rinse bottle in your nostril and aim it towards the crown or top of your head. Gently squeeze the bottle to flush out your nose. The fluid will circulate in and out of your sinus cavities, coming back out from either nostril or through your mouth. Try not to swallow large quantities and spit it out instead.  4) Perform Budesonide (Pulmicort) + Saline irrigations 2 times daily.   Control of Dog or Cat Allergen  Avoidance is the best way to manage a dog or cat allergy. If you have a dog or cat and are allergic to dog or cats, consider removing the dog or cat from the home. If you have a dog or cat but don't want to find it a new home, or if your family wants a pet even though someone in the household is allergic, here are some strategies that may help keep symptoms at bay:  1. Keep the pet out of your bedroom and restrict it to only a few rooms. Be advised that keeping the dog or cat in only one room will not limit the allergens to that room. 2. Don't pet, hug or kiss the dog or cat; if you do, wash your hands with soap and water. 3. High-efficiency particulate air (HEPA) cleaners run continuously in a bedroom or living room can reduce allergen levels over time. 4. Place electrostatic material sheet in the air  inlet vent in the bedroom. 5. Regular use of a high-efficiency vacuum cleaner or a central vacuum can reduce allergen levels. 6. Giving your dog or cat a bath at least once a week can reduce airborne allergen.  Control of House Dust Mite Allergen  House dust mites play a major role in allergic asthma and rhinitis.  They occur in environments with high humidity wherever human skin, the food for  dust mites is found. High levels have been detected in dust obtained from mattresses, pillows, carpets, upholstered furniture, bed covers, clothes and soft toys.  The principal allergen of the house dust mite is found in its feces.  A gram of dust may contain 1,000 mites and 250,000 fecal particles.  Mite antigen is easily measured in the air during house cleaning activities.    1. Encase mattresses, including the box spring, and pillow, in an air tight cover.  Seal the zipper end of the encased mattresses with wide adhesive tape. 2. Wash the bedding in water of 130 degrees Farenheit weekly.  Avoid cotton comforters/quilts and flannel bedding: the most ideal bed covering is the dacron comforter. 3. Remove all upholstered furniture from the bedroom. 4. Remove carpets, carpet padding, rugs, and non-washable window drapes from the bedroom.  Wash drapes weekly or use plastic window coverings. 5. Remove all non-washable stuffed toys from the bedroom.  Wash stuffed toys weekly. 6. Have the room cleaned frequently with a vacuum cleaner and a damp dust-mop.  The patient should not be in a room which is being cleaned and should wait 1 hour after cleaning before going into the room. 7. Close and seal all heating outlets in the bedroom.  Otherwise, the room will become filled with dust-laden air.  An electric heater can be used to heat the room. 8. Reduce indoor humidity to less than 50%.  Do not use a humidifier.  Control of Mold Allergen  Mold and fungi can grow on a variety of surfaces provided certain temperature and moisture conditions exist.  Outdoor molds grow on plants, decaying vegetation and soil.  The major outdoor mold, Alternaria and Cladosporium, are found in very high numbers during hot and dry conditions.  Generally, a late Summer - Fall peak is seen for common outdoor fungal spores.  Rain will temporarily lower outdoor mold spore count, but counts rise rapidly when the rainy period ends.  The  most important indoor molds are Aspergillus and Penicillium.  Dark, humid and poorly ventilated basements are ideal sites for mold growth.  The next most common sites of mold growth are the bathroom and the kitchen.  Outdoor [employer] 1. Use air conditioning and keep windows closed 2. Avoid exposure to decaying vegetation. 3. Avoid leaf raking. 4. Avoid grain handling. 5. Consider wearing a face mask if working in moldy areas.  Indoor Mold Control 1. Maintain humidity below 50%. 2. Clean washable surfaces with 5% bleach solution. 3. Remove sources e.g. Contaminated carpets.

## 2016-09-19 NOTE — Assessment & Plan Note (Addendum)
   Aeroallergen avoidance measures have been discussed and provided in written form.  A prescription has been provided for RyVent (carbinoxamine maleate) 6mg  every 6-8 hours as needed.  Start budesonide/saline nasal irrigation twice a day.  A prescription has been provided for budesonide 0.5 mg respules and instructions for mixing and adminstering the rinse have been discussed and provided in written form.  If allergen avoidance measures and medications fail to adequately relieve symptoms, aeroallergen immunotherapy will be considered.

## 2016-09-19 NOTE — Progress Notes (Signed)
New Patient Note  RE: Dominique Brock MRN: [id number] DOB: Dec 06, 1955 Date of Office Visit: 09/19/2016  Referring provider: No ref. provider found Primary care provider: Patient, No Pcp Per  Chief Complaint: Nasal Congestion; Sinus Problem; and Asthma   History of present illness: Dominique Brock is a 61 y.o. female presenting today for evaluation of rhinosinusitis and asthma.  She had been under the care of Dominique Brock. She complains of nasal congestion, thick postnasal drainage, ear pressure, and sinus pressure between the eyes, over the forehead, and over the cheek bones.  She states the symptoms are and "every day battle."  The symptoms occur year around but she believes that they tend to be more frequent and severe with tree and grass pollen exposure.  She states that she has "tried everything" without adequate symptom relief.  Phenylephrine and chlorpheniramine were tried most recently with some benefit.  She admits to using oxymetazoline multiple times daily and has used this nasal spray over the past decade.  She has been weaned from oxymetazoline on a few occasions with prednisone but has resumed using this nasal spray.  She states that every December she develops severe sinusitis and bronchitis.  She had sinus surgery in 1998.   She is diagnosed with asthma and 2001.  Her asthma symptoms include coughing, dyspnea, chest tightness, and wheezing.  Her asthma triggers include going outdoors, upper respiratory tract infections, exercise, and extremes of temperature.  She went to the urgent care approximately 6 weeks ago due to severe allergy and asthma symptoms which she presumed to be triggered by pollen exposure.  She was given a 30 day prescription for montelukast which provided significant relief of her asthma symptoms, however this prescription ran out 2 weeks ago and her symptoms have returned.  She currently experiences asthma symptoms several times per week on average and has  nocturnal awakenings due to lower respiratory symptoms multiple nights per month. When Dominique Brock was in her 42s, she consumed shellfish and rapidly developed oral pruritus, pharyngeal pruritus, and was syncopal.  She had reactive skin tests to shellfish in 1999.  She has avoided shellfish since that time but no longer has an epinephrine autoinjector.  She is not interested in being retested to shellfish.   Assessment and plan: Perennial allergic rhinitis  Aeroallergen avoidance measures have been discussed and provided in written form.  A prescription has been provided for RyVent (carbinoxamine maleate) 6mg  every 6-8 hours as needed.  Start budesonide/saline nasal irrigation twice a day.  A prescription has been provided for budesonide 0.5 mg respules and instructions for mixing and adminstering the rinse have been discussed and provided in written form.  If allergen avoidance measures and medications fail to adequately relieve symptoms, aeroallergen immunotherapy will be considered.  Rhinitis medicamentosa  Prednisone has been provided, 40 mg x3 days, 20 mg x1 day, 10 mg x1 day, then stop.  The patient has been asked to discontinue oxymetazoline (Afrin).  Treatment plan as outlined above.  Mild persistent asthma A prescription has been provided for montelukast 10 mg daily at bedtime. Continue albuterol HFA, 1-2 inhalations every 4-6 hours as needed and 10-15 minutes prior to exercise. Subjective and objective measures of pulmonary function will be followed and the treatment plan will be adjusted accordingly.  Allergy with anaphylaxis due to food  Meticulous avoidance of shellfish as discussed.  A prescription has been provided for epinephrine auto-injector 2 pack along with instructions for proper administration.  A food allergy action plan  has been provided and discussed.  Medic Alert identification is recommended.   Meds ordered this encounter  Medications  . Carbinoxamine  Maleate (RYVENT) 6 MG TABS    Sig: Take 6 mg by mouth every 6 (six) hours as needed.    Dispense:  120 tablet    Refill:  5    Bin# [id number] RxPCN# [id number] Group# [id number] Cardholder ID# [id number]  . budesonide (PULMICORT) 0.5 MG/2ML nebulizer solution    Sig: Take 2 mLs (0.5 mg total) by nebulization 2 (two) times daily.    Dispense:  120 mL    Refill:  3  . montelukast (SINGULAIR) 10 MG tablet    Sig: Take 1 tablet (10 mg total) by mouth at bedtime.    Dispense:  30 tablet    Refill:  5  . EPINEPHrine (AUVI-Q) 0.3 mg/0.3 mL IJ SOAJ injection    Sig: Inject 0.3 mLs (0.3 mg total) into the muscle once.    Dispense:  1 Device    Refill:  2    [phone]    Diagnostics: Spirometry:  Normal with an FEV1 of 105% predicted.  Please see scanned spirometry results for details. Epicutaneous testing:  Negative despite a positive histamine control. Intradermal testing: Positive to molds, dog epithelia, and dust mite antigen.    Physical examination: Blood pressure 134/78, pulse 60, temperature 97.7 F (36.5 C), temperature source Oral, resp. rate 16, height 5\' 4"  (1.626 m), weight 151 lb (68.5 kg), SpO2 97 %.  General: Alert, interactive, in no acute distress. HEENT: TMs pearly gray, turbinates moderately edematous with thick discharge, post-pharynx moderately erythematous. Neck: Supple without lymphadenopathy. Lungs: Clear to auscultation without wheezing, rhonchi or rales. CV: Normal S1, S2 without murmurs. Abdomen: Nondistended, nontender. Skin: Warm and dry, without lesions or rashes. Extremities:  No clubbing, cyanosis or edema. Neuro:   Grossly intact.  Review of systems:  Review of systems negative except as noted in HPI / PMHx or noted below: Review of Systems  Constitutional: Negative.   HENT: Negative.   Eyes: Negative.   Respiratory: Negative.   Cardiovascular: Negative.   Gastrointestinal: Negative.   Genitourinary: Negative.   Musculoskeletal: Negative.   Skin:  Negative.   Neurological: Negative.   Endo/Heme/Allergies: Negative.   Psychiatric/Behavioral: Negative.     Past medical history:  Past Medical History:  Diagnosis Date  . Asthma   . Diabetes mellitus   . Hypertension   . Sleep apnea     Past surgical history:  Past Surgical History:  Procedure Laterality Date  . ABDOMINAL HYSTERECTOMY    . BILATERAL OOPHORECTOMY    . SINUS EXPLORATION  1996    Family history: Family History  Problem Relation Age of Onset  . Hypertension Mother   . Hyperlipidemia Mother   . Liver cancer Mother   . Allergies Mother   . Diabetes Mother   . Healthy Father   . Healthy Unknown        2 siblings  . Asthma Maternal Grandmother   . Asthma Cousin   . Allergic rhinitis Neg Hx   . Angioedema Neg Hx   . Eczema Neg Hx   . Immunodeficiency Neg Hx   . Urticaria Neg Hx     Social history: Social History   Social History  . Marital status: Married    Spouse name: N/A  . Number of children: 2  . Years of education: N/A   Occupational History  . Not on file.   Social History Main Topics  .  Smoking status: Never Smoker  . Smokeless tobacco: Never Used  . Alcohol use Yes     Comment: less than once a month  . Drug use: No  . Sexual activity: Yes   Other Topics Concern  . Not on file   Social History Narrative  . No narrative on file   Environmental History: The patient lives in a 59 year old house with hardwood floors throughout, gas heat, and central air.  There is no known mold/water damage in the home.  There are 2 dogs in the home which do not have access to her bedroom.  She is a nonsmoker.  Allergies as of 09/19/2016      Reactions   Azithromycin Rash   REACTION: rash   Codeine Nausea And Vomiting   Penicillins Rash   Food    shellfish      Medication List       Accurate as of 09/19/16  1:24 PM. Always use your most recent med list.          atenolol 25 MG tablet Commonly known as:  TENORMIN Take 1 tablet (25  mg total) by mouth daily.   budesonide 0.5 MG/2ML nebulizer solution Commonly known as:  PULMICORT Take 2 mLs (0.5 mg total) by nebulization 2 (two) times daily.   Carbinoxamine Maleate 6 MG Tabs Commonly known as:  RYVENT Take 6 mg by mouth every 6 (six) hours as needed.   EPINEPHrine 0.3 mg/0.3 mL Soaj injection Commonly known as:  AUVI-Q Inject 0.3 mLs (0.3 mg total) into the muscle once.   estradiol 0.1 MG/24HR patch Commonly known as:  VIVELLE-DOT Place 1 patch onto the skin. Wear patch x7 days, replace with a new one.   montelukast 10 MG tablet Commonly known as:  SINGULAIR Take 1 tablet (10 mg total) by mouth at bedtime.   pseudoephedrine 120 MG 12 hr tablet Commonly known as:  SUDAFED Take 120 mg by mouth 2 (two) times daily.   VENTOLIN HFA 108 (90 Base) MCG/ACT inhaler Generic drug:  albuterol Ventolin HFA 90 mcg/actuation aerosol inhaler  Inhale 2 puffs every 4 hours by inhalation route as needed.       Known medication allergies: Allergies  Allergen Reactions  . Azithromycin Rash    REACTION: rash  . Codeine Nausea And Vomiting  . Penicillins Rash  . Food     shellfish    I appreciate the opportunity to take part in Jaelynn's care. Please do not hesitate to contact me with questions.  Sincerely,   R. Edgar Frisk, MD

## 2016-09-19 NOTE — Assessment & Plan Note (Signed)
A prescription has been provided for montelukast 10 mg daily at bedtime. Continue albuterol HFA, 1-2 inhalations every 4-6 hours as needed and 10-15 minutes prior to exercise. Subjective and objective measures of pulmonary function will be followed and the treatment plan will be adjusted accordingly.

## 2016-09-26 ENCOUNTER — Telehealth: Payer: Self-pay

## 2016-09-26 ENCOUNTER — Ambulatory Visit (INDEPENDENT_AMBULATORY_CARE_PROVIDER_SITE_OTHER): Payer: PRIVATE HEALTH INSURANCE | Admitting: Allergy and Immunology

## 2016-09-26 ENCOUNTER — Encounter: Payer: Self-pay | Admitting: Allergy and Immunology

## 2016-09-26 VITALS — BP 130/70 | HR 55 | Resp 18

## 2016-09-26 DIAGNOSIS — T7800XD Anaphylactic reaction due to unspecified food, subsequent encounter: Secondary | ICD-10-CM

## 2016-09-26 DIAGNOSIS — J3089 Other allergic rhinitis: Secondary | ICD-10-CM | POA: Diagnosis not present

## 2016-09-26 DIAGNOSIS — T485X5A Adverse effect of other anti-common-cold drugs, initial encounter: Secondary | ICD-10-CM

## 2016-09-26 DIAGNOSIS — J310 Chronic rhinitis: Secondary | ICD-10-CM | POA: Diagnosis not present

## 2016-09-26 DIAGNOSIS — T485X1A Poisoning by other anti-common-cold drugs, accidental (unintentional), initial encounter: Secondary | ICD-10-CM

## 2016-09-26 DIAGNOSIS — J4541 Moderate persistent asthma with (acute) exacerbation: Secondary | ICD-10-CM

## 2016-09-26 DIAGNOSIS — J320 Chronic maxillary sinusitis: Secondary | ICD-10-CM | POA: Diagnosis not present

## 2016-09-26 MED ORDER — PREDNISONE 1 MG PO TABS: 10.0000 mg | ORAL_TABLET | Freq: Every day | ORAL | Status: AC

## 2016-09-26 MED ORDER — BUDESONIDE 0.5 MG/2ML IN SUSP: 0.5000 mg | Freq: Two times a day (BID) | RESPIRATORY_TRACT | 3 refills | Status: DC

## 2016-09-26 MED ORDER — FLUTICASONE PROPIONATE HFA 110 MCG/ACT IN AERO: 2.0000 | INHALATION_SPRAY | Freq: Two times a day (BID) | RESPIRATORY_TRACT | 5 refills | Status: DC

## 2016-09-26 MED ORDER — ALBUTEROL SULFATE HFA 108 (90 BASE) MCG/ACT IN AERS: INHALATION_SPRAY | RESPIRATORY_TRACT | 1 refills | Status: DC

## 2016-09-26 NOTE — Progress Notes (Signed)
Follow-up Note  RE: Dominique Brock MRN: [id number] DOB: 09/24/1955 Date of Office Visit: 09/26/2016  Primary care provider: Patient, No Pcp Per Referring provider: No ref. provider found  History of present illness: Dominique Brock is a 61 y.o. female with persistent asthma, allergic rhinitis, and food allergy presenting today for sick visit.  She was last seen in this clinic on 09/19/2016.  She reports that his symptoms she discontinues steroids, her symptoms "pop back", including asthma symptoms, nasal congestion, and "terrible" sinus pressure.  She has been experiencing persistent coughing, as well as some wheezing and dyspnea.  She admits that she did not start RyVent and his only been using the nasal steroid rinses once a day rather than twice a day as recommended. She is interested in the possibility of starting aeroallergen immunotherapy to reduce symptoms and decrease medication requirement.  She avoids shellfish and has access to epinephrine auto-injectors.   Assessment and plan: Sinusitis, chronic  Increase nasal steroid rinses to twice a day rather than once daily.  Start RyVent 6 mg every 6-8 hours as needed.  If symptoms persist or progress over the next few days despite compliance with this treatment plan, she will start prednisone, 20 mg x 4 days, 10 mg x1 day, then stop.  A referral will be made to Dr. Benjamine Mola, otolaryngology, for further evaluation.  Rhinitis medicamentosa  Treatment plan as outlined above.  Continue avoidance of oxymetazoline (Afrin).  Perennial allergic rhinitis  Continue appropriate allergen avoidance measures and treatment plan as outlined above for chronic sinusitis. The risks and benefits of aeroallergen immunotherapy have been discussed. The patient is motivated to initiate immunotherapy if insurance coverage is favorable. She will let Dominique Brock know how she would like to proceed.  Moderate persistent asthma Currently with suboptimal control.  A  prescription has been provided for Flovent 110 g, 2 inhalations twice a day. To maximize pulmonary deposition, a spacer has been provided along with instructions for its proper administration with an HFA inhaler.  For now, continue montelukast 10 mg daily bedtime and albuterol HFA, 1-2 inhalations every 4-6 hours as needed.  The patient has been asked to contact me if her symptoms persist or progress. Otherwise, she may return for follow up in 4 months.  Allergy with anaphylaxis due to food  Continue careful avoidance of shellfish and have access to epinephrine autoinjector 2 pack in case of accidental ingestion.  Food allergy action plan is in place.   Meds ordered this encounter  Medications  . fluticasone (FLOVENT HFA) 110 MCG/ACT inhaler    Sig: Inhale 2 puffs into the lungs 2 (two) times daily.    Dispense:  1 Inhaler    Refill:  5  . budesonide (PULMICORT) 0.5 MG/2ML nebulizer solution    Sig: Take 2 mLs (0.5 mg total) by nebulization 2 (two) times daily.    Dispense:  120 mL    Refill:  3  . predniSONE (DELTASONE) tablet 10 mg    Diagnostics: Spirometry:  Normal with an FEV1 of 2.53 L.  This study was performed while the patient was asymptomatic.  Please see scanned spirometry results for details.      Physical examination: Blood pressure 130/70, pulse (!) 55, resp. rate 18, SpO2 97 %.  General: Alert, interactive, in no acute distress. HEENT: TMs pearly gray, turbinates edematous with clear discharge, post-pharynx moderately erythematous. Neck: Supple without lymphadenopathy. Lungs: Clear to auscultation without wheezing, rhonchi or rales. CV: Normal S1, S2 without murmurs. Skin: Warm  and dry, without lesions or rashes.  The following portions of the patient's history were reviewed and updated as appropriate: allergies, current medications, past family history, past medical history, past social history, past surgical history and problem list.  Allergies as of  09/26/2016      Reactions   Azithromycin Rash   REACTION: rash   Codeine Nausea And Vomiting   Penicillins Rash   Food    shellfish      Medication List       Accurate as of 09/26/16  6:21 PM. Always use your most recent med list.          albuterol 108 (90 Base) MCG/ACT inhaler Commonly known as:  VENTOLIN HFA Ventolin HFA 90 mcg/actuation aerosol inhaler  Inhale 2 puffs every 4 hours by inhalation route as needed.   atenolol 25 MG tablet Commonly known as:  TENORMIN Take 1 tablet (25 mg total) by mouth daily.   budesonide 0.5 MG/2ML nebulizer solution Commonly known as:  PULMICORT Take 2 mLs (0.5 mg total) by nebulization 2 (two) times daily.   Carbinoxamine Maleate 6 MG Tabs Commonly known as:  RYVENT Take 6 mg by mouth every 6 (six) hours as needed.   EPINEPHrine 0.3 mg/0.3 mL Soaj injection Commonly known as:  AUVI-Q Inject 0.3 mLs (0.3 mg total) into the muscle once.   estradiol 0.1 MG/24HR patch Commonly known as:  VIVELLE-DOT Place 1 patch onto the skin. Wear patch x7 days, replace with a new one.   fluticasone 110 MCG/ACT inhaler Commonly known as:  FLOVENT HFA Inhale 2 puffs into the lungs 2 (two) times daily.   montelukast 10 MG tablet Commonly known as:  SINGULAIR Take 1 tablet (10 mg total) by mouth at bedtime.   pseudoephedrine 120 MG 12 hr tablet Commonly known as:  SUDAFED Take 120 mg by mouth 2 (two) times daily.       Allergies  Allergen Reactions  . Azithromycin Rash    REACTION: rash  . Codeine Nausea And Vomiting  . Penicillins Rash  . Food     shellfish   Review of systems: Review of systems negative except as noted in HPI / PMHx or noted below: Constitutional: Negative.  HENT: Negative.   Eyes: Negative.  Respiratory: Negative.   Cardiovascular: Negative.  Gastrointestinal: Negative.  Genitourinary: Negative.  Musculoskeletal: Negative.  Neurological: Negative.  Endo/Heme/Allergies: Negative.  Cutaneous:  Negative.  Past Medical History:  Diagnosis Date  . Asthma   . Diabetes mellitus   . Hypertension   . Sleep apnea     Family History  Problem Relation Age of Onset  . Hypertension Mother   . Hyperlipidemia Mother   . Liver cancer Mother   . Allergies Mother   . Diabetes Mother   . Healthy Father   . Healthy Unknown        2 siblings  . Asthma Maternal Grandmother   . Asthma Cousin   . Allergic rhinitis Neg Hx   . Angioedema Neg Hx   . Eczema Neg Hx   . Immunodeficiency Neg Hx   . Urticaria Neg Hx     Social History   Social History  . Marital status: Married    Spouse name: N/A  . Number of children: 2  . Years of education: N/A   Occupational History  . Not on file.   Social History Main Topics  . Smoking status: Never Smoker  . Smokeless tobacco: Never Used  . Alcohol use Yes  Comment: less than once a month  . Drug use: No  . Sexual activity: Yes   Other Topics Concern  . Not on file   Social History Narrative  . No narrative on file    I appreciate the opportunity to take part in Alethea's care. Please do not hesitate to contact me with questions.  Sincerely,   R. Edgar Frisk, MD

## 2016-09-26 NOTE — Patient Instructions (Signed)
Sinusitis, chronic  Increase nasal steroid rinses to twice a day rather than once daily.  Start RyVent 6 mg every 6-8 hours as needed.  If symptoms persist or progress over the next few days despite compliance with this treatment plan, she will start prednisone, 20 mg x 4 days, 10 mg x1 day, then stop.  A referral will be made to Dr. Benjamine Mola, otolaryngology, for further evaluation.  Rhinitis medicamentosa  Treatment plan as outlined above.  Continue avoidance of oxymetazoline (Afrin).  Perennial allergic rhinitis  Continue appropriate allergen avoidance measures and treatment plan as outlined above for chronic sinusitis. The risks and benefits of aeroallergen immunotherapy have been discussed. The patient is motivated to initiate immunotherapy if insurance coverage is favorable. She will let us know how she would like to proceed.  Moderate persistent asthma Currently with suboptimal control.  A prescription has been provided for Flovent 110 g, 2 inhalations twice a day. To maximize pulmonary deposition, a spacer has been provided along with instructions for its proper administration with an HFA inhaler.  For now, continue montelukast 10 mg daily bedtime and albuterol HFA, 1-2 inhalations every 4-6 hours as needed.  The patient has been asked to contact me if her symptoms persist or progress. Otherwise, she may return for follow up in 4 months.  Allergy with anaphylaxis due to food  Continue careful avoidance of shellfish and have access to epinephrine autoinjector 2 pack in case of accidental ingestion.  Food allergy action plan is in place.   Return in about 4 months (around 01/26/2017), or if symptoms worsen or fail to improve.

## 2016-09-26 NOTE — Assessment & Plan Note (Addendum)
   Continue appropriate allergen avoidance measures and treatment plan as outlined above for chronic sinusitis. The risks and benefits of aeroallergen immunotherapy have been discussed. The patient is motivated to initiate immunotherapy if insurance coverage is favorable. She will let us know how she would like to proceed.

## 2016-09-26 NOTE — Assessment & Plan Note (Signed)
>>  ASSESSMENT AND PLAN FOR SINUSITIS, CHRONIC WRITTEN ON 09/26/2016  6:18 PM BY BOBBITT, RALPH CARTER, MD  Increase nasal steroid rinses to twice a day rather than once daily. Start RyVent 6 mg every 6-8 hours as needed. If symptoms persist or progress over the next few days despite compliance with this treatment plan, she will start prednisone, 20 mg x 4 days, 10 mg x1 day, then stop. A referral will be made to Dr. Suszanne Conners, otolaryngology, for further evaluation.

## 2016-09-26 NOTE — Assessment & Plan Note (Addendum)
   Increase nasal steroid rinses to twice a day rather than once daily.  Start RyVent 6 mg every 6-8 hours as needed.  If symptoms persist or progress over the next few days despite compliance with this treatment plan, she will start prednisone, 20 mg x 4 days, 10 mg x1 day, then stop.  A referral will be made to Dr. Benjamine Mola, otolaryngology, for further evaluation.

## 2016-09-26 NOTE — Assessment & Plan Note (Signed)
   Continue careful avoidance of shellfish and have access to epinephrine autoinjector 2 pack in case of accidental ingestion.  Food allergy action plan is in place. 

## 2016-09-26 NOTE — Assessment & Plan Note (Signed)
   Treatment plan as outlined above.  Continue avoidance of oxymetazoline (Afrin).

## 2016-09-26 NOTE — Assessment & Plan Note (Signed)
Currently with suboptimal control.  A prescription has been provided for Flovent 110 g, 2 inhalations twice a day. To maximize pulmonary deposition, a spacer has been provided along with instructions for its proper administration with an HFA inhaler.  For now, continue montelukast 10 mg daily bedtime and albuterol HFA, 1-2 inhalations every 4-6 hours as needed.  The patient has been asked to contact me if her symptoms persist or progress. Otherwise, she may return for follow up in 4 months.

## 2016-09-26 NOTE — Telephone Encounter (Signed)
I called patient to inform her that [employer] at been trying to contact her about her delivery for Auvi-q. I gave her the number to contact them. [phone]. Patient informs me that she has been having trouble with her breathing over the weekend. She has a cough,chest tighten. Patient is coming in this morning to see Dr. Verlin Fester at 10:15. Patient also asked for a refill for her albuterol. I sent in refill.

## 2016-09-27 ENCOUNTER — Telehealth: Payer: Self-pay | Admitting: *Deleted

## 2016-09-27 ENCOUNTER — Other Ambulatory Visit: Payer: Self-pay

## 2016-09-27 MED ORDER — FLUTICASONE PROPIONATE HFA 110 MCG/ACT IN AERO: 2.0000 | INHALATION_SPRAY | Freq: Two times a day (BID) | RESPIRATORY_TRACT | 5 refills | Status: DC

## 2016-09-27 NOTE — Telephone Encounter (Signed)
Called and spoke with the patient and she informed me that someone has already called her about this.

## 2016-09-27 NOTE — Telephone Encounter (Signed)
-----   Message from Adelina Mings, MD sent at 09/26/2016  6:23 PM EDT ----- Please call the patient to inform her that I have decided to start her on Flovent 110 g, 2 inhalations twice a day.  We are also can be sending her a spacer device, please give her instructions on appropriate use of a spacer device.  Thanks.

## 2016-10-03 ENCOUNTER — Telehealth: Payer: Self-pay

## 2016-10-03 NOTE — Telephone Encounter (Signed)
Noted  

## 2016-10-03 NOTE — Telephone Encounter (Signed)
Spoke to patient informed her that she has an appt with Rometta Emery 10/18/2016 @ 9:30. Patient said that she was happy with that and that she would call to R/S with Dr. Benjamine Mola per her schedule appt will still be in Sept 2018

## 2016-10-03 NOTE — Telephone Encounter (Signed)
-----   Message from Adelina Mings, MD sent at 09/26/2016  6:22 PM EDT ----- Please make referral to Dr. Benjamine Mola, otolaryngology, for evaluation of chronic sinusitis. Thanks.

## 2016-10-03 NOTE — Telephone Encounter (Signed)
Marvell Fuller here is a ENT Referral .  Thanks

## 2016-11-13 ENCOUNTER — Encounter (HOSPITAL_COMMUNITY): Payer: Self-pay | Admitting: Emergency Medicine

## 2016-11-13 ENCOUNTER — Ambulatory Visit (HOSPITAL_COMMUNITY)
Admission: EM | Admit: 2016-11-13 | Discharge: 2016-11-13 | Disposition: A | Discharge status: Home / Self Care | Payer: 59 | Attending: Urgent Care | Admitting: Urgent Care

## 2016-11-13 DIAGNOSIS — Z9109 Other allergy status, other than to drugs and biological substances: Secondary | ICD-10-CM

## 2016-11-13 DIAGNOSIS — J069 Acute upper respiratory infection, unspecified: Secondary | ICD-10-CM | POA: Diagnosis not present

## 2016-11-13 DIAGNOSIS — J4540 Moderate persistent asthma, uncomplicated: Secondary | ICD-10-CM

## 2016-11-13 DIAGNOSIS — J4541 Moderate persistent asthma with (acute) exacerbation: Secondary | ICD-10-CM | POA: Diagnosis not present

## 2016-11-13 DIAGNOSIS — R05 Cough: Secondary | ICD-10-CM | POA: Diagnosis not present

## 2016-11-13 DIAGNOSIS — B9789 Other viral agents as the cause of diseases classified elsewhere: Secondary | ICD-10-CM

## 2016-11-13 MED ORDER — METHYLPREDNISOLONE ACETATE 40 MG/ML IJ SUSP
INTRAMUSCULAR | Status: AC
  Filled 2016-11-13: qty 1

## 2016-11-13 MED ORDER — BENZONATATE 100 MG PO CAPS: 100.0000 mg | ORAL_CAPSULE | Freq: Three times a day (TID) | ORAL | 0 refills | Status: DC | PRN

## 2016-11-13 MED ORDER — METHYLPREDNISOLONE ACETATE 80 MG/ML IJ SUSP
40.0000 mg | Freq: Once | INTRAMUSCULAR | Status: AC
  Administered 2016-11-13: 40 mg via INTRAMUSCULAR

## 2016-11-13 NOTE — ED Provider Notes (Signed)
MRN: [id number] DOB: [birth year]  Subjective:   Dominique Brock is a 61 y.o. female presenting for chief complaint of URI  Reports 3 day history of worsening dry cough, shob, chest soreness, sinus congestion, sinus pressure, bilateral ear pain and pressure, scratchy throat, neck soreness (L>R). Has tried albuterol inhaler this morning, take Sinugair and Ryvent nightly as prescribed by her allergist. Has a history of asthma but does not use her inhaler consistently, states that she was advised to only use it as a rescue inhaler. Denies smoking cigarettes.   No current facility-administered medications for this encounter.   Current Outpatient Prescriptions:  .  albuterol (VENTOLIN HFA) 108 (90 Base) MCG/ACT inhaler, Ventolin HFA 90 mcg/actuation aerosol inhaler  Inhale 2 puffs every 4 hours by inhalation route as needed., Disp: 1 Inhaler, Rfl: 1 .  atenolol (TENORMIN) 25 MG tablet, Take 1 tablet (25 mg total) by mouth daily., Disp: 90 tablet, Rfl: 3 .  budesonide (PULMICORT) 0.5 MG/2ML nebulizer solution, Take 2 mLs (0.5 mg total) by nebulization 2 (two) times daily., Disp: 120 mL, Rfl: 3 .  Carbinoxamine Maleate (RYVENT) 6 MG TABS, Take 6 mg by mouth every 6 (six) hours as needed., Disp: 120 tablet, Rfl: 5 .  estradiol (VIVELLE-DOT) 0.1 MG/24HR, Place 1 patch onto the skin. Wear patch x7 days, replace with a new one., Disp: , Rfl:  .  fluticasone (FLOVENT HFA) 110 MCG/ACT inhaler, Inhale 2 puffs into the lungs 2 (two) times daily., Disp: 1 Inhaler, Rfl: 5 .  fluticasone (FLOVENT HFA) 110 MCG/ACT inhaler, Inhale 2 puffs into the lungs 2 (two) times daily., Disp: 1 Inhaler, Rfl: 5 .  montelukast (SINGULAIR) 10 MG tablet, Take 1 tablet (10 mg total) by mouth at bedtime., Disp: 30 tablet, Rfl: 5 .  pseudoephedrine (SUDAFED) 120 MG 12 hr tablet, Take 120 mg by mouth 2 (two) times daily., Disp: , Rfl:  .  EPINEPHrine (AUVI-Q) 0.3 mg/0.3 mL IJ SOAJ injection, Inject 0.3 mLs (0.3 mg total) into the muscle  once., Disp: 1 Device, Rfl: 2   Dominique Brock is allergic to azithromycin; codeine; penicillins; and food.  Dominique Brock  has a past medical history of Asthma; Diabetes mellitus; Hypertension; and Sleep apnea. Also  has a past surgical history that includes Abdominal hysterectomy; Sinus exploration (1996); and Bilateral oophorectomy.  Objective:   Vitals: BP 135/61 (BP Location: Left Arm)   Pulse 62   Temp 97.9 F (36.6 C) (Oral)   Resp 20   SpO2 100%   Physical Exam  Constitutional: She is oriented to person, place, and time. She appears well-developed and well-nourished.  HENT:  TM's intact bilaterally, no effusions or erythema. Nasal turbinates pink and moist, nasal passages patent. No sinus tenderness. Oropharynx clear, mucous membranes moist.  Eyes: Right eye exhibits no discharge. Left eye exhibits no discharge.  Neck: Normal range of motion. Neck supple.  Cardiovascular: Normal rate, regular rhythm and intact distal pulses.  Exam reveals no gallop and no friction rub.   No murmur heard. Pulmonary/Chest: No respiratory distress. She has no wheezes. She has no rales.  Lymphadenopathy:    She has no cervical adenopathy.  Neurological: She is alert and oriented to person, place, and time.  Skin: Skin is warm and dry.  Psychiatric:  Patient was tearful due to frustration from her symptoms, had flat affect.   Assessment and Plan :   Viral URI with cough  Moderate persistent asthma without complication  Environmental allergies  Patient's physical exam findings reassuring. I counseled  patient extensively on use of steroids but she insisted that is what makes her feel better. She is frustrated by ongoing issues with allergies, cough, chest tightness. She is working with an Horticulturist, commercial but is not agreeable to seeing a pulmonologist or an ENT doctor. She was not agreeable with my treatment plan but I agreed to give her a dose of Depo-Medrol. Discussed the side effects of steroid use at length.  Patient verbalized understanding and insisted on the injection. I recommended she try to follow the treatment plan outlined in the patient instructions. Return-to-clinic precautions discussed, patient verbalized understanding. Otherwise, patient needs to follow up with her PCP.  Jaynee Eagles, PA-C [facility]  11/13/2016  3:01 PM    Jaynee Eagles, PA-C 11/13/16 [id number]

## 2016-11-13 NOTE — Discharge Instructions (Signed)
I recommend you use Zyrtec, Allegra or Claritin once daily with pseudoephedrine (120mg ) twice daily as needed for nasal congestion, sinus pressure, ear pressure. Take Singulair as well once daily. Make sure you hydrate well with at least 2 liters of water daily. Use Tessalon capsules together with your albuterol inhaler to help suppress coughing and bronchospasms. You should use your albuterol inhaler once every 4-6 hours to help stay ahead of coughing fits and bronchospasms.

## 2016-11-13 NOTE — ED Triage Notes (Signed)
Pt here for cold/allegy sx onset 3 days associated w/dry cough, chest soreness, bilateral ear fullness, facial pressure, SOB  Denies fevers  Was seen here in 08/18 for similar sx  A&O x4... NAD... Ambulatory

## 2016-11-14 ENCOUNTER — Other Ambulatory Visit: Payer: Self-pay

## 2016-11-14 ENCOUNTER — Telehealth: Payer: Self-pay | Admitting: Allergy and Immunology

## 2016-11-14 MED ORDER — ALBUTEROL SULFATE HFA 108 (90 BASE) MCG/ACT IN AERS: INHALATION_SPRAY | RESPIRATORY_TRACT | 1 refills | Status: DC

## 2016-11-14 NOTE — Telephone Encounter (Signed)
Called and spoke with patient. She has been doing her inhalers wrong. I informed her that she should use her Flovent 110 two inhalations twice a day with a spacer and use her albuterol as needed. She informed me she didn't have a space. I have placed one up front for her to pick up.

## 2016-11-14 NOTE — Telephone Encounter (Signed)
Pt called and said that she went to urgent care yesterday and they did not give her a breathing treatment and she is short of breat and wheezing and wants to talk with a nurse . [phone].

## 2016-12-06 ENCOUNTER — Ambulatory Visit (INDEPENDENT_AMBULATORY_CARE_PROVIDER_SITE_OTHER): Payer: 59 | Admitting: Internal Medicine

## 2016-12-06 VITALS — BP 128/82 | HR 74 | Wt 153.0 lb

## 2016-12-06 DIAGNOSIS — E039 Hypothyroidism, unspecified: Secondary | ICD-10-CM | POA: Diagnosis not present

## 2016-12-06 DIAGNOSIS — R7303 Prediabetes: Secondary | ICD-10-CM

## 2016-12-06 LAB — COMPLETE METABOLIC PANEL WITH GFR
AG RATIO: 1.7 (calc) (ref 1.0–2.5)
ALKALINE PHOSPHATASE (APISO): 50 U/L (ref 33–130)
ALT: 16 U/L (ref 6–29)
AST: 14 U/L (ref 10–35)
Albumin: 3.8 g/dL (ref 3.6–5.1)
BILIRUBIN TOTAL: 0.3 mg/dL (ref 0.2–1.2)
BUN: 20 mg/dL (ref 7–25)
CO2: 25 mmol/L (ref 20–32)
Calcium: 8.8 mg/dL (ref 8.6–10.4)
Chloride: 106 mmol/L (ref 98–110)
Creat: 0.76 mg/dL (ref 0.50–1.05)
GFR, EST AFRICAN AMERICAN: 100 mL/min/{1.73_m2} (ref >=60)
GFR, Est Non African American: 86 mL/min/{1.73_m2} (ref >=60)
Globulin: 2.2 g/dL (calc) (ref 1.9–3.7)
Glucose, Bld: 170 mg/dL — ABNORMAL HIGH (ref 65–99)
POTASSIUM: 4.2 mmol/L (ref 3.5–5.3)
Sodium: 139 mmol/L (ref 135–146)
TOTAL PROTEIN: 6 g/dL — AB (ref 6.1–8.1)

## 2016-12-06 LAB — LIPID PANEL
Cholesterol: 176 mg/dL (ref 0–200)
HDL: 47.4 mg/dL (ref >39.00)
LDL CALC: 105 mg/dL — AB (ref 0–99)
NONHDL: 128.1
Total CHOL/HDL Ratio: 4
Triglycerides: 116 mg/dL (ref 0.0–149.0)
VLDL: 23.2 mg/dL (ref 0.0–40.0)

## 2016-12-06 LAB — MICROALBUMIN / CREATININE URINE RATIO
Creatinine,U: 172.4 mg/dL
Microalb Creat Ratio: 1.6 mg/g (ref 0.0–30.0)
Microalb, Ur: 2.7 mg/dL — ABNORMAL HIGH (ref 0.0–1.9)

## 2016-12-06 LAB — T4, FREE: FREE T4: 0.68 ng/dL (ref 0.60–1.60)

## 2016-12-06 LAB — POCT GLYCOSYLATED HEMOGLOBIN (HGB A1C): Hemoglobin A1C: 5.6

## 2016-12-06 LAB — TSH: TSH: 3.02 u[IU]/mL (ref 0.35–4.50)

## 2016-12-06 NOTE — Progress Notes (Signed)
Patient ID: Dominique Brock, female   DOB: 04/09/1955, 61 y.o.   MRN: [id number]    HPI  Dominique Brock is a 61 y.o.-year-old female, returning for f/u for mild hypothyroidism and prediabetes. She was previously seen by Dr. Balan. Last visit with me 6 months ago.  Her asthma was worse >> had Prednisone tapers. Also on Flovent, Albuterol.   Pt. has been dx with hypothyroidism in 02/2013 (TSH 3.85) >> on Levothyroxine low dose. She feels better on this.  Pt is on levothyroxine 25 mcg daily, taken: - in am - fasting - at least 30 min from b'fast - no Ca, Fe, MVI, PPIs - not on Biotin  I reviewed pt's thyroid tests: Lab Results  Component Value Date   TSH 3.40 03/11/2016   FREET4 0.47 (L) 03/11/2016   T3FREE 2.5 03/11/2016   Component     Latest Ref Rng & Units 03/11/2016  Thyroperoxidase Ab SerPl-aCnc     <9 IU/mL <1  Thyroglobulin Ab     <2 IU/mL <1   03/13/2015: TSH 1.23  03/06/2014: TSH 1.98 07/19/2013: TSH 1.95 05/02/2013: TSH 1.86 03/05/2013: TSH 3.85  Pt describes: - + weight gain - + fatigue - + cold and heat intolerance - no no depression - no constipation - no dry skin - no hair loss  Pt denies: - feeling nodules in neck - choking - SOB with lying down  But has: - hoarseness - dysphagia Presumably 2/2 allergies/asthma.  She has no FH of thyroid disorders.No FH of thyroid cancer. No h/o radiation tx to head or neck.  No seaweed or kelp. No recent contrast studies. No herbal supplements. No Biotin use. No recent steroids use.   Pt. also has Prediabetes.  Last hemoglobin A1c was: Lab Results  Component Value Date   HGBA1C 6.1 06/06/2016  11/2015: HbA1c 5.3% 03/13/2015: HbA1c 6.1% (after Prednisone) 03/06/2014: HbA1c 5.9% 03/05/2013: HbA1c 5.4%  She is controlling her prediabetes with diet.  She is not check her sugars at home.  Pt's meals are: - Breakfast: OJ, protein bar or egg and bacon - snack: coffee, yoghurt - Lunch: fast food  (chicken nuggets, no fries; occasional soft drinks) - Dinner: can of soup or meat + veggies, some bread - Snacks: no  - No CKD, last BUN/creatinine:  03/13/2015: 22/0.77, eGFR 101 Lab Results  Component Value Date   BUN 12 06/29/2007   CREATININE 0.8 06/29/2007   - last set of lipids: 03/13/2015: 180/84/40/123 No results found for: CHOL, HDL, LDLCALC, LDLDIRECT, TRIG, CHOLHDL - last eye exam was in 2017 >> No DR - Denies numbness and tingling in her feet.  Pt has FH of DM in mother, MGM, other family members.  ROS: Constitutional: no weight gain/no weight loss, + fatigue, + subjective hyperthermia, no subjective hypothermia Eyes: + blurry vision, no xerophthalmia ENT: + sore throat, + see HPI Cardiovascular: no CP/no SOB/no palpitations/no leg swelling Respiratory: no cough/no SOB/no wheezing Gastrointestinal: no N/no V/no D/+ C/no acid reflux Musculoskeletal: no muscle aches/no joint aches Skin: no rashes, no hair loss Neurological: no tremors/no numbness/no tingling/no dizziness  I reviewed pt's medications, allergies, PMH, social hx, family hx, and changes were documented in the history of present illness. Otherwise, unchanged from my initial visit note.  Past Medical History:  Diagnosis Date  . Asthma   . Diabetes mellitus   . Hypertension   . Sleep apnea    Past Surgical History:  Procedure Laterality Date  . ABDOMINAL HYSTERECTOMY    .   BILATERAL OOPHORECTOMY    . SINUS EXPLORATION  1996   Social History   Social History  . Marital status: Married    Spouse name: N/A  . Number of children: 2   Occupational History  . Buyer   Social History Main Topics  . Smoking status: Never Smoker  . Smokeless tobacco: Never Used  . Alcohol use Yes     Comment: wine, once a week  . Drug use: No   Current Outpatient Prescriptions on File Prior to Visit  Medication Sig Dispense Refill  . albuterol (VENTOLIN HFA) 108 (90 Base) MCG/ACT inhaler Ventolin HFA 90  mcg/actuation aerosol inhaler  Inhale 2 puffs every 4 hours by inhalation route as needed. 1 Inhaler 1  . atenolol (TENORMIN) 25 MG tablet Take 1 tablet (25 mg total) by mouth daily. 90 tablet 3  . benzonatate (TESSALON) 100 MG capsule Take 1-2 capsules (100-200 mg total) by mouth 3 (three) times daily as needed for cough. 60 capsule 0  . budesonide (PULMICORT) 0.5 MG/2ML nebulizer solution Take 2 mLs (0.5 mg total) by nebulization 2 (two) times daily. 120 mL 3  . Carbinoxamine Maleate (RYVENT) 6 MG TABS Take 6 mg by mouth every 6 (six) hours as needed. 120 tablet 5  . estradiol (VIVELLE-DOT) 0.1 MG/24HR Place 1 patch onto the skin. Wear patch x7 days, replace with a new one.    . fluticasone (FLOVENT HFA) 110 MCG/ACT inhaler Inhale 2 puffs into the lungs 2 (two) times daily. 1 Inhaler 5  . montelukast (SINGULAIR) 10 MG tablet Take 1 tablet (10 mg total) by mouth at bedtime. 30 tablet 5  . pseudoephedrine (SUDAFED) 120 MG 12 hr tablet Take 120 mg by mouth 2 (two) times daily.    Marland Kitchen EPINEPHrine (AUVI-Q) 0.3 mg/0.3 mL IJ SOAJ injection Inject 0.3 mLs (0.3 mg total) into the muscle once. 1 Device 2  . [DISCONTINUED] hydrochlorothiazide 25 MG tablet Take 25 mg by mouth daily.      . [DISCONTINUED] imipramine (TOFRANIL) 25 MG tablet Take 1 tablet (25 mg total) by mouth at bedtime. 15 tablet 0  . [DISCONTINUED] progesterone (PROMETRIUM) 100 MG capsule Take 100 mg by mouth daily.       No current facility-administered medications on file prior to visit.    Allergies  Allergen Reactions  . Azithromycin Rash    REACTION: rash  . Codeine Nausea And Vomiting  . Penicillins Rash  . Food     shellfish   Family History  Problem Relation Age of Onset  . Hypertension Mother   . Hyperlipidemia Mother   . Liver cancer Mother   . Allergies Mother   . Diabetes Mother   . Healthy Father   . Healthy Unknown        2 siblings  . Asthma Maternal Grandmother   . Asthma Cousin   . Allergic rhinitis Neg Hx    . Angioedema Neg Hx   . Eczema Neg Hx   . Immunodeficiency Neg Hx   . Urticaria Neg Hx    PE: BP 128/82 (BP Location: Left Arm, Patient Position: Sitting)   Pulse 74   Wt 153 lb (69.4 kg)   SpO2 98%   BMI 26.26 kg/m  Wt Readings from Last 3 Encounters:  12/06/16 153 lb (69.4 kg)  09/19/16 151 lb (68.5 kg)  06/06/16 153 lb (69.4 kg)   Constitutional: overweight, in NAD Eyes: PERRLA, EOMI, no exophthalmos ENT: moist mucous membranes, no thyromegaly, no cervical lymphadenopathy Cardiovascular: RRR,  No MRG Respiratory: CTA B Gastrointestinal: abdomen soft, NT, ND, BS+ Musculoskeletal: no deformities, strength intact in all 4 Skin: moist, warm, no rashes Neurological: no tremor with outstretched hands, DTR normal in all 4  ASSESSMENT: 1. Hypothyroidism  2. Prediabetes  PLAN:  1. Patient with history of mild hypothyroidism (highest TSH available for review was 3.8). She was started on levothyroxine, low dose, 25 g daily, then she stopped this. She developed fatigue, weight gain, heat and cold intolerance, and restarted the low dose levothyroxine 2 weeks before our last visit. We did continue the medication at that time, but again she stopped since last visit. - will recheck her TFTs today and see if we need to restart the med - discussed about situations when we would need to restart. - we discussed about taking the thyroid hormone correctly, in case we need to restart: every day, with water, >30 minutes before breakfast, separated by >4 hours from acid reflux medications, calcium, iron, multivitamins. Pt. is taking it correctly - RTC in 6 mo  2. Patient with history of diabetes/prediabetes, diet-controlled. I reviewed available records, and her highest HbA1c was 6.1% after a course of steroids at the beginning of 2017. Before and after this date, patients HbA1c has stayed lower than 6%.  - at last visit, HbA1c was 6.1% >> today 5.6% (better!) - UTD with eye exams - at last  visit I ordered annual labs but she did not return to have them checked. Will check them today: Orders Placed This Encounter  Procedures  . T4, free  . TSH  . COMPLETE METABOLIC PANEL WITH GFR  . Microalbumin / creatinine urine ratio  . Lipid panel  - Return to clinic in 6 mo with sugar log   Component     Latest Ref Rng & Units 12/06/2016  Glucose     65 - 99 mg/dL 170 (H)  BUN     7 - 25 mg/dL 20  Creatinine     0.50 - 1.05 mg/dL 0.76  GFR, Est Non African American     > OR = 60 mL/min/1.73m2 86  GFR, Est African American     > OR = 60 mL/min/1.73m2 100  BUN/Creatinine Ratio     6 - 22 (calc) NOT APPLICABLE  Sodium     135 - 146 mmol/L 139  Potassium     3.5 - 5.3 mmol/L 4.2  Chloride     98 - 110 mmol/L 106  CO2     20 - 32 mmol/L 25  Calcium     8.6 - 10.4 mg/dL 8.8  Total Protein     6.1 - 8.1 g/dL 6.0 (L)  Albumin MSPROF     3.6 - 5.1 g/dL 3.8  Globulin     1.9 - 3.7 g/dL (calc) 2.2  AG Ratio     1.0 - 2.5 (calc) 1.7  Total Bilirubin     0.2 - 1.2 mg/dL 0.3  Alkaline phosphatase (APISO)     33 - 130 U/L 50  AST     10 - 35 U/L 14  ALT     6 - 29 U/L 16  Cholesterol     0 - 200 mg/dL 176  Triglycerides     0.0 - 149.0 mg/dL 116.0  HDL Cholesterol     >39.00 mg/dL 47.40  VLDL     0.0 - 40.0 mg/dL 23.2  LDL (calc)     0 - 99 mg/dL 105 (H)  Total CHOL/HDL   Ratio      4  NonHDL      128.10  Microalb, Ur     0.0 - 1.9 mg/dL 2.7 (H)  Creatinine,U     mg/dL 172.4  MICROALB/CREAT RATIO     0.0 - 30.0 mg/g 1.6  T4,Free(Direct)     0.60 - 1.60 ng/dL 0.68  TSH     0.35 - 4.50 uIU/mL 3.02  Hemoglobin A1C      5.6   HbA1c better. Glu high. TFTs normal. Lipids OK.  Cristina Gherghe, MD PhD [facility]   

## 2016-12-06 NOTE — Patient Instructions (Addendum)
Please stop at the lab.  If we need to restart thyroid hormone, then take every day, with water, at least 30 minutes before breakfast, separated by at least 4 hours from: - acid reflux medications - calcium - iron - multivitamins  Please come back for a follow-up appointment in 6 months.

## 2016-12-07 ENCOUNTER — Encounter: Payer: Self-pay | Admitting: Internal Medicine

## 2016-12-07 ENCOUNTER — Telehealth: Payer: Self-pay

## 2016-12-07 NOTE — Telephone Encounter (Signed)
-----   Message from [name], MD sent at 12/07/2016  9:22 AM EDT ----- Almyra Free, can you please call pt:  HbA1c better, at 5.6%. Glu high, at 170. TFTs normal. Lipids OK.

## 2016-12-07 NOTE — Telephone Encounter (Signed)
LVM, gave lab results. Gave call back number if any questions or concerns.  

## 2016-12-13 ENCOUNTER — Other Ambulatory Visit: Payer: Self-pay | Admitting: Gastroenterology

## 2016-12-13 DIAGNOSIS — R1012 Left upper quadrant pain: Secondary | ICD-10-CM

## 2016-12-13 DIAGNOSIS — R1011 Right upper quadrant pain: Secondary | ICD-10-CM

## 2016-12-14 ENCOUNTER — Ambulatory Visit
Admission: RE | Admit: 2016-12-14 | Discharge: 2016-12-14 | Disposition: A | Discharge status: Home / Self Care | Payer: 59 | Source: Ambulatory Visit | Attending: Gastroenterology | Admitting: Gastroenterology

## 2016-12-14 DIAGNOSIS — R1012 Left upper quadrant pain: Secondary | ICD-10-CM

## 2016-12-14 DIAGNOSIS — R1011 Right upper quadrant pain: Secondary | ICD-10-CM

## 2016-12-20 ENCOUNTER — Ambulatory Visit: Payer: PRIVATE HEALTH INSURANCE | Admitting: Allergy and Immunology

## 2017-01-09 ENCOUNTER — Ambulatory Visit: Payer: 59 | Admitting: Allergy and Immunology

## 2017-01-11 ENCOUNTER — Ambulatory Visit: Payer: 59 | Admitting: Allergy and Immunology

## 2017-01-24 ENCOUNTER — Telehealth: Payer: Self-pay | Admitting: Allergy and Immunology

## 2017-01-24 ENCOUNTER — Other Ambulatory Visit: Payer: Self-pay

## 2017-01-24 MED ORDER — PREDNISONE 10 MG PO TABS: 10.0000 mg | ORAL_TABLET | Freq: Every day | ORAL | 0 refills | Status: DC

## 2017-01-24 MED ORDER — DOXYCYCLINE HYCLATE 50 MG PO CAPS: 100.0000 mg | ORAL_CAPSULE | Freq: Two times a day (BID) | ORAL | 0 refills | Status: AC

## 2017-01-24 NOTE — Telephone Encounter (Signed)
Doxycycline 100 mg bid x 7 days. Also send in prednisone, 20 mg x 4 days, 10 mg x1 day, then stop. Use nasal saline lavage a few times per day with sterile saline. Thanks.

## 2017-01-24 NOTE — Telephone Encounter (Signed)
I have sent that in and called patient to inform her. Patient stated that she was recently diagnosed with a hiatal hernia and was wondering if this would be too much on her stomach. Please advise.

## 2017-01-24 NOTE — Telephone Encounter (Signed)
Patient called and stated that she was wanting to know if Dr. Verlin Fester would send in something for her. She has had a cough for a week and has been coughing up yellow mucus. She stated she also has had chills, pain in ear and face, and sick to her stomach. She said she has been taking tylenol and sudafed PE for a week and it isn't helping. Please Advise.

## 2017-01-24 NOTE — Telephone Encounter (Signed)
Patient has been informed and advised of this information. She states that she will just make sure to have plenty on her stomach when taking medications. She will cal GI with any further questions in regards to the hernia.

## 2017-01-24 NOTE — Telephone Encounter (Signed)
She will have to ask that question to the gastroenterologist who diagnosed the hiatal hernia.

## 2017-01-31 ENCOUNTER — Other Ambulatory Visit: Payer: Self-pay

## 2017-01-31 ENCOUNTER — Telehealth: Payer: Self-pay | Admitting: Allergy and Immunology

## 2017-01-31 MED ORDER — LEVOFLOXACIN 500 MG PO TABS: 500.0000 mg | ORAL_TABLET | Freq: Every day | ORAL | 0 refills | Status: DC

## 2017-01-31 NOTE — Telephone Encounter (Signed)
Dr. Verlin Fester sent in doxycycline and prednisone on 01-24-17 for Dominique Brock. She said she is still congested and coughing really bad. She would like to know what to do, or if there is anything more that can be called in.

## 2017-01-31 NOTE — Telephone Encounter (Signed)
Let's change to an antibiotic with slightly improved coverage: Levaquin 500mg  daily for seven days. Please send in prescription. Also encouraged Mucinex 1200mg  BID and nasal saline lavage.   Thanks, Salvatore Marvel, MD [facility]

## 2017-01-31 NOTE — Telephone Encounter (Signed)
Sent in rx and informed pt 

## 2017-01-31 NOTE — Telephone Encounter (Signed)
Dr. Ernst Bowler can you please advise on this.

## 2017-02-01 ENCOUNTER — Emergency Department (HOSPITAL_COMMUNITY): Payer: 59

## 2017-02-01 ENCOUNTER — Emergency Department (HOSPITAL_COMMUNITY)
Admission: EM | Admit: 2017-02-01 | Discharge: 2017-02-02 | Disposition: A | Discharge status: Home / Self Care | Payer: 59 | Attending: Emergency Medicine | Admitting: Emergency Medicine

## 2017-02-01 ENCOUNTER — Encounter (HOSPITAL_COMMUNITY): Payer: Self-pay | Admitting: Emergency Medicine

## 2017-02-01 ENCOUNTER — Other Ambulatory Visit: Payer: Self-pay

## 2017-02-01 DIAGNOSIS — Z79899 Other long term (current) drug therapy: Secondary | ICD-10-CM | POA: Diagnosis not present

## 2017-02-01 DIAGNOSIS — J069 Acute upper respiratory infection, unspecified: Secondary | ICD-10-CM | POA: Insufficient documentation

## 2017-02-01 DIAGNOSIS — Z880 Allergy status to penicillin: Secondary | ICD-10-CM | POA: Insufficient documentation

## 2017-02-01 DIAGNOSIS — J4541 Moderate persistent asthma with (acute) exacerbation: Secondary | ICD-10-CM

## 2017-02-01 DIAGNOSIS — E119 Type 2 diabetes mellitus without complications: Secondary | ICD-10-CM | POA: Diagnosis not present

## 2017-02-01 DIAGNOSIS — R05 Cough: Secondary | ICD-10-CM | POA: Diagnosis not present

## 2017-02-01 DIAGNOSIS — I10 Essential (primary) hypertension: Secondary | ICD-10-CM | POA: Diagnosis not present

## 2017-02-01 DIAGNOSIS — E039 Hypothyroidism, unspecified: Secondary | ICD-10-CM | POA: Diagnosis not present

## 2017-02-01 MED ORDER — DEXAMETHASONE SODIUM PHOSPHATE 10 MG/ML IJ SOLN
10.0000 mg | Freq: Once | INTRAMUSCULAR | Status: AC
  Administered 2017-02-01: 10 mg via INTRAMUSCULAR
  Filled 2017-02-01: qty 1

## 2017-02-01 MED ORDER — METHYLPREDNISOLONE SODIUM SUCC 40 MG IJ SOLR
40.0000 mg | Freq: Once | INTRAMUSCULAR | Status: AC
  Administered 2017-02-01: 40 mg via INTRAMUSCULAR
  Filled 2017-02-01: qty 1

## 2017-02-01 MED ORDER — BENZONATATE 100 MG PO CAPS: 200.0000 mg | ORAL_CAPSULE | Freq: Two times a day (BID) | ORAL | 0 refills | Status: DC | PRN

## 2017-02-01 MED ORDER — IPRATROPIUM-ALBUTEROL 0.5-2.5 (3) MG/3ML IN SOLN
3.0000 mL | Freq: Once | RESPIRATORY_TRACT | Status: DC
  Filled 2017-02-01: qty 3

## 2017-02-01 MED ORDER — AEROCHAMBER PLUS FLO-VU LARGE MISC
1.0000 | Freq: Once | Status: AC
  Administered 2017-02-01: 1

## 2017-02-01 MED ORDER — AEROCHAMBER PLUS FLO-VU LARGE MISC
Status: AC
  Filled 2017-02-01: qty 1

## 2017-02-01 MED ORDER — ALBUTEROL SULFATE HFA 108 (90 BASE) MCG/ACT IN AERS
2.0000 | INHALATION_SPRAY | Freq: Once | RESPIRATORY_TRACT | Status: AC
  Administered 2017-02-01: 2 via RESPIRATORY_TRACT
  Filled 2017-02-01: qty 6.7

## 2017-02-01 MED ORDER — ALBUTEROL SULFATE (2.5 MG/3ML) 0.083% IN NEBU
5.0000 mg | INHALATION_SOLUTION | Freq: Once | RESPIRATORY_TRACT | Status: AC
  Administered 2017-02-01: 5 mg via RESPIRATORY_TRACT
  Filled 2017-02-01: qty 6

## 2017-02-01 NOTE — ED Triage Notes (Signed)
Pt states she has been sick "for weeks" was recently on doxycycline -- stopped on Saturday due to diarrhea, had a course of prednisone-- finished. Started Levaquin yesterday. Started coughing last night at 1230am and has not been able to stop.  Does have a hx of asthma.

## 2017-02-01 NOTE — Progress Notes (Signed)
MDI spacer does not scan, but it was given w/ albuterol MDI per MD order. RN in room and aware.

## 2017-02-01 NOTE — Discharge Instructions (Signed)
Use your inhaler 2 puffs every 4 hours. Contact a health care provider if: You have muscle aches. You have chest pain. The mucus that you cough up (sputum) changes from clear or white to yellow, green, gray, or bloody. You have a fever. Your sputum gets thicker. Get help right away if: Your wheezing and coughing get worse, even after you take your prescribed medicines. It gets even harder to breathe. You develop severe chest pain.

## 2017-02-01 NOTE — ED Provider Notes (Signed)
[facility] EMERGENCY DEPARTMENT Provider Note   CSN: [id number] Arrival date & time: 02/01/17  6578     History   Chief Complaint Chief Complaint  Patient presents with  . Asthma  . URI    HPI Dominique Brock is a 61 y.o. female.  With a past medical history of asthma, allergies, borderline diabetes, hypertension.  [facility]  Patient has had about a month of persistent asthmatic bronchitis after initial URI.  She has been using her Flovent, she had a recent steroid burst, and she has been using over-the-counter cough suppressants.  She states that the steroid burst was very helpful during the time that she was on it however about 2 days after stopping all of her symptoms return.  Coughing to the point of breathlessness with wheezing.  She did not currently have a rescue inhaler.  Patient states that back in June she saw Dr. Joseph Art who gave HER 2 shots which "totally knocked it out."  She denies fevers or chills.  She is been treated with antibiotics without any improvement in her symptoms.  Her last hemoglobin A1c was 5.6 about 2 weeks ago.  HPI  Past Medical History:  Diagnosis Date  . Asthma   . Diabetes mellitus   . Hypertension   . Sleep apnea     Patient Active Problem List   Diagnosis Date Noted  . Perennial allergic rhinitis 09/19/2016  . Rhinitis medicamentosa 09/19/2016  . 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 Chronic sinusitis 09/19/2016  . Hypothyroidism 02/05/2016  . Prediabetes 02/05/2016  . EPISTAXIS, RECURRENT 03/24/2009  . BRONCHITIS, ACUTE 10/31/2008  . Sinusitis, chronic 02/29/2008  . ABDOMINAL PAIN, RIGHT UPPER QUADRANT 06/29/2007  . LIPOMA OF UNSPECIFIED SITE 06/12/2007  . SYNCOPE, VASOVAGAL 06/12/2007  . Essential hypertension 03/26/2007  . ESSENTIAL HYPERTENSION, BENIGN 02/17/2007  . Moderate persistent asthma 02/17/2007  . Obstructive sleep apnea, hx of 02/17/2007     Past Surgical History:  Procedure Laterality Date  . ABDOMINAL HYSTERECTOMY    . BILATERAL OOPHORECTOMY    . SINUS EXPLORATION  1996    OB History    No data available       Home Medications    Prior to Admission medications   Medication Sig Start Date End Date Taking? Authorizing Provider  albuterol (VENTOLIN HFA) 108 (90 Base) MCG/ACT inhaler Ventolin HFA 90 mcg/actuation aerosol inhaler  Inhale 2 puffs every 4 hours by inhalation route as needed. 11/14/16   Bobbitt, Sedalia Muta, MD  atenolol (TENORMIN) 25 MG tablet Take 1 tablet (25 mg total) by mouth daily. 06/06/16   [name], MD  benzonatate (TESSALON) 100 MG capsule Take 1-2 capsules (100-200 mg total) by mouth 3 (three) times daily as needed for cough. 11/13/16   Jaynee Eagles, PA-C  budesonide (PULMICORT) 0.5 MG/2ML nebulizer solution Take 2 mLs (0.5 mg total) by nebulization 2 (two) times daily. 09/26/16   Bobbitt, Sedalia Muta, MD  Carbinoxamine Maleate (RYVENT) 6 MG TABS Take 6 mg by mouth every 6 (six) hours as needed. 09/19/16   Bobbitt, Sedalia Muta, MD  EPINEPHrine (AUVI-Q) 0.3 mg/0.3 mL IJ SOAJ injection Inject 0.3 mLs (0.3 mg total) into the muscle once. 09/19/16 09/19/16  Bobbitt, Sedalia Muta, MD  estradiol (VIVELLE-DOT) 0.1 MG/24HR Place 1 patch onto the skin. Wear patch x7 days, replace with a new one.    [provider]  fluticasone (FLOVENT HFA) 110 MCG/ACT inhaler Inhale 2  puffs into the lungs 2 (two) times daily. 09/27/16   Bobbitt, Sedalia Muta, MD  levofloxacin (LEVAQUIN) 500 MG tablet Take 1 tablet (500 mg total) by mouth daily. 01/31/17   Valentina Shaggy, MD  montelukast (SINGULAIR) 10 MG tablet Take 1 tablet (10 mg total) by mouth at bedtime. 09/19/16   Bobbitt, Sedalia Muta, MD  predniSONE (DELTASONE) 10 MG tablet Take 1 tablet (10 mg total) by mouth daily with breakfast. Take 20 mg for 4 days then take 10 mg for 1 day then stop. 01/24/17   Bobbitt, Sedalia Muta, MD  pseudoephedrine  (SUDAFED) 120 MG 12 hr tablet Take 120 mg by mouth 2 (two) times daily.    [provider]    Family History Family History  Problem Relation Age of Onset  . Hypertension Mother   . Hyperlipidemia Mother   . Liver cancer Mother   . Allergies Mother   . Diabetes Mother   . Healthy Father   . Healthy Unknown        2 siblings  . Asthma Maternal Grandmother   . Asthma Cousin   . Allergic rhinitis Neg Hx   . Angioedema Neg Hx   . Eczema Neg Hx   . Immunodeficiency Neg Hx   . Urticaria Neg Hx     Social History Social History   Tobacco Use  . Smoking status: Never Smoker  . Smokeless tobacco: Never Used  Substance Use Topics  . Alcohol use: Yes    Comment: less than once a month  . Drug use: No     Allergies   Azithromycin; Codeine; Penicillins; and Food   Review of Systems Review of Systems Ten systems reviewed and are negative for acute change, except as noted in the HPI.    Physical Exam Updated Vital Signs BP 119/83 (BP Location: Right Arm)   Pulse 79   Temp 98.5 F (36.9 C) (Oral)   Resp 17   SpO2 100%   Physical Exam  Constitutional: She is oriented to person, place, and time. She appears well-developed and well-nourished. No distress.  HENT:  Head: Normocephalic and atraumatic.  Eyes: Conjunctivae are normal. No scleral icterus.  Neck: Normal range of motion.  Cardiovascular: Normal rate, regular rhythm and normal heart sounds. Exam reveals no gallop and no friction rub.  No murmur heard. Pulmonary/Chest: Effort normal. No respiratory distress. She has wheezes.  Tight, bronchitic persistent cough throughout visit.  Abdominal: Soft. Bowel sounds are normal. She exhibits no distension and no mass. There is no tenderness. There is no guarding.  Neurological: She is alert and oriented to person, place, and time.  Skin: Skin is warm and dry. She is not diaphoretic.  Psychiatric: Her behavior is normal.  Nursing note and vitals  reviewed.    ED Treatments / Results  Labs (all labs ordered are listed, but only abnormal results are displayed) Labs Reviewed - No data to display  EKG  EKG Interpretation None       Radiology Dg Chest 2 View  Result Date: 02/01/2017 CLINICAL DATA:  Short of breath EXAM: CHEST  2 VIEW COMPARISON:  08/05/2016 FINDINGS: The heart size and mediastinal contours are within normal limits. Both lungs are clear. The visualized skeletal structures are unremarkable. IMPRESSION: No active cardiopulmonary disease. Electronically Signed   By: Franchot Gallo M.D.   On: 02/01/2017 07:25    Procedures Procedures (including critical care time)  Medications Ordered in ED Medications  dexamethasone (DECADRON) injection 10 mg (not administered)  methylPREDNISolone sodium succinate (SOLU-MEDROL) 40 mg/mL injection 40 mg (not administered)  ipratropium-albuterol (DUONEB) 0.5-2.5 (3) MG/3ML nebulizer solution 3 mL (not administered)  albuterol (PROVENTIL HFA;VENTOLIN HFA) 108 (90 Base) MCG/ACT inhaler 2 puff (not administered)  AEROCHAMBER PLUS FLO-VU LARGE MISC 1 each (not administered)  albuterol (PROVENTIL) (2.5 MG/3ML) 0.083% nebulizer solution 5 mg (5 mg Nebulization Given 02/01/17 0653)     Initial Impression / Assessment and Plan / ED Course  I have reviewed the triage vital signs and the nursing notes.  Pertinent labs & imaging results that were available during my care of the patient were reviewed by me and considered in my medical decision making (see chart for details).     Patient sxs improved. Will dc hoe with tx for bronchospasm. F/U with allergist or PCP. Normal 02 sats, HDS/ Afebrile.  Final Clinical Impressions(s) / ED Diagnoses   Final diagnoses:  Moderate persistent asthmatic bronchitis with acute exacerbation    ED Discharge Orders    None       Margarita Mail, PA-C 02/01/17 1107    Charlesetta Shanks, MD 02/04/17 [phone]

## 2017-02-02 ENCOUNTER — Ambulatory Visit: Payer: 59 | Admitting: Allergy & Immunology

## 2017-02-02 ENCOUNTER — Encounter: Payer: Self-pay | Admitting: Allergy & Immunology

## 2017-02-02 VITALS — BP 122/72 | HR 72 | Temp 97.8°F | Resp 17

## 2017-02-02 DIAGNOSIS — J3089 Other allergic rhinitis: Secondary | ICD-10-CM

## 2017-02-02 DIAGNOSIS — J0100 Acute maxillary sinusitis, unspecified: Secondary | ICD-10-CM

## 2017-02-02 DIAGNOSIS — J4540 Moderate persistent asthma, uncomplicated: Secondary | ICD-10-CM

## 2017-02-02 MED ORDER — ALBUTEROL SULFATE (2.5 MG/3ML) 0.083% IN NEBU: 2.5000 mg | INHALATION_SOLUTION | RESPIRATORY_TRACT | 1 refills | Status: DC | PRN

## 2017-02-02 NOTE — Progress Notes (Signed)
FOLLOW UP  Date of Service/Encounter:  02/02/17   Assessment:   Moderate persistent asthma without complicated by the effects of a viral URI with subsequent secondary bacterial infection  Acute non-recurrent maxillary sinusitis  Perennial allergic rhinitis (molds, dust mites, dogs)  Plan/Recommendations:   1. Persistent asthma - Lung testing looks great today, and I anticipate that she is suffering from ciliary dysfunction from a viral infection, which is complicated by a secondary bacterial sinusitis.  - We did send Ms. Allard home with a nebulizer machine so she can use neb treatments at home. - She has received a multitude of prednisone doses over the past two weeks, and frankly I do not think that they are needed at this point (she is comfortable with this since she does not like how they make her feel). - I also offered an opioid containing cough medicine so that she could at least get some sleep, but she declined since she does not tolerate codeine.  - Daily controller medication(s): Use the sample of Symbicort two puffs twice daily provided until it is gone and then switch back to Flovent 152mcg 2 puffs twice daily with spacer - Prior to physical activity: ProAir 2 puffs 10-15 minutes before physical activity. - Rescue medications: ProAir 4 puffs every 4-6 hours as needed or albuterol nebulizer one vial puffs every 4-6 hours as needed - Asthma control goals:  * Full participation in all desired activities (may need albuterol before activity) * Albuterol use two time or less a week on average (not counting use with activity) * Cough interfering with sleep two time or less a month * Oral steroids no more than once a year * No hospitalizations  2. Acute sinusitis - Restart the Levaquin. - Continue with the nasal saline rinses with the budesonide.  3. Return in about 3 weeks (around 02/23/2017).  Subjective:   Dominique Brock is a 61 y.o. female presenting today for  follow up of coughing.   Dominique Brock has a history of the following: Patient Active Problem List   Diagnosis Date Noted  . Perennial allergic rhinitis 09/19/2016  . Rhinitis medicamentosa 09/19/2016  . 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 Chronic sinusitis 09/19/2016  . Hypothyroidism 02/05/2016  . Prediabetes 02/05/2016  . EPISTAXIS, RECURRENT 03/24/2009  . BRONCHITIS, ACUTE 10/31/2008  . Sinusitis, chronic 02/29/2008  . ABDOMINAL PAIN, RIGHT UPPER QUADRANT 06/29/2007  . LIPOMA OF UNSPECIFIED SITE 06/12/2007  . SYNCOPE, VASOVAGAL 06/12/2007  . Essential hypertension 03/26/2007  . ESSENTIAL HYPERTENSION, BENIGN 02/17/2007  . Moderate persistent asthma 02/17/2007  . Obstructive sleep apnea, hx of 02/17/2007    History obtained from: chart review and patient.  Dominique Brock Primary Care Provider is Patient, No Pcp Per.     Dominique Brock is a 61 y.o. female presenting for a sick visit. She was last seen in clinic in August 2018. She has a history of persistent asthma, allergic rhinitis, and food allergies (shellfish). At that visit, she was complaining of worsening asthma symptoms, nasal congestion, and sinus pressure. She was diagnosed with chronic sinusitis at that visit. Her nasal steroid was increased to twice daily budesonide rinses. She was started on Ryvent 6mg  every 6-8 hours as needed. She was also given a prednisone dose pack to start if needed and she was referred to Dr. Benjamine Mola. Allergy testing performed in August 2018 was positive to molds, dogs, and dust mite via intradermals. She was also started on Flovent 148mcg two puffs BID  and continued on montelukast 10mg  daily.   In the interim, she called just over one week ago complaining of sinus problems and was started on doxycyline as well as a prednisone taper. I received a phone note two days reporting continued sinus pressure and nasal discharge, therefore I expanded her antibiotic coverage with Levaquin 500mg   daily. She took her first dose on the evening of Tuesday December 18th around 9pm. Then she awoke in the middle of the night around four hours later with the an intense "asthma attack" with coughing. She went to [facility] ED and received a breathing treatment. CXR was clear and the breathing treatment helped "a lot". She was given both methylprednisolone and dexamethasone during the ED visit. She also received Tessalon pearls, which apparently have not helped much. She was told to stop her antibiotic.   In the interim, she has continued to have sinus pain and pressure with purulent nasal drainage. She points to her frontal sinuses and endorses intense pain in these areas. Her coughing has continued and she has been using albuterol two puffs every 6-8 hours at home with minimal improvement. She does not have a nebulizer at home. She reports completing "two bottles" of Mucinex at home with minimal improvement over the course of the last two weeks. She is desperate to get some sleep and improve the cough.  Otherwise, there have been no changes to her past medical history, surgical history, family history, or social history.    Review of Systems: a 14-point review of systems is pertinent for what is mentioned in HPI.  Otherwise, all other systems were negative. Constitutional: negative other than that listed in the HPI Eyes: negative other than that listed in the HPI Ears, nose, mouth, throat, and face: negative other than that listed in the HPI Respiratory: negative other than that listed in the HPI Cardiovascular: negative other than that listed in the HPI Gastrointestinal: negative other than that listed in the HPI Genitourinary: negative other than that listed in the HPI Integument: negative other than that listed in the HPI Hematologic: negative other than that listed in the HPI Musculoskeletal: negative other than that listed in the HPI Neurological: negative other than that listed in the  HPI Allergy/Immunologic: negative other than that listed in the HPI    Objective:   Blood pressure 122/72, pulse 72, temperature 97.8 F (36.6 C), resp. rate 17, SpO2 99 %. There is no height or weight on file to calculate BMI.   Physical Exam:  General: Alert, interactive, in mild distress. Haggard appearing.  Eyes: No conjunctival injection bilaterally, no discharge on the right, no discharge on the left, no Horner-Trantas dots present and allergic shiners present bilaterally. PERRL bilaterally. EOMI without pain. No photophobia.  Ears: Right TM pearly gray with normal light reflex, Left TM pearly gray with normal light reflex, Right TM intact without perforation and Left TM intact without perforation.  Nose/Throat: External nose within normal limits, nasal crease present and septum midline. Turbinates markedly edematous with thick discharge. Posterior oropharynx erythematous with cobblestoning in the posterior oropharynx. Tonsils 3+ without exudates.  Tongue without thrush. Adenopathy: no enlarged lymph nodes appreciated in the anterior cervical, occipital, axillary, epitrochlear, inguinal, or popliteal regions. Lungs: Clear to auscultation without wheezing, rhonchi or rales. No increased work of breathing. Hacking multiple times during the visit.  CV: Normal S1/S2. No murmurs. Capillary refill <2 seconds.  Skin: Warm and dry, without lesions or rashes. Neuro:   Grossly intact. No focal deficits appreciated.  Responsive to questions.  Diagnostic studies:   Spirometry: results normal (FEV1: 2.54/102%, FVC: 3.00/95%, FEV1/FVC: 85%).    Spirometry consistent with normal pattern.   Allergy Studies: none   Salvatore Marvel, MD [facility]

## 2017-02-02 NOTE — Patient Instructions (Addendum)
1. Persistent asthma - Lung testing looks great today. - We will send you home with a nebulizer machine so you can use the neb treatments at home.  - You have gotten prednisone already including a depo shot, so I do not feel that you need more steroid.  - Daily controller medication(s): Use the sample of Symbicort two puffs twice daily provided until it is gone and then switch back to Flovent 180mcg 2 puffs twice daily with spacer - Prior to physical activity: ProAir 2 puffs 10-15 minutes before physical activity. - Rescue medications: ProAir 4 puffs every 4-6 hours as needed or albuterol nebulizer one vial puffs every 4-6 hours as needed - Asthma control goals:  * Full participation in all desired activities (may need albuterol before activity) * Albuterol use two time or less a week on average (not counting use with activity) * Cough interfering with sleep two time or less a month * Oral steroids no more than once a year * No hospitalizations  2. Acute sinusitis - Restart the Levaquin. - Continue with the nasal saline rinses with the budesonide.  3. Return in about 3 weeks (around 02/23/2017).   Please inform us of any Emergency Department visits, hospitalizations, or changes in symptoms. Call us before going to the ED for breathing or allergy symptoms since we might be able to fit you in for a sick visit. Feel free to contact us anytime with any questions, problems, or concerns.  It was a pleasure to meet you today! Enjoy the holiday season!  Websites that have reliable patient information: 1. American Academy of Asthma, Allergy, and Immunology: www.aaaai.org 2. Food Allergy Research and Education (FARE): foodallergy.org 3. Mothers of Asthmatics: http://www.asthmacommunitynetwork.org 4. American College of Allergy, Asthma, and Immunology: www.acaai.org

## 2017-02-02 NOTE — ED Notes (Signed)
Pt was discharged earlier but still showing OTF

## 2017-02-05 ENCOUNTER — Telehealth: Payer: Self-pay | Admitting: Allergy

## 2017-02-05 NOTE — Telephone Encounter (Signed)
Called by pt husband regarding persistent symptoms.  Pt saw Dr. Ernst Bowler on 12/20 for same symptoms.   Husband reports she is taking all medications provided including albuterol neb q4-6hr, tessalon perles, mucinex, levaquin, symbicort 2 puffs bid and she states she is still having signficant coughing and feels like her throat is tight.  Husband feels she is no better.  He states she did received steroid shot in ED last Wednesday and did have a steroid taper several weeks ago that helped (states current symptoms have been on going for past 3 weeks).   Given she has had improvement with symptoms with steroid and she is on abx, ICS/laba and scheduled albuterol I called in prednisone taper 30mg  bidx2d, 20mg  bidx2d, 10mg  bidx2d, 10mg  qdx2 day and stop.  I advised of signs/symptoms to return to ED for further evaluation.

## 2017-02-13 ENCOUNTER — Telehealth: Payer: Self-pay | Admitting: Allergy and Immunology

## 2017-02-13 ENCOUNTER — Encounter: Payer: Self-pay | Admitting: Allergy & Immunology

## 2017-02-13 ENCOUNTER — Ambulatory Visit: Payer: 59 | Admitting: Allergy & Immunology

## 2017-02-13 VITALS — BP 124/76 | HR 80 | Temp 98.0°F | Resp 17

## 2017-02-13 DIAGNOSIS — J3089 Other allergic rhinitis: Secondary | ICD-10-CM | POA: Diagnosis not present

## 2017-02-13 DIAGNOSIS — R05 Cough: Secondary | ICD-10-CM

## 2017-02-13 DIAGNOSIS — R053 Chronic cough: Secondary | ICD-10-CM

## 2017-02-13 DIAGNOSIS — J4530 Mild persistent asthma, uncomplicated: Secondary | ICD-10-CM

## 2017-02-13 MED ORDER — HYDROCOD POLST-CPM POLST ER 10-8 MG/5ML PO SUER: 5.0000 mL | Freq: Two times a day (BID) | ORAL | 0 refills | Status: DC | PRN

## 2017-02-13 NOTE — Telephone Encounter (Signed)
Patient has been sick for 4+ weeks Patient has been seen by BOBBITT and the ER Patient has hade several antibiotics and steroid shots What can she do?? Does she need to be seen?? Please call patient to answer any questions

## 2017-02-13 NOTE — Progress Notes (Signed)
FOLLOW UP  Date of Service/Encounter:  02/14/17   Assessment:   Chronic cough - secondary to a recent severe course of bronchitis  Mild persistent asthma, uncomplicated  GERD - on dual therapy with a PPI and H2 blockade   Perennial allergic rhinitis (molds, dust mites, dogs)  Plan/Recommendations:   1. Chronic cough - likely secondary to prolonged ciliary dysfunction and subsequent airway damage from persistent coughing - Dominique Brock is clearly in an inflammatory process that continues to exacerbate her clinical picture, with continuous cough leading to airway damage and inflammation, which leads to more coughing.  - We need to get her inflammation under control to allow her airway to heal.  - We will give a more prolonged course of prednisone to help control your cough: 30mg  (three tablets) twice daily for four days, 20mg  (two tablets) twice daily for six days, 10mg  (one tablet) twice daily for six days, then STOP. - Prescription provided for Tussionex 60mL every 12 hrs as needed for coughing (you can start at 2.11mL to see if you tolerate this more). - Reflux medications are already maximized. - Lung function looked amazing today, even better than the last visit. - Your nose and ears were pristine. - GERD should be well controlled with the current medications.  - I would try to get in to see ENT a little more quickly.  - I do not think that antibiotics are needed at this time.  2. Return in about 3 months (around 05/14/2017).  Subjective:   Dominique Brock is a 61 y.o. female presenting today for follow up of  Chief Complaint  Patient presents with  . Cough    Dominique Brock has a history of the following: Patient Active Problem List   Diagnosis Date Noted  . Perennial allergic rhinitis 09/19/2016  . Rhinitis medicamentosa 09/19/2016  . 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 Chronic sinusitis 09/19/2016  . Hypothyroidism 02/05/2016  . Prediabetes  02/05/2016  . EPISTAXIS, RECURRENT 03/24/2009  . BRONCHITIS, ACUTE 10/31/2008  . Sinusitis, chronic 02/29/2008  . ABDOMINAL PAIN, RIGHT UPPER QUADRANT 06/29/2007  . LIPOMA OF UNSPECIFIED SITE 06/12/2007  . SYNCOPE, VASOVAGAL 06/12/2007  . Essential hypertension 03/26/2007  . ESSENTIAL HYPERTENSION, BENIGN 02/17/2007  . Moderate persistent asthma 02/17/2007  . Obstructive sleep apnea, hx of 02/17/2007    History obtained from: chart review and patient and her husband.  Dominique Brock Primary Care Provider is Patient, No Pcp Per.     Dominique Brock is a 61 y.o. female presenting for a sick visit. She was last seen on December 20th for a chronic cough following a viral bronchitis. At the time, she had already been treated with doxycyline, and I expanded her coverage to Levaquin. She had also already been treated with prednisone and she received a steroid injection in the ED. We deferred on further prednisone treatment since she did not want to take steroids and she she declined any opioid containing cough medications. I did give her a nebulizer machine so that she could use these at home. We also gave her a sample of Symbicort so that she could use this in lieu of her Flovent while she was recovering. In the interim, she did call Dr. Nelva Bush over Christmas and was started on a prednisone taper. She has been afebrile and aside from the cough and the lack of sleep, she actually is doing rather well. The cough does continue even at night.   Since the last visit,  she has continued to have problems with coughing. The prednisone seemed to help her coughing, but it returned as the taper led to a lower dose. She has been using Robitussin with minimal relief and she has been using her albuterol nebulizer. She remains on the Symbicort and has completed the Levaquin. She is rather frustrated that the cough has persisted and would like something to clear up the cough so that she can "get some sleep".   She does have  a history of GERD and is on a PPI twice daily (increased recently from her GI provider). She is also on Zantac at night. She does have an appointment in less than one month with an ENT provider in [place]. She is trying to get a balloon septoplasty performed. She did see an ENT provider in [place] (maybe [facility]) who wanted to perform surgery. However, Dominique Brock prefers to avoid surgery.   Otherwise, there have been no changes to her past medical history, surgical history, family history, or social history.    Review of Systems: a 14-point review of systems is pertinent for what is mentioned in HPI.  Otherwise, all other systems were negative. Constitutional: negative other than that listed in the HPI Eyes: negative other than that listed in the HPI Ears, nose, mouth, throat, and face: negative other than that listed in the HPI Respiratory: negative other than that listed in the HPI Cardiovascular: negative other than that listed in the HPI Gastrointestinal: negative other than that listed in the HPI Genitourinary: negative other than that listed in the HPI Integument: negative other than that listed in the HPI Hematologic: negative other than that listed in the HPI Musculoskeletal: negative other than that listed in the HPI Neurological: negative other than that listed in the HPI Allergy/Immunologic: negative other than that listed in the HPI    Objective:   Blood pressure 124/76, pulse 80, temperature 98 F (36.7 C), resp. rate 17, SpO2 96 %. There is no height or weight on file to calculate BMI.   Physical Exam:  General: Alert, in no acute distress. Very fatigued appearing. Somewhat Cushingoid appearance. Eyes: No conjunctival injection bilaterally, no discharge on the right, no discharge on the left and no Horner-Trantas dots present. PERRL bilaterally. EOMI without pain. No photophobia.  Ears: Right TM pearly gray with normal light reflex, Left TM pearly gray with normal  light reflex, Right TM intact without perforation and Left TM intact without perforation.  Nose/Throat: External nose within normal limits and septum midline. Turbinates edematous without discharge. Posterior oropharynx mildly erythematous without cobblestoning in the posterior oropharynx. Tonsils 2+ without exudates.  Tongue without thrush. Adenopathy: no enlarged lymph nodes appreciated in the anterior cervical, occipital, axillary, epitrochlear, inguinal, or popliteal regions. Lungs: Clear to auscultation without wheezing, rhonchi or rales. No increased work of breathing. Hacking dry cough throughout the exam.  CV: Normal S1/S2. No murmurs. Capillary refill <2 seconds.  Skin: Warm and dry, without lesions or rashes. Neuro:   Grossly intact. No focal deficits appreciated. Responsive to questions.  Diagnostic studies:  Spirometry: results normal (FEV1: 2.88/115%, FVC: 3.26/103%, FEV1/FVC: 88%).    Spirometry consistent with normal pattern. Overall her values were even higher than when I saw her ten days ago.    Allergy Studies: none      Salvatore Marvel, MD [facility]

## 2017-02-13 NOTE — Patient Instructions (Addendum)
1. Chronic cough - We will give a more prolonged course of prednisone to help control your cough: 30mg  (three tablets) twice daily for four days, 20mg  (two tablets) twice daily for six days, 10mg  (one tablet) twice daily for six days, then STOP. - Prescription provided for Tussionex 43mL every 12 hrs as needed for coughing (you can start at 2.26mL to see if you tolerate this more). - Reflux medications are already maximized. - Lung function looked amazing today, even better than the last visit. - Your nose and ears were pristine. - I would try to get in to see ENT a little more quickly.  - I do not think that antibiotics are needed at this time.  2. Return in about 3 months (around 05/14/2017).  Please inform us of any Emergency Department visits, hospitalizations, or changes in symptoms. Call us before going to the ED for breathing or allergy symptoms since we might be able to fit you in for a sick visit. Feel free to contact us anytime with any questions, problems, or concerns.  It was a pleasure to see you and your family again today! Enjoy the holiday season! FEEL BETTER!   Websites that have reliable patient information: 1. American Academy of Asthma, Allergy, and Immunology: www.aaaai.org 2. Food Allergy Research and Education (FARE): foodallergy.org 3. Mothers of Asthmatics: http://www.asthmacommunitynetwork.org 4. American College of Allergy, Asthma, and Immunology: www.acaai.org

## 2017-02-13 NOTE — Telephone Encounter (Signed)
Spoke with Pt, pt has unresolved cough and has finished a couple of rounds of antibiotics and prednisone. Pt has requested to be seen today and will see Dr. Ernst Bowler this afternoon.

## 2017-02-14 ENCOUNTER — Encounter: Payer: Self-pay | Admitting: Allergy & Immunology

## 2017-02-20 ENCOUNTER — Ambulatory Visit: Payer: 59 | Admitting: Allergy and Immunology

## 2017-03-03 ENCOUNTER — Telehealth: Payer: Self-pay

## 2017-03-03 NOTE — Telephone Encounter (Signed)
Dr. Nelva Bush, can you please advise.

## 2017-03-03 NOTE — Telephone Encounter (Signed)
I reviewed her chart.  Her most recent note did not mention any inhaler medications but I did review her medication list which does have pulmicort and flovent.   Is she currently using any inhaler medications?   If she is using inhaler medications then we can treat for thrush otherwise if not using any inhaler medication do not have a good reason for the mouth symptoms she is having and would recommend PCP or UC take a look to make sure there is not another problem going on.

## 2017-03-03 NOTE — Telephone Encounter (Signed)
Patient thinks she has thrush in her mouth. She has white and yellow patches on her tounge.  Please Advise  Last OV Dr. Ernst Bowler 02/13/17  Tana Coast

## 2017-03-03 NOTE — Telephone Encounter (Signed)
Patient is not using any inhalers, states that she is just taking the prednisone. I advised her to see her PCP or UC to advise.

## 2017-06-06 ENCOUNTER — Encounter: Payer: Self-pay | Admitting: Internal Medicine

## 2017-06-06 ENCOUNTER — Ambulatory Visit: Payer: 59 | Admitting: Internal Medicine

## 2017-06-06 VITALS — BP 124/78 | HR 62 | Ht 64.0 in | Wt 150.6 lb

## 2017-06-06 DIAGNOSIS — E785 Hyperlipidemia, unspecified: Secondary | ICD-10-CM

## 2017-06-06 DIAGNOSIS — R7303 Prediabetes: Secondary | ICD-10-CM

## 2017-06-06 DIAGNOSIS — E039 Hypothyroidism, unspecified: Secondary | ICD-10-CM

## 2017-06-06 LAB — T3, FREE: T3 FREE: 2.9 pg/mL (ref 2.3–4.2)

## 2017-06-06 LAB — TSH: TSH: 3.2 u[IU]/mL (ref 0.35–4.50)

## 2017-06-06 LAB — T4, FREE: Free T4: 0.54 ng/dL — ABNORMAL LOW (ref 0.60–1.60)

## 2017-06-06 LAB — POCT GLYCOSYLATED HEMOGLOBIN (HGB A1C): Hemoglobin A1C: 5.9

## 2017-06-06 NOTE — Progress Notes (Signed)
Patient ID: Dominique Brock, female   DOB: February 11, 1956, 62 y.o.   MRN: [id number]    HPI  Dominique Brock is a 62 y.o.-year-old female, returning for f/u for mild hypothyroidism and prediabetes. She was previously seen by Dr. Chalmers Cater. Last visit with me 6 months ago.  Since last visit, she was on 3 rounds of steroids for asthmatic bronchitis. She had a lot of cough.   Patient had mild h hypothyroidism , dx in 02/2013 (TSH 3.85) >> she was on low-dose levothyroxine before, now off  She was previously on levothyroxine 25 mcg daily, taken: - in am - fasting - at least 30 min from b'fast - no Ca, Fe, MVI, PPIs - not on Biotin  Reviewed patient's TFTs: Lab Results  Component Value Date   TSH 3.02 12/06/2016   TSH 3.40 03/11/2016   FREET4 0.68 12/06/2016   FREET4 0.47 (L) 03/11/2016   T3FREE 2.5 03/11/2016   Her thyroid antibodies were negative: Component     Latest Ref Rng & Units 03/11/2016  Thyroperoxidase Ab SerPl-aCnc     <9 IU/mL <1  Thyroglobulin Ab     <2 IU/mL <1   03/13/2015: TSH 1.23  03/06/2014: TSH 1.98 07/19/2013: TSH 1.95 05/02/2013: TSH 1.86 03/05/2013: TSH 3.85  Pt denies: - feeling nodules in neck - hoarseness - dysphagia - SOB with lying down  + chocking with drinking.  She has no FH of thyroid disorders. No FH of thyroid cancer. No h/o radiation tx to head or neck.  No seaweed or kelp. No recent contrast studies. No herbal supplements. No Biotin use. No recent steroids use.   Prediabetes.  Last hemoglobin A1c reviewed: Lab Results  Component Value Date   HGBA1C 5.6 12/06/2016   HGBA1C 6.1 06/06/2016  11/2015: HbA1c 5.3% 03/13/2015: HbA1c 6.1% (after Prednisone) 03/06/2014: HbA1c 5.9% 03/05/2013: HbA1c 5.4%  Her prediabetes is controlled with diet.  She is not checking sugars at home.  Pt's meals are: - Breakfast: OJ, protein bar or egg and bacon - snack: coffee, yoghurt - Lunch: fast food (chicken nuggets, no fries; occasional soft  drinks) - Dinner: can of soup or meat + veggies, some bread - Snacks: no  -No CKD, last BUN/creatinine:  Lab Results  Component Value Date   BUN 20 12/06/2016   BUN 12 06/29/2007   CREATININE 0.76 12/06/2016   CREATININE 0.8 06/29/2007  03/13/2015: 22/0.77, eGFR 101 -+ HL; last set of lipids: Lab Results  Component Value Date   CHOL 176 12/06/2016   HDL 47.40 12/06/2016   LDLCALC 105 (H) 12/06/2016   TRIG 116.0 12/06/2016   CHOLHDL 4 12/06/2016  03/13/2015: 180/84/40/123 She is not on a statin. - last eye exam was in 2018: No DR -Denies numbness and tingling in her feet.  Pt has FH of DM in mother, MGM, other family members.  ROS: Constitutional: no weight gain/no weight loss, no fatigue, no subjective hyperthermia, no subjective hypothermia Eyes: no blurry vision, no xerophthalmia ENT: no sore throat, + see HPI Cardiovascular: no CP/no SOB/no palpitations/no leg swelling Respiratory: no cough/no SOB/no wheezing Gastrointestinal: no N/no V/no D/no C/no acid reflux Musculoskeletal: + muscle aches/+ joint aches Skin: no rashes, + hair loss Neurological: no tremors/no numbness/no tingling/no dizziness  I reviewed pt's medications, allergies, PMH, social hx, family hx, and changes were documented in the history of present illness. Otherwise, unchanged from my initial visit note.  Past Medical History:  Diagnosis Date  . Asthma   . Diabetes mellitus   .  Hypertension   . Sleep apnea    Past Surgical History:  Procedure Laterality Date  . ABDOMINAL HYSTERECTOMY    . BILATERAL OOPHORECTOMY    . SINUS EXPLORATION  1996   Social History   Social History  . Marital status: Married    Spouse name: N/A  . Number of children: 2   Occupational History  . Buyer   Social History Main Topics  . Smoking status: Never Smoker  . Smokeless tobacco: Never Used  . Alcohol use Yes     Comment: wine, once a week  . Drug use: No   Current Outpatient Medications on File  Prior to Visit  Medication Sig Dispense Refill  . albuterol (PROVENTIL) (2.5 MG/3ML) 0.083% nebulizer solution Take 3 mLs (2.5 mg total) by nebulization every 4 (four) hours as needed for wheezing or shortness of breath. 180 mL 1  . albuterol (VENTOLIN HFA) 108 (90 Base) MCG/ACT inhaler Ventolin HFA 90 mcg/actuation aerosol inhaler  Inhale 2 puffs every 4 hours by inhalation route as needed. 1 Inhaler 1  . atenolol (TENORMIN) 25 MG tablet Take 1 tablet (25 mg total) by mouth daily. 90 tablet 3  . benzonatate (TESSALON) 100 MG capsule Take 2 capsules (200 mg total) by mouth 2 (two) times daily as needed for cough. 20 capsule 0  . budesonide (PULMICORT) 0.5 MG/2ML nebulizer solution Take 2 mLs (0.5 mg total) by nebulization 2 (two) times daily. 120 mL 3  . chlorpheniramine-HYDROcodone (TUSSIONEX PENNKINETIC ER) 10-8 MG/5ML SUER Take 5 mLs by mouth every 12 (twelve) hours as needed for cough. 140 mL 0  . EPINEPHrine (AUVI-Q) 0.3 mg/0.3 mL IJ SOAJ injection Inject 0.3 mLs (0.3 mg total) into the muscle once. 1 Device 2  . estradiol (VIVELLE-DOT) 0.1 MG/24HR Place 1 patch onto the skin. Wear patch x7 days, replace with a new one.    . fluconazole (DIFLUCAN) 150 MG tablet     . fluticasone (FLOVENT HFA) 110 MCG/ACT inhaler Inhale 2 puffs into the lungs 2 (two) times daily. 1 Inhaler 5  . montelukast (SINGULAIR) 10 MG tablet Take 1 tablet (10 mg total) by mouth at bedtime. 30 tablet 5  . pseudoephedrine (SUDAFED) 120 MG 12 hr tablet Take 120 mg by mouth 2 (two) times daily.    . [DISCONTINUED] hydrochlorothiazide 25 MG tablet Take 25 mg by mouth daily.      . [DISCONTINUED] imipramine (TOFRANIL) 25 MG tablet Take 1 tablet (25 mg total) by mouth at bedtime. 15 tablet 0  . [DISCONTINUED] progesterone (PROMETRIUM) 100 MG capsule Take 100 mg by mouth daily.       No current facility-administered medications on file prior to visit.    Allergies  Allergen Reactions  . Azithromycin Rash    REACTION: rash   . Codeine Nausea And Vomiting  . Penicillins Rash  . Food     shellfish   Family History  Problem Relation Age of Onset  . Hypertension Mother   . Hyperlipidemia Mother   . Liver cancer Mother   . Allergies Mother   . Diabetes Mother   . Healthy Father   . Healthy Unknown        2 siblings  . Asthma Maternal Grandmother   . Asthma Cousin   . Allergic rhinitis Neg Hx   . Angioedema Neg Hx   . Eczema Neg Hx   . Immunodeficiency Neg Hx   . Urticaria Neg Hx    PE: BP 124/78   Pulse 62  Ht _0  (1.626 m)   Wt 150 lb 9.6 oz (68.3 kg)   SpO2 98%   BMI 25.85 kg/m  Wt Readings from Last 3 Encounters:  06/06/17 150 lb 9.6 oz (68.3 kg)  12/06/16 153 lb (69.4 kg)  09/19/16 151 lb (68.5 kg)   Constitutional: overweight, in NAD Eyes: PERRLA, EOMI, no exophthalmos ENT: moist mucous membranes, no thyromegaly, no cervical lymphadenopathy Cardiovascular: RRR, No MRG Respiratory: CTA B Gastrointestinal: abdomen soft, NT, ND, BS+ Musculoskeletal: no deformities, strength intact in all 4 Skin: moist, warm, no rashes Neurological: no tremor with outstretched hands, DTR normal in all 4  ASSESSMENT: 1. Hypothyroidism  2. Prediabetes  3. HL  PLAN:  1. Patient with history of mild hypothyroidism, previously started on low-dose levothyroxine, 25 mcg daily, then coming off the medication.  However, she developed fatigue, weight gain, heat and cold intolerance and restarted levothyroxine.  She then again stopped it before last visit and we ended up not starting it since TFTs were normal then.  At this visit, she has more hair loss >> she agrees to start LT4 again if TSH higher. - We reviewed her latest TFTs from 11/2016 together at this visit - We will again check them today: TSH, fT4, fT3  - we discussed about taking the thyroid hormone in case she needs to restart every day, with water, >30 minutes before breakfast, separated by >4 hours from acid reflux medications, calcium, iron,  multivitamins. Pt. is taking it correctly. - I will see her back in 6 months  2. Patient with history of diabetes/prediabetes, diet controlled.  Her highest HbA1c was 6.1% per review the records.  This happened after a course of steroids at the beginning of 2017.  At last visit, HbA1c was great, at 5.6%. - today, HbA1c is 5.9% (slightly higher, but still at goal) - continue checking sugars at different times of the day - check 1x a day, rotating checks - advised for yearly eye exams >> she is UTD - Return to clinic in 6 mo with sugar log   3. HL -Reviewed latest lipid panel from 11/2016: LDL slightly above goal -She continues without statins  Component     Latest Ref Rng & Units 06/06/2017  Triiodothyronine,Free,Serum     2.3 - 4.2 pg/mL 2.9  T4,Free(Direct)     0.60 - 1.60 ng/dL 0.54 (L)  TSH     0.35 - 4.50 uIU/mL 3.20   TSH normal, fT4 slightly low, fT3 normal. No need to restart LT4 for now >> will recommend Hair skin and nails vitamins.  [name], MD PhD [facility]

## 2017-06-06 NOTE — Patient Instructions (Signed)
Please stop at the lab.  Please come back for a follow-up appointment in 6 months.  

## 2017-06-07 ENCOUNTER — Telehealth: Payer: Self-pay | Admitting: Internal Medicine

## 2017-06-07 NOTE — Telephone Encounter (Signed)
Patient calling back for results

## 2017-06-07 NOTE — Telephone Encounter (Signed)
Spoke to patient. Gave results, med and lab instructions. Patient verbalized understanding.

## 2017-07-03 ENCOUNTER — Other Ambulatory Visit: Payer: Self-pay | Admitting: Obstetrics & Gynecology

## 2017-07-03 DIAGNOSIS — R928 Other abnormal and inconclusive findings on diagnostic imaging of breast: Secondary | ICD-10-CM

## 2017-07-06 ENCOUNTER — Ambulatory Visit: Payer: 59

## 2017-07-06 ENCOUNTER — Ambulatory Visit
Admission: RE | Admit: 2017-07-06 | Discharge: 2017-07-06 | Disposition: A | Discharge status: Home / Self Care | Payer: 59 | Source: Ambulatory Visit | Attending: Obstetrics & Gynecology | Admitting: Obstetrics & Gynecology

## 2017-07-06 DIAGNOSIS — R928 Other abnormal and inconclusive findings on diagnostic imaging of breast: Secondary | ICD-10-CM

## 2017-12-07 ENCOUNTER — Ambulatory Visit: Payer: 59 | Admitting: Internal Medicine

## 2017-12-07 DIAGNOSIS — E785 Hyperlipidemia, unspecified: Secondary | ICD-10-CM | POA: Insufficient documentation

## 2017-12-07 DIAGNOSIS — Z0289 Encounter for other administrative examinations: Secondary | ICD-10-CM

## 2017-12-07 NOTE — Progress Notes (Deleted)
Patient ID: Dominique Brock, female   DOB: Sep 23, 1955, 62 y.o.   MRN: [id number]    HPI  Dominique Brock is a 62 y.o.-year-old female, returning for f/u for mild hypothyroidism and prediabetes. She was previously seen by Dr. Chalmers Cater. Last visit with me 6 months ago.  Patient has a history of mild hypothyroidism , dx in 02/2013 (TSH 3.85).  She was previously on levothyroxine 25 mcg daily, now off.  Patient's TFTs remain normal: Lab Results  Component Value Date   TSH 3.20 06/06/2017   TSH 3.02 12/06/2016   TSH 3.40 03/11/2016   FREET4 0.54 (L) 06/06/2017   FREET4 0.68 12/06/2016   FREET4 0.47 (L) 03/11/2016   T3FREE 2.9 06/06/2017   T3FREE 2.5 03/11/2016   Her thyroid antibodies are negative: Component     Latest Ref Rng & Units 03/11/2016  Thyroperoxidase Ab SerPl-aCnc     <9 IU/mL <1  Thyroglobulin Ab     <2 IU/mL <1   03/13/2015: TSH 1.23  03/06/2014: TSH 1.98 07/19/2013: TSH 1.95 05/02/2013: TSH 1.86 03/05/2013: TSH 3.85  Pt denies: - feeling nodules in neck - hoarseness - dysphagia - SOB with lying down She occasionally has choking with drinking, which is not new.  She has no FH of thyroid disorders.No FH of thyroid cancer. No h/o radiation tx to head or neck.  No seaweed or kelp. No recent contrast studies. No herbal supplements. No Biotin use. No recent steroids use.   Prediabetes.  Last hemoglobin A1c r reviewed: Lab Results  Component Value Date   HGBA1C 5.9 06/06/2017   HGBA1C 5.6 12/06/2016   HGBA1C 6.1 06/06/2016  11/2015: HbA1c 5.3% 03/13/2015: HbA1c 6.1% (after Prednisone) 03/06/2014: HbA1c 5.9% 03/05/2013: HbA1c 5.4%  Her controlled diabetes/prediabetes is diet controlled.  She is not checking sugars at home.  Pt's meals are: - Breakfast: OJ, protein bar or egg and bacon - snack: coffee, yoghurt - Lunch: fast food (chicken nuggets, no fries; occasional soft drinks) - Dinner: can of soup or meat + veggies, some bread - Snacks: no  -No  CKD, last BUN/creatinine:  Lab Results  Component Value Date   BUN 20 12/06/2016   BUN 12 06/29/2007   CREATININE 0.76 12/06/2016   CREATININE 0.8 06/29/2007  03/13/2015: 22/0.77, eGFR 101 Not on an ACE inhibitor/ARB.  -+ Dyslipidemia; last set of lipids: Lab Results  Component Value Date   CHOL 176 12/06/2016   HDL 47.40 12/06/2016   LDLCALC 105 (H) 12/06/2016   TRIG 116.0 12/06/2016   CHOLHDL 4 12/06/2016  03/13/2015: 180/84/40/123 Not on a statin.  - last eye exam was in 2018: No DR  - no numbness and tingling in her feet.  Pt has FH of DM in mother, MGM, other family members.  ROS: Constitutional: no weight gain/no weight loss, no fatigue, no subjective hyperthermia, no subjective hypothermia Eyes: no blurry vision, no xerophthalmia ENT: no sore throat, + see HPI Cardiovascular: no CP/no SOB/no palpitations/no leg swelling Respiratory: no cough/no SOB/no wheezing Gastrointestinal: no N/no V/no D/no C/no acid reflux Musculoskeletal: no muscle aches/no joint aches Skin: no rashes, no hair loss Neurological: no tremors/no numbness/no tingling/no dizziness  I reviewed pt's medications, allergies, PMH, social hx, family hx, and changes were documented in the history of present illness. Otherwise, unchanged from my initial visit note.  Past Medical History:  Diagnosis Date  . Asthma   . Diabetes mellitus   . Hypertension   . Sleep apnea    Past Surgical History:  Procedure Laterality Date  . ABDOMINAL HYSTERECTOMY    . BILATERAL OOPHORECTOMY    . SINUS EXPLORATION  1996   Social History   Social History  . Marital status: Married    Spouse name: N/A  . Number of children: 2   Occupational History  . Buyer   Social History Main Topics  . Smoking status: Never Smoker  . Smokeless tobacco: Never Used  . Alcohol use Yes     Comment: wine, once a week  . Drug use: No   Current Outpatient Medications on File Prior to Visit  Medication Sig Dispense Refill   . albuterol (PROVENTIL) (2.5 MG/3ML) 0.083% nebulizer solution Take 3 mLs (2.5 mg total) by nebulization every 4 (four) hours as needed for wheezing or shortness of breath. (Patient not taking: Reported on 06/06/2017) 180 mL 1  . albuterol (VENTOLIN HFA) 108 (90 Base) MCG/ACT inhaler Ventolin HFA 90 mcg/actuation aerosol inhaler  Inhale 2 puffs every 4 hours by inhalation route as needed. (Patient not taking: Reported on 06/06/2017) 1 Inhaler 1  . atenolol (TENORMIN) 25 MG tablet Take 1 tablet (25 mg total) by mouth daily. 90 tablet 3  . budesonide (PULMICORT) 0.5 MG/2ML nebulizer solution Take 2 mLs (0.5 mg total) by nebulization 2 (two) times daily. (Patient not taking: Reported on 06/06/2017) 120 mL 3  . EPINEPHrine (AUVI-Q) 0.3 mg/0.3 mL IJ SOAJ injection Inject 0.3 mLs (0.3 mg total) into the muscle once. 1 Device 2  . estradiol (VIVELLE-DOT) 0.1 MG/24HR Place 1 patch onto the skin. Wear patch x7 days, replace with a new one.    . hydrochlorothiazide (HYDRODIURIL) 25 MG tablet Take 25 mg by mouth daily.    . montelukast (SINGULAIR) 10 MG tablet Take 1 tablet (10 mg total) by mouth at bedtime. (Patient not taking: Reported on 06/06/2017) 30 tablet 5  . pseudoephedrine (SUDAFED) 120 MG 12 hr tablet Take 120 mg by mouth 2 (two) times daily.    . [DISCONTINUED] imipramine (TOFRANIL) 25 MG tablet Take 1 tablet (25 mg total) by mouth at bedtime. 15 tablet 0  . [DISCONTINUED] progesterone (PROMETRIUM) 100 MG capsule Take 100 mg by mouth daily.       No current facility-administered medications on file prior to visit.    Allergies  Allergen Reactions  . Azithromycin Rash    REACTION: rash  . Codeine Nausea And Vomiting  . Penicillins Rash  . Food     shellfish   Family History  Problem Relation Age of Onset  . Hypertension Mother   . Hyperlipidemia Mother   . Liver cancer Mother   . Allergies Mother   . Diabetes Mother   . Healthy Father   . Healthy Unknown        2 siblings  . Asthma  Maternal Grandmother   . Asthma Cousin   . Allergic rhinitis Neg Hx   . Angioedema Neg Hx   . Eczema Neg Hx   . Immunodeficiency Neg Hx   . Urticaria Neg Hx    PE: There were no vitals taken for this visit. Wt Readings from Last 3 Encounters:  06/06/17 150 lb 9.6 oz (68.3 kg)  12/06/16 153 lb (69.4 kg)  09/19/16 151 lb (68.5 kg)   Constitutional: Normal weight, in NAD Eyes: PERRLA, EOMI, no exophthalmos ENT: moist mucous membranes, no thyromegaly, no cervical lymphadenopathy Cardiovascular: RRR, No MRG Respiratory: CTA B Gastrointestinal: abdomen soft, NT, ND, BS+ Musculoskeletal: no deformities, strength intact in all 4 Skin: moist, warm, no  rashes Neurological: no tremor with outstretched hands, DTR normal in all 4  ASSESSMENT: 1. Hypothyroidism  2. Prediabetes  3. HL  PLAN:  1. Patient with history of mild hypothyroidism, previously on low-dose levothyroxine, 25 mcg daily, then coming off the medication.  At last visit TFTs were normal with the exception of a slightly low free T4.  We did not start levothyroxine then. - will check thyroid tests today: TSH, free T3 and fT4 - She agrees to start a low-dose levothyroxine again if the tests worsen. - we discussed that, if we need to start LT 4, she needs to take this every day, with water, >30 minutes before breakfast, separated by >4 hours from acid reflux medications, calcium, iron, multivitamins.  2. Patient with history of controlled diabetes/prediabetes, not on diabetes medications.  At last visit, HbA1c was slightly higher, at 5.9%, but still at goal.  She had a higher HbA1c before, at 6.1% after a course of steroids. - today, HbA1c is ***%  - Advised her to try to check sugars 1x a day or every other day, rotating checks - advised for yearly eye exams >> she is UTD - Return to clinic in 6 mo with sugar log   3. HL - Reviewed latest lipid panel from 11/2016: LDL slightly above goal Lab Results  Component Value Date    CHOL 176 12/06/2016   HDL 47.40 12/06/2016   LDLCALC 105 (H) 12/06/2016   TRIG 116.0 12/06/2016   CHOLHDL 4 12/06/2016  - She is not on a statin.  [name], MD PhD [facility] Endocrinology

## 2018-01-15 ENCOUNTER — Ambulatory Visit: Payer: 59 | Admitting: Family Medicine

## 2018-01-15 ENCOUNTER — Encounter: Payer: Self-pay | Admitting: Family Medicine

## 2018-01-15 VITALS — BP 128/86 | HR 56 | Temp 98.2°F | Ht 64.0 in | Wt 148.9 lb

## 2018-01-15 DIAGNOSIS — E782 Mixed hyperlipidemia: Secondary | ICD-10-CM

## 2018-01-15 DIAGNOSIS — I10 Essential (primary) hypertension: Secondary | ICD-10-CM | POA: Insufficient documentation

## 2018-01-15 DIAGNOSIS — E785 Hyperlipidemia, unspecified: Secondary | ICD-10-CM | POA: Insufficient documentation

## 2018-01-15 DIAGNOSIS — E1159 Type 2 diabetes mellitus with other circulatory complications: Secondary | ICD-10-CM

## 2018-01-15 DIAGNOSIS — E039 Hypothyroidism, unspecified: Secondary | ICD-10-CM

## 2018-01-15 DIAGNOSIS — E119 Type 2 diabetes mellitus without complications: Secondary | ICD-10-CM | POA: Diagnosis not present

## 2018-01-15 DIAGNOSIS — K219 Gastro-esophageal reflux disease without esophagitis: Secondary | ICD-10-CM | POA: Insufficient documentation

## 2018-01-15 DIAGNOSIS — Z7689 Persons encountering health services in other specified circumstances: Secondary | ICD-10-CM | POA: Diagnosis not present

## 2018-01-15 DIAGNOSIS — E1169 Type 2 diabetes mellitus with other specified complication: Secondary | ICD-10-CM

## 2018-01-15 NOTE — Progress Notes (Signed)
New patient office visit note:  Impression and Recommendations:    1. Encounter to establish care with new doctor   2. Type 2 diabetes mellitus without complication, without long-term current use of insulin ([place])   3. Mixed diabetic hyperlipidemia associated with type 2 diabetes mellitus ([place])   4. Hypertension associated with diabetes ([place])   5. Hypothyroidism, unspecified type   6. Gastroesophageal reflux disease, esophagitis presence not specified      1. Establishing as New Patient - Extensive discussion held with patient regarding establishing as a new patient.  Discussed policies and practices here at the clinic, and answered all questions about care team and health management during appointment.  - Need for baseline labs discussed at length with patient.    - Per patient, has a nurse draw her blood at work.  Reviewed that patient should have all results sent here to the clinic, and may come in to discuss her lab work in the near future after obtaining lab work.  - Additionally, patient will obtain her recent lab work from [facility] and have it sent in to the clinic here.  - Advised patient to continue to follow up with specialists as scheduled and recommended.  2. Hypertension - BP elevated on intake today. - Discussed goal BP as less than 130/80.  - Lifestyle changes such as dash diet and engaging in a regular exercise program discussed with patient.  - Ambulatory BP monitoring STRONGLY encouraged. Keep log and bring in next OV.  Reviewed the importance of sitting quietly for 15-20 minutes before measuring BP, with no stimulation emotionally, physically, or through caffeine prior.  3. Prediabetes - Per patient, last A1c was 5.7, and this is her baseline.  - Counseled patient on prevention of disease and discussed dietary and lifestyle modifications as first line.  Importance of low carb/ketogenic diet discussed with patient in addition to regular exercise.   4.  Lifestyle & Preventative Health Maintenance - Advised patient to continue working toward exercising to improve overall mental, physical, and emotional health.    - American Heart Association guidelines for healthy diet, basically Mediterranean diet, and exercise guidelines of 30 minutes 5 days per week or more discussed in detail.  - Health counseling performed.  All questions answered.  - Reviewed the "spokes of the wheel" of mood and health management.  Stressed the importance of ongoing prudent habits, including regular exercise, appropriate sleep hygiene, healthful dietary habits, and prayer/meditation to relax.  - Encouraged patient to engage in daily physical activity, especially a formal exercise routine.  Recommended that the patient eventually strive for at least 150 minutes of moderate cardiovascular activity per week according to guidelines established by the [facility].   - Healthy dietary habits encouraged, including low-carb, and high amounts of lean protein in diet.   - Patient should also consume adequate amounts of water.   Education and routine counseling performed. Handouts provided.   Return for 2) f/up 6-8 wks BP, DM, review of labs.    Please see AVS handed out to patient at the end of our visit for further patient instructions/ counseling done pertaining to today's office visit.    Note:  This document was prepared using Dragon voice recognition software and may include unintentional dictation errors.   This document serves as a record of services personally performed by Mellody Dance, DO. It was created on her behalf by [name], a trained medical scribe. The creation of this record is based  on the scribe's personal observations and the provider's statements to them.   I have reviewed the above medical documentation for accuracy and completeness and I concur.  Mellody Dance, DO 01/24/2018 6:32  AM       --------------------------------------------------------------------------    Subjective:    Chief complaint:   Chief Complaint  Patient presents with  . Establish Care     HPI: Dominique Brock is a pleasant 62 y.o. female who presents to [place] [facility] today to review their medical history with me and establish care.   I asked the patient to review their chronic problem list with me to ensure everything was updated and accurate.    All recent office visits with other providers, any medical records that patient brought in etc  - I reviewed today.     We asked pt to get [place] their medical records from [facility] providers/ specialists that they had seen within the past 3-5 years- if they are in private practice and/or do not work for [employer], [facility], [place], [facility] or [employer] owned practice.  Told them to call their specialists to clarify this if they are not sure.   Notes she would rather take care of herself naturally than take any medications.  Social History Buyer of janitorial supplies, wall dispenser, gloves, [employer], [employer], dial, Social research officer, government.  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  Has been working with them for 13 years.  Jan is her husband.  Married for 40 years; happily.  Sexually active with husband.  Has two kids, son age 3 and daughter 32.  Two randchildren age 24 and 37.  Grandchildren are her daughter's kids.  Her son does not have kids.  Her grandchildren live nearby.  EtOH Use One drink per week on average. Notes "trying to cut back."  Tobacco Use Never smoker. Never did drugs.  4 cups of black tea per day.  Patient is unsatisfied with her weight. Notes she has no time to exercise. Sits all day as a buyer; her hobby is sewing.  Family History Liver cancer in mom.  Mom's mom, mom's sister have diabetes. Strong maternal side  family history.  Father with suspected dementia. "He really hasn't been diagnosed." Was taken to [facility] in May. They wanted to refer him to memory center. He will not go back to [facility]. In the last 6 months, "it's gotten bad. Very bad."  Surgical History Past Surgical History:  Procedure Laterality Date  . ABDOMINAL HYSTERECTOMY    . BILATERAL OOPHORECTOMY    . SINUS EXPLORATION  1996   Had her uterus removed in her early 31's due to fibroid tumors. Had painful cysts on her ovaries; they were removed in 2008-2009. Dr. Nori Riis removed her uterus and ovaries.  Past Medical History Denies family history of breast or uterine cancer, "or any other female cancers."  - Prediabetes Patient states she was told that "[diabetes] is inevitable."  She has had prediabetes for maybe 3-4 years.  Per patient, her highest A1c has been around 5.7  - High Blood Pressure Diagnosed in 2007-2008.  Says her blood pressure "depends on the day."  She does not check it regularly, but knows that sometimes it runs high.  States it's "usually okay at the doctor."  - OBGYN follow-up Had her uterus removed (partial hysterectomy) in her early 9's due to fibroid tumors.  Had painful cysts  on her ovaries; oophorectomy 2008-2009.  Dr. Nori Riis removed her uterus and ovaries.  She continues to follow up with Dr. Nori Riis.  - Hormone Replacement Notes Dr. Nori Riis gave her "the patch" for hormone management and it still wasn't working.  States that he said "you're fine.  You're still within normal range, you're fine," and would not make changes.  This frustrated her.  - Endocrinology  Dr. Cruzita Lederer; established for her thyroid and prediabetes.  Felt she was being "ping-ponged" back and forth between doctors about her thyroid & hormones.  Is taking half of her synthroid "because her hormones are stable."  - Hormone Replacement & Thyroid Management Now goes to [facility] to get her hormone replacement and her thyroid done.  Had a full panel lab workup done through [facility].  Was told she was retaining water and "you need to go see a PCP."  She cannot remember why she was told to seek a PCP due to this blood work, and continues to reiterate "I think it was because I was retaining water."  - [place] Environmental Allergies States that she is allergic to mold, dust mites.  Takes Dymista and uses nasal sinus rinses.  Notes that she has terrible allergies.   Comments that due to her allergies, she avoids alcohol, as it dries her out.   Wt Readings from Last 3 Encounters:  01/15/18 148 lb 14.4 oz (67.5 kg)  06/06/17 150 lb 9.6 oz (68.3 kg)  12/06/16 153 lb (69.4 kg)   BP Readings from Last 3 Encounters:  01/15/18 128/86  06/06/17 124/78  02/13/17 124/76   Pulse Readings from Last 3 Encounters:  01/15/18 (!) 56  06/06/17 62  02/13/17 80   BMI Readings from Last 3 Encounters:  01/15/18 25.56 kg/m  06/06/17 25.85 kg/m  12/06/16 26.26 kg/m    Patient Care Team    Relationship Specialty Notifications Start End  Mellody Dance, DO PCP - General Family Medicine  01/15/18   B[place]itt, [place] Muta, MD Consulting Physician Allergy and Immunology  01/15/18   [name], MD Consulting Physician Endocrinology  01/15/18   Maisie Fus, MD Consulting Physician Obstetrics and Gynecology  01/15/18     Patient Active Problem List   Diagnosis Date Noted  . Hypertension associated with diabetes ([place]) 01/15/2018    Priority: High  . Mixed diabetic hyperlipidemia associated with type 2 diabetes mellitus ([place]) 01/15/2018    Priority: High  . Diabetes mellitus ([place]) 06/15/2016    Priority: High  . Gastroesophageal reflux disease 01/15/2018    Priority: Medium  . Hypothyroidism 02/05/2016    Priority: Medium  . Moderate persistent asthma 02/17/2007    Priority: Medium  . Obstructive sleep apnea, hx of 02/17/2007    Priority: Medium  . Chronic sinusitis 09/19/2016    Priority: Low  . Environmental  allergies 06/15/2016    Priority: Low  . Anemia 06/15/2016    Priority: Low  . Perennial allergic rhinitis 09/19/2016  . Rhinitis medicamentosa 09/19/2016  . 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 Eczema 06/15/2016  . EPISTAXIS, RECURRENT 03/24/2009  . BRONCHITIS, ACUTE 10/31/2008  . Sinusitis, chronic 02/29/2008  . ABDOMINAL PAIN, RIGHT UPPER QUADRANT 06/29/2007  . LIPOMA OF UNSPECIFIED SITE 06/12/2007  . SYNCOPE, VASOVAGAL 06/12/2007       As reported by pt:  Past Medical History:  Diagnosis Date  . Asthma   . Diabetes mellitus   . Eczema 06/15/2016  . Hypertension   . Sleep apnea   .  Thyroid disease      Past Surgical History:  Procedure Laterality Date  . ABDOMINAL HYSTERECTOMY    . BILATERAL OOPHORECTOMY    . SINUS EXPLORATION  1996     Family History  Problem Relation Age of Onset  . Hypertension Mother   . Hyperlipidemia Mother   . Liver cancer Mother 69       died in May 18, 2011, at age 21 or 81.  . Allergies Mother   . Diabetes Mother 89       onset "mid fifties."  . Healthy Father   . Dementia Father 76       not officialy diagnosed  . Healthy Unknown        2 siblings  . Asthma Maternal Grandmother   . Diabetes Maternal Grandmother   . Asthma Cousin   . Allergic rhinitis Neg Hx   . Angioedema Neg Hx   . Eczema Neg Hx   . Immunodeficiency Neg Hx   . Urticaria Neg Hx      Social History   Substance and Sexual Activity  Drug Use No     Social History   Substance and Sexual Activity  Alcohol Use Yes   Comment: less than once a month     Social History   Tobacco Use  Smoking Status Never Smoker  Smokeless Tobacco Never Used     Current Meds  Medication Sig  . atenolol (TENORMIN) 25 MG tablet Take 1 tablet (25 mg total) by mouth daily.  Marland Kitchen levothyroxine (SYNTHROID, LEVOTHROID) 100 MCG tablet TAKE 1 TABLET BY MOUTH ONCE DAILY ON AN EMPTY STOMACH IN THE MORNING    Allergies: Azithromycin; Codeine; Penicillins; and  Food   Review of Systems  Constitutional: Negative for chills, diaphoresis, fever, malaise/fatigue and weight loss.  HENT: Negative for congestion, sore throat and tinnitus.   Eyes: Negative for blurred vision, double vision and photophobia.  Respiratory: Negative for cough and wheezing.   Cardiovascular: Negative for chest pain and palpitations.  Gastrointestinal: Negative for blood in stool, diarrhea, nausea and vomiting.  Genitourinary: Negative for dysuria, frequency and urgency.  Musculoskeletal: Negative for joint pain and myalgias.  Skin: Negative for itching and rash.  Neurological: Negative for dizziness, focal weakness, weakness and headaches.  Endo/Heme/Allergies: Negative for environmental allergies and polydipsia. Does not bruise/bleed easily.  Psychiatric/Behavioral: Negative for depression and memory loss. The patient is not nervous/anxious and does not have insomnia.         Objective:   Blood pressure 128/86, pulse (!) 56, temperature 98.2 F (36.8 C), height 5\' 4"  (1.626 m), weight 148 lb 14.4 oz (67.5 kg), SpO2 100 %. Body mass index is 25.56 kg/m. General: Well Developed, well nourished, and in no acute distress.  Neuro: Alert and oriented x3, extra-ocular muscles intact, sensation grossly intact.  HEENT:[place]/AT, PERRLA, neck supple, No carotid bruits Skin: no gross rashes  Cardiac: Regular rate and rhythm Respiratory: Essentially clear to auscultation bilaterally. Not using accessory muscles, speaking in full sentences.  Abdominal: not grossly distended Musculoskeletal: Ambulates w/o diff, FROM * 4 ext.  Vasc: less 2 sec cap RF, warm and pink  Psych:  No HI/SI, judgement and insight good, Euthymic mood. Full Affect.    No results found for this or any previous visit (from the past 05-18-58 hour(s)).

## 2018-01-15 NOTE — Patient Instructions (Addendum)
-Please obtain labs from the folks at your work-CBC, CMP, fasting lipid profile, vitamin D, A1c, TSH, free T3 and free T4.  Once resulted, please come in and make a follow-up appointment so we can go over all of them and addres additional concerns.   Your goal blood pressure should be 135/85 or less on a regular basis.  Normal blood pressure is 120/80 or less.    Hypertension  Hypertension, commonly called high blood pressure, is when the force of blood pumping through the arteries is too strong. The arteries are the blood vessels that carry blood from the heart throughout the body. Hypertension forces the heart to work harder to pump blood and may cause arteries to become narrow or stiff. Having untreated or uncontrolled hypertension can cause heart attacks, strokes, kidney disease, and other problems. A blood pressure reading consists of a higher number over a lower number. Ideally, your blood pressure should be below 120/80. The first ("top") number is called the systolic pressure. It is a measure of the pressure in your arteries as your heart beats. The second ("bottom") number is called the diastolic pressure. It is a measure of the pressure in your arteries as the heart relaxes. What are the causes? The cause of this condition is not known. What increases the risk? Some risk factors for high blood pressure are under your control. Others are not. Factors you can change  Smoking.  Having type 2 diabetes mellitus, high cholesterol, or both.  Not getting enough exercise or physical activity.  Being overweight.  Having too much fat, sugar, calories, or salt (sodium) in your diet.  Drinking too much alcohol. Factors that are difficult or impossible to change  Having chronic kidney disease.  Having a family history of high blood pressure.  Age. Risk increases with age.  Race. You may be at higher risk if you are African-American.  Gender. Men are at higher risk than women before  age 39. After age 28, women are at higher risk than men.  Having obstructive sleep apnea.  Stress. What are the signs or symptoms? Extremely high blood pressure (hypertensive crisis) may cause:  Headache.  Anxiety.  Shortness of breath.  Nosebleed.  Nausea and vomiting.  Severe chest pain.  Jerky movements you cannot control (seizures).  How is this diagnosed? This condition is diagnosed by measuring your blood pressure while you are seated, with your arm resting on a surface. The cuff of the blood pressure monitor will be placed directly against the skin of your upper arm at the level of your heart. It should be measured at least twice using the same arm. Certain conditions can cause a difference in blood pressure between your right and left arms. Certain factors can cause blood pressure readings to be lower or higher than normal (elevated) for a short period of time:  When your blood pressure is higher when you are in a health care provider's office than when you are at home, this is called white coat hypertension. Most people with this condition do not need medicines.  When your blood pressure is higher at home than when you are in a health care provider's office, this is called masked hypertension. Most people with this condition may need medicines to control blood pressure.  If you have a high blood pressure reading during one visit or you have normal blood pressure with other risk factors:  You may be asked to return on a different day to have your blood pressure checked  again.  You may be asked to monitor your blood pressure at home for 1 week or longer.  If you are diagnosed with hypertension, you may have other blood or imaging tests to help your health care provider understand your overall risk for other conditions. How is this treated? This condition is treated by making healthy lifestyle changes, such as eating healthy foods, exercising more, and reducing your  alcohol intake. Your health care provider may prescribe medicine if lifestyle changes are not enough to get your blood pressure under control, and if:  Your systolic blood pressure is above 130.  Your diastolic blood pressure is above 80.  Your personal target blood pressure may vary depending on your medical conditions, your age, and other factors. Follow these instructions at home: Eating and drinking  Eat a diet that is high in fiber and potassium, and low in sodium, added sugar, and fat. An example eating plan is called the DASH (Dietary Approaches to Stop Hypertension) diet. To eat this way: ? Eat plenty of fresh fruits and vegetables. Try to fill half of your plate at each meal with fruits and vegetables. ? Eat whole grains, such as whole wheat pasta, brown rice, or whole grain bread. Fill about one quarter of your plate with whole grains. ? Eat or drink low-fat dairy products, such as skim milk or low-fat yogurt. ? Avoid fatty cuts of meat, processed or cured meats, and poultry with skin. Fill about one quarter of your plate with lean proteins, such as fish, chicken without skin, beans, eggs, and tofu. ? Avoid premade and processed foods. These tend to be higher in sodium, added sugar, and fat.  Reduce your daily sodium intake. Most people with hypertension should eat less than 1,500 mg of sodium a day.  Limit alcohol intake to no more than 1 drink a day for nonpregnant women and 2 drinks a day for men. One drink equals 12 oz of beer, 5 oz of wine, or 1 oz of hard liquor. Lifestyle  Work with your health care provider to maintain a healthy body weight or to lose weight. Ask what an ideal weight is for you.  Get at least 30 minutes of exercise that causes your heart to beat faster (aerobic exercise) most days of the week. Activities may include walking, swimming, or biking.  Include exercise to strengthen your muscles (resistance exercise), such as pilates or lifting weights, as part  of your weekly exercise routine. Try to do these types of exercises for 30 minutes at least 3 days a week.  Do not use any products that contain nicotine or tobacco, such as cigarettes and e-cigarettes. If you need help quitting, ask your health care provider.  Monitor your blood pressure at home as told by your health care provider.  Keep all follow-up visits as told by your health care provider. This is important. Medicines  Take over-the-counter and prescription medicines only as told by your health care provider. Follow directions carefully. Blood pressure medicines must be taken as prescribed.  Do not skip doses of blood pressure medicine. Doing this puts you at risk for problems and can make the medicine less effective.  Ask your health care provider about side effects or reactions to medicines that you should watch for. Contact a health care provider if:  You think you are having a reaction to a medicine you are taking.  You have headaches that keep coming back (recurring).  You feel dizzy.  You have swelling in your  ankles.  You have trouble with your vision. Get help right away if:  You develop a severe headache or confusion.  You have unusual weakness or numbness.  You feel faint.  You have severe pain in your chest or abdomen.  You vomit repeatedly.  You have trouble breathing. Summary  Hypertension is when the force of blood pumping through your arteries is too strong. If this condition is not controlled, it may put you at risk for serious complications.  Your personal target blood pressure may vary depending on your medical conditions, your age, and other factors. For most people, a normal blood pressure is less than 120/80.  Hypertension is treated with lifestyle changes, medicines, or a combination of both. Lifestyle changes include weight loss, eating a healthy, low-sodium diet, exercising more, and limiting alcohol. This information is not intended to  replace advice given to you by your health care provider. Make sure you discuss any questions you have with your health care provider. Document Released: 01/31/2005 Document Revised: 12/30/2015 Document Reviewed: 12/30/2015 Elsevier Interactive Patient Education  2018 [employer].    How to Take Your Blood Pressure   Blood pressure is a measurement of how strongly your blood is pressing against the walls of your arteries. Arteries are blood vessels that carry blood from your heart throughout your body. Your health care provider takes your blood pressure at each office visit. You can also take your own blood pressure at home with a blood pressure machine. You may need to take your own blood pressure:  To confirm a diagnosis of high blood pressure (hypertension).  To monitor your blood pressure over time.  To make sure your blood pressure medicine is working.  Supplies needed: To take your blood pressure, you will need a blood pressure machine. You can buy a blood pressure machine, or blood pressure monitor, at most drugstores or online. There are several types of home blood pressure monitors. When choosing one, consider the following:  Choose a monitor that has an arm cuff.  Choose a monitor that wraps snugly around your upper arm. You should be able to fit only one finger between your arm and the cuff.  Do not choose a monitor that measures your blood pressure from your wrist or finger.  Your health care provider can suggest a reliable monitor that will meet your needs. How to prepare To get the most accurate reading, avoid the following for 30 minutes before you check your blood pressure:  Drinking caffeine.  Drinking alcohol.  Eating.  Smoking.  Exercising.  Five minutes before you check your blood pressure:  Empty your bladder.  Sit quietly without talking in a dining chair, rather than in a soft couch or armchair.  How to take your blood pressure To check your  blood pressure, follow the instructions in the manual that came with your blood pressure monitor. If you have a digital blood pressure monitor, the instructions may be as follows: 1. Sit up straight. 2. Place your feet on the floor. Do not cross your ankles or legs. 3. Rest your left arm at the level of your heart on a table or desk or on the arm of a chair. 4. Pull up your shirt sleeve. 5. Wrap the blood pressure cuff around the upper part of your left arm, 1 inch (2.5 cm) above your elbow. It is best to wrap the cuff around bare skin. 6. Fit the cuff snugly around your arm. You should be able to place only  one finger between the cuff and your arm. 7. Position the cord inside the groove of your elbow. 8. Press the power button. 9. Sit quietly while the cuff inflates and deflates. 10. Read the digital reading on the monitor screen and write it down (record it). 11. Wait 2-3 minutes, then repeat the steps, starting at step 1.  What does my blood pressure reading mean? A blood pressure reading consists of a higher number over a lower number. Ideally, your blood pressure should be below 120/80. The first ("top") number is called the systolic pressure. It is a measure of the pressure in your arteries as your heart beats. The second ("bottom") number is called the diastolic pressure. It is a measure of the pressure in your arteries as the heart relaxes. Blood pressure is classified into four stages. The following are the stages for adults who do not have a short-term serious illness or a chronic condition. Systolic pressure and diastolic pressure are measured in a unit called mm Hg. Normal  Systolic pressure: below 474.  Diastolic pressure: below 80. Elevated  Systolic pressure: 259-563.  Diastolic pressure: below 80. Hypertension stage 1  Systolic pressure: 875-643.  Diastolic pressure: 32-95. Hypertension stage 2  Systolic pressure: 188 or above.  Diastolic pressure: 90 or above. You  can have prehypertension or hypertension even if only the systolic or only the diastolic number in your reading is higher than normal. Follow these instructions at home:  Check your blood pressure as often as recommended by your health care provider.  Take your monitor to the next appointment with your health care provider to make sure: ? That you are using it correctly. ? That it provides accurate readings.  Be sure you understand what your goal blood pressure numbers are.  Tell your health care provider if you are having any side effects from blood pressure medicine. Contact a health care provider if:  Your blood pressure is consistently high. Get help right away if:  Your systolic blood pressure is higher than 180.  Your diastolic blood pressure is higher than 110. This information is not intended to replace advice given to you by your health care provider. Make sure you discuss any questions you have with your health care provider. Document Released: 07/10/2015 Document Revised: 09/22/2015 Document Reviewed: 07/10/2015 Elsevier Interactive Patient Education  2018 [employer].     Please realize, EXERCISE IS MEDICINE!  -  American Heart Association ( AHA) guidelines for exercise : If you are in good health, without any medical conditions, you should engage in 150-300 minutes of moderate intensity aerobic activity per week.  This means you should be huffing and puffing throughout your workout.   Engaging in regular exercise will improve brain function and memory, as well as improve mood, boost immune system and help with weight management.  As well as the other, more well-known effects of exercise such as decreasing blood sugar levels, decreasing blood pressure,  and decreasing bad cholesterol levels/ increasing good cholesterol levels.     -  The AHA strongly endorses consumption of a diet that contains a variety of foods from all the food categories with an emphasis on fruits  and vegetables; fat-free and low-fat dairy products; cereal and grain products; legumes and nuts; and fish, poultry, and/or extra lean meats.    Excessive food intake, especially of foods high in saturated and trans fats, sugar, and salt, should be avoided.    Adequate water intake of roughly 1/2 of your weight in  pounds, should equal the ounces of water per day you should drink.  So for instance, if you're 200 pounds, that would be 100 ounces of water per day.         Mediterranean Diet  Why follow it? Research shows  Those who follow the Mediterranean diet have a reduced risk of heart disease   The diet is associated with a reduced incidence of Parkinson's and Alzheimer's diseases  People following the diet may have longer life expectancies and lower rates of chronic diseases   The Dietary Guidelines for Americans recommends the Mediterranean diet as an eating plan to promote health and prevent disease  What Is the Mediterranean Diet?   Healthy eating plan based on typical foods and recipes of Mediterranean-style cooking  The diet is primarily a plant based diet; these foods should make up a majority of meals   Starches - Plant based foods should make up a majority of meals - They are an important sources of vitamins, minerals, energy, antioxidants, and fiber - Choose whole grains, foods high in fiber and minimally processed items  - Typical grain sources include wheat, oats, barley, corn, brown rice, bulgar, farro, millet, polenta, couscous  - Various types of beans include chickpeas, lentils, fava beans, black beans, white beans   Fruits  Veggies - Large quantities of antioxidant rich fruits & veggies; 6 or more servings  - Vegetables can be eaten raw or lightly drizzled with oil and cooked  - Vegetables common to the traditional Mediterranean Diet include: artichokes, arugula, beets, broccoli, brussel sprouts, cabbage, carrots, celery, collard greens, cucumbers, eggplant, kale,  leeks, lemons, lettuce, mushrooms, okra, onions, peas, peppers, potatoes, pumpkin, radishes, rutabaga, shallots, spinach, sweet potatoes, turnips, zucchini - Fruits common to the Mediterranean Diet include: apples, apricots, avocados, cherries, clementines, dates, figs, grapefruits, grapes, melons, nectarines, oranges, peaches, pears, pomegranates, strawberries, tangerines  Fats - Replace butter and margarine with healthy oils, such as olive oil, canola oil, and tahini  - Limit nuts to no more than a handful a day  - Nuts include walnuts, almonds, pecans, pistachios, pine nuts  - Limit or avoid candied, honey roasted or heavily salted nuts - Olives are central to the [employer] - can be eaten whole or used in a variety of dishes   Meats Protein - Limiting red meat: no more than a few times a month - When eating red meat: choose lean cuts and keep the portion to the size of deck of cards - Eggs: approx. 0 to 4 times a week  - Fish and lean poultry: at least 2 a week  - Healthy protein sources include, chicken, [place], lean beef, lamb - Increase intake of seafood such as tuna, salmon, trout, mackerel, shrimp, scallops - Avoid or limit high fat processed meats such as sausage and bacon  Dairy - Include moderate amounts of low fat dairy products  - Focus on healthy dairy such as fat free yogurt, skim milk, low or reduced fat cheese - Limit dairy products higher in fat such as whole or 2% milk, cheese, ice cream  Alcohol - Moderate amounts of red wine is ok  - No more than 5 oz daily for women (all ages) and men older than age 62  - No more than 10 oz of wine daily for men younger than 70  Other - Limit sweets and other desserts  - Use herbs and spices instead of salt to flavor foods  - Herbs and spices common to the  traditional Mediterranean Diet include: basil, bay leaves, chives, cloves, cumin, fennel, garlic, lavender, marjoram, mint, oregano, parsley, pepper, rosemary, sage, savory,  sumac, tarragon, thyme   Its not just a diet, its a lifestyle:   The Mediterranean diet includes lifestyle factors typical of those in the region   Foods, drinks and meals are best eaten with others and savored  Daily physical activity is important for overall good health  This could be strenuous exercise like running and aerobics  This could also be more leisurely activities such as walking, housework, yard-work, or taking the stairs  Moderation is the key; a balanced and healthy diet accommodates most foods and drinks  Consider portion sizes and frequency of consumption of certain foods   Meal Ideas & Options:   Breakfast:  o Whole wheat toast or whole wheat English muffins with peanut butter & hard boiled egg o Steel cut oats topped with apples & cinnamon and skim milk  o Fresh fruit: banana, strawberries, melon, berries, peaches  o Smoothies: strawberries, bananas, greek yogurt, peanut butter o Low fat greek yogurt with blueberries and granola  o Egg white omelet with spinach and mushrooms o Breakfast couscous: whole wheat couscous, apricots, skim milk, cranberries   Sandwiches:  o Hummus and grilled vegetables (peppers, zucchini, squash) on whole wheat bread   o Grilled chicken on whole wheat pita with lettuce, tomatoes, cucumbers or tzatziki  o Tuna salad on whole wheat bread: tuna salad made with greek yogurt, olives, red peppers, capers, green onions o Garlic rosemary lamb pita: lamb sauted with garlic, rosemary, salt & pepper; add lettuce, cucumber, greek yogurt to pita - flavor with lemon juice and black pepper   Seafood:  o Mediterranean grilled salmon, seasoned with garlic, basil, parsley, lemon juice and black pepper o Shrimp, lemon, and spinach whole-grain pasta salad made with low fat greek yogurt  o Seared scallops with lemon orzo  o Seared tuna steaks seasoned salt, pepper, coriander topped with tomato mixture of olives, tomatoes, olive oil, minced garlic,  parsley, green onions and cappers   Meats:  o Herbed greek chicken salad with kalamata olives, cucumber, feta  o Red bell peppers stuffed with spinach, bulgur, lean ground beef (or lentils) & topped with feta   o Kebabs: skewers of chicken, tomatoes, onions, zucchini, squash  o [place] burgers: made with red onions, mint, dill, lemon juice, feta cheese topped with roasted red peppers  Vegetarian o Cucumber salad: cucumbers, artichoke hearts, celery, red onion, feta cheese, tossed in olive oil & lemon juice  o Hummus and whole grain pita points with a greek salad (lettuce, tomato, feta, olives, cucumbers, red onion) o Lentil soup with celery, carrots made with vegetable broth, garlic, salt and pepper  o Tabouli salad: parsley, bulgur, mint, scallions, cucumbers, tomato, radishes, lemon juice, olive oil, salt and pepper.

## 2018-03-02 LAB — HEPATIC FUNCTION PANEL
ALK PHOS: 67 (ref 25–125)
ALT: 20 (ref 7–35)
AST: 19 (ref 13–35)
Bilirubin, Total: 0.7

## 2018-03-02 LAB — HEMOGLOBIN A1C: Hemoglobin A1C: 6.1

## 2018-03-02 LAB — BASIC METABOLIC PANEL
BUN: 21 (ref 4–21)
CREATININE: 1 (ref 0.5–1.1)
Glucose: 84
Potassium: 4.6 (ref 3.4–5.3)
SODIUM: 140 (ref 137–147)

## 2018-03-02 LAB — VITAMIN D 25 HYDROXY (VIT D DEFICIENCY, FRACTURES): VIT D 25 HYDROXY: 46

## 2018-03-07 LAB — LIPID PANEL
Cholesterol: 205 — AB (ref 0–200)
HDL: 44 (ref 35–70)
LDL Cholesterol: 142
TRIGLYCERIDES: 89 (ref 40–160)

## 2018-03-07 LAB — CBC AND DIFFERENTIAL
HEMATOCRIT: 50 — AB (ref 36–46)
Hemoglobin: 16.8 — AB (ref 12.0–16.0)
Neutrophils Absolute: 4220
Platelets: 268 (ref 150–399)

## 2018-03-07 LAB — TSH: TSH: 2.04 (ref 0.41–5.90)

## 2018-03-13 ENCOUNTER — Ambulatory Visit: Payer: 59 | Admitting: Family Medicine

## 2018-03-13 ENCOUNTER — Encounter: Payer: Self-pay | Admitting: Family Medicine

## 2018-03-13 VITALS — BP 150/81 | HR 60 | Temp 97.7°F | Ht 64.0 in | Wt 153.2 lb

## 2018-03-13 DIAGNOSIS — E785 Hyperlipidemia, unspecified: Secondary | ICD-10-CM

## 2018-03-13 DIAGNOSIS — K219 Gastro-esophageal reflux disease without esophagitis: Secondary | ICD-10-CM

## 2018-03-13 DIAGNOSIS — E039 Hypothyroidism, unspecified: Secondary | ICD-10-CM

## 2018-03-13 DIAGNOSIS — D1722 Benign lipomatous neoplasm of skin and subcutaneous tissue of left arm: Secondary | ICD-10-CM

## 2018-03-13 DIAGNOSIS — I10 Essential (primary) hypertension: Secondary | ICD-10-CM | POA: Diagnosis not present

## 2018-03-13 DIAGNOSIS — R7303 Prediabetes: Secondary | ICD-10-CM

## 2018-03-13 MED ORDER — OMEPRAZOLE 20 MG PO CPDR: 20.0000 mg | DELAYED_RELEASE_CAPSULE | Freq: Every day | ORAL | 3 refills | Status: DC

## 2018-03-13 MED ORDER — ATORVASTATIN CALCIUM 40 MG PO TABS: 40.0000 mg | ORAL_TABLET | Freq: Every day | ORAL | 1 refills | Status: DC

## 2018-03-13 MED ORDER — LOSARTAN POTASSIUM 50 MG PO TABS: 50.0000 mg | ORAL_TABLET | Freq: Every day | ORAL | 1 refills | Status: DC

## 2018-03-13 NOTE — Patient Instructions (Addendum)
Losartan - begin with half tablet daily for 6-8 days.  If the blood pressure is not at goal of less than 135/85, increase to one full tablet of losartan daily.  Lipitor - 40 mg tablet prescribed, start with a half tablet before bedtime every night.  Do this for 8-10 days.  Drink adequate amounts of water, 1/2 of your weight in oz of water per day. Then increase to one full tablet of Lipitor if tolerated.   Risk factors for prediabetes and type 2 diabetes  Researchers don't fully understand why some people develop prediabetes and type 2 diabetes and others don't.  It's clear that certain factors increase the risk, however, including:  Weight. The more fatty tissue you have, the more resistant your cells become to insulin.  Inactivity. The less active you are, the greater your risk. Physical activity helps you control your weight, uses up glucose as energy and makes your cells more sensitive to insulin.  Family history. Your risk increases if a parent or sibling has type 2 diabetes.  Race. Although it's unclear why, people of certain races -- including blacks, Hispanics, American Indians and Asian-Americans -- are at higher risk.  Age. Your risk increases as you get older. This may be because you tend to exercise less, lose muscle mass and gain weight as you age. But type 2 diabetes is also increasing dramatically among children, adolescents and younger adults.  Gestational diabetes. If you developed gestational diabetes when you were pregnant, your risk of developing prediabetes and type 2 diabetes later increases. If you gave birth to a baby weighing more than 9 pounds (4 kilograms), you're also at risk of type 2 diabetes.  Polycystic ovary syndrome. For women, having polycystic ovary syndrome -- a common condition characterized by irregular menstrual periods, excess hair growth and obesity -- increases the risk of diabetes.  High blood pressure. Having blood pressure over 140/90 millimeters of  mercury (mm Hg) is linked to an increased risk of type 2 diabetes.  Abnormal cholesterol and triglyceride levels. If you have low levels of high-density lipoprotein (HDL), or "good," cholesterol, your risk of type 2 diabetes is higher. Triglycerides are another type of fat carried in the blood. People with high levels of triglycerides have an increased risk of type 2 diabetes. Your doctor can let you know what your cholesterol and triglyceride levels are.  A good guide to good carbs: The glycemic index ---If you have diabetes, or at risk for diabetes, you know all too well that when you eat carbohydrates, your blood sugar goes up. The total amount of carbs you consume at a meal or in a snack mostly determines what your blood sugar will do. But the food itself also plays a role. A serving of white rice has almost the same effect as eating pure table sugar -- a quick, high spike in blood sugar. A serving of lentils has a slower, smaller effect.  ---Picking good sources of carbs can help you control your blood sugar and your weight. Even if you don't have diabetes, eating healthier carbohydrate-rich foods can help ward off a host of chronic conditions, from heart disease to various cancers to, well, diabetes.  ---One way to choose foods is with the glycemic index (GI). This tool measures how much a food boosts blood sugar.  The glycemic index rates the effect of a specific amount of a food on blood sugar compared with the same amount of pure glucose. A food with a glycemic index of  28 boosts blood sugar only 28% as much as pure glucose. One with a GI of 95 acts like pure glucose.    High glycemic foods result in a quick spike in insulin and blood sugar (also known as blood glucose).  Low glycemic foods have a slower, smaller effect- these are healthier for you.   Using the glycemic index Using the glycemic index is easy: choose foods in the low GI category instead of those in the high GI category (see  below), and go easy on those in between. Low glycemic index (GI of 55 or less): Most fruits and vegetables, beans, minimally processed grains, pasta, low-fat dairy foods, and nuts.  Moderate glycemic index (GI 56 to 69): White and sweet potatoes, corn, white rice, couscous, breakfast cereals such as Cream of Wheat and Mini Wheats.  High glycemic index (GI of 70 or higher): White bread, rice cakes, most crackers, bagels, cakes, doughnuts, croissants, most packaged breakfast cereals. You can see the values for 100 commons foods and get links to more at www.health.CheapToothpicks.si.  Swaps for lowering glycemic index  Instead of this high-glycemic index food Eat this lower-glycemic index food  White rice Brown rice or converted rice  Instant oatmeal Steel-cut oats  Cornflakes Bran flakes  Baked potato Pasta, bulgur  White bread Whole-grain bread  Corn Peas or leafy greens       Prediabetes Eating Plan  Prediabetes--also called impaired glucose tolerance or impaired fasting glucose--is a condition that causes blood sugar (blood glucose) levels to be higher than normal. Following a healthy diet can help to keep prediabetes under control. It can also help to lower the risk of type 2 diabetes and heart disease, which are increased in people who have prediabetes. Along with regular exercise, a healthy diet:  Promotes weight loss.  Helps to control blood sugar levels.  Helps to improve the way that the body uses insulin.   WHAT DO I NEED TO KNOW ABOUT THIS EATING PLAN?   Use the glycemic index (GI) to plan your meals. The index tells you how quickly a food will raise your blood sugar. Choose low-GI foods. These foods take a longer time to raise blood sugar.  Pay close attention to the amount of carbohydrates in the food that you eat. Carbohydrates increase blood sugar levels.  Keep track of how many calories you take in. Eating the right amount of calories will help you to achieve a  healthy weight. Losing about 7 percent of your starting weight can help to prevent type 2 diabetes.  You may want to follow a Mediterranean diet. This diet includes a lot of vegetables, lean meats or fish, whole grains, fruits, and healthy oils and fats.   WHAT FOODS CAN I EAT?  Grains Whole grains, such as whole-wheat or whole-grain breads, crackers, cereals, and pasta. Unsweetened oatmeal. Bulgur. Barley. Quinoa. Brown rice. Corn or whole-wheat flour tortillas or taco shells. Vegetables Lettuce. Spinach. Peas. Beets. Cauliflower. Cabbage. Broccoli. Carrots. Tomatoes. Squash. Eggplant. Herbs. Peppers. Onions. Cucumbers. Brussels sprouts. Fruits Berries. Bananas. Apples. Oranges. Grapes. Papaya. Mango. Pomegranate. Kiwi. Grapefruit. Cherries. Meats and Other Protein Sources Seafood. Lean meats, such as chicken and [place] or lean cuts of pork and beef. Tofu. Eggs. Nuts. Beans. Dairy Low-fat or fat-free dairy products, such as yogurt, cottage cheese, and cheese. Beverages Water. Tea. Coffee. Sugar-free or diet soda. Seltzer water. Milk. Milk alternatives, such as soy or almond milk. Condiments Mustard. Relish. Low-fat, low-sugar ketchup. Low-fat, low-sugar barbecue sauce. Low-fat or fat-free mayonnaise.  Sweets and Desserts Sugar-free or low-fat pudding. Sugar-free or low-fat ice cream and other frozen treats. Fats and Oils Avocado. Walnuts. Olive oil. The items listed above may not be a complete list of recommended foods or beverages. Contact your dietitian for more options.    WHAT FOODS ARE NOT RECOMMENDED?  Grains Refined white flour and flour products, such as bread, pasta, snack foods, and cereals. Beverages Sweetened drinks, such as sweet iced tea and soda. Sweets and Desserts Baked goods, such as cake, cupcakes, pastries, cookies, and cheesecake. The items listed above may not be a complete list of foods and beverages to avoid. Contact your dietitian for more information.    This information is not intended to replace advice given to you by your health care provider. Make sure you discuss any questions you have with your health care provider.   Document Released: 06/17/2014 Document Reviewed: 06/17/2014 Elsevier Interactive Patient Education 2016 [place] for a Low Cholesterol, Low Saturated Fat Diet   Fats - Limit total intake of fats and oils. - Avoid butter, stick margarine, shortening, lard, palm and coconut oils. - Limit mayonnaise, salad dressings, gravies and sauces, unless they are homemade with low-fat ingredients. - Limit chocolate. - Choose low-fat and nonfat products, such as low-fat mayonnaise, low-fat or non-hydrogenated peanut butter, low-fat or fat-free salad dressings and nonfat gravy. - Use vegetable oil, such as canola or olive oil. - Look for margarine that does not contain trans fatty acids. - Use nuts in moderate amounts. - Read ingredient labels carefully to determine both amount and type of fat present in foods. Limit saturated and trans fats! - Avoid high-fat processed and convenience foods.  Meats and Meat Alternatives - Choose fish, chicken, [place] and lean meats. - Use dried beans, peas, lentils and tofu. - Limit egg yolks to three to four per week. - If you eat red meat, limit to no more than three servings per week and choose loin or round cuts. - Avoid fatty meats, such as bacon, sausage, franks, luncheon meats and ribs. - Avoid all organ meats, including liver.  Dairy - Choose nonfat or low-fat milk, yogurt and cottage cheese. - Most cheeses are high in fat. Choose cheeses made from non-fat milk, such as mozzarella and ricotta cheese. - Choose light or fat-free cream cheese and sour cream. - Avoid cream and sauces made with cream.  Fruits and Vegetables - Eat a wide variety of fruits and vegetables. - Use lemon juice, vinegar or "mist" olive oil on vegetables. - Avoid adding sauces, fat or oil  to vegetables.  Breads, Cereals and Grains - Choose whole-grain breads, cereals, pastas and rice. - Avoid high-fat snack foods, such as granola, cookies, pies, pastries, doughnuts and croissants.  Cooking Tips - Avoid deep fried foods. - Trim visible fat off meats and remove skin from poultry before cooking. - Bake, broil, boil, poach or roast poultry, fish and lean meats. - Drain and discard fat that drains out of meat as you cook it. - Add little or no fat to foods. - Use vegetable oil sprays to grease pans for cooking or baking. - Steam vegetables. - Use herbs or no-oil marinades to flavor foods.

## 2018-03-13 NOTE — Progress Notes (Signed)
Assessment and plan:  1. Hyperlipidemia, unspecified hyperlipidemia type   2. Hypertension, unspecified type   3. Hypothyroidism, unspecified type   4. Lipoma of left axilla   5. Prediabetes   6. Gastroesophageal reflux disease, esophagitis presence not specified     1. Reviewed recent lab work in depth with patient today.  All lab work within normal limits unless otherwise noted.  Extensive education provided.  All questions were answered.  - Reviewed organ health at length with patient today.  Discussed critical importance of controlling blood pressure, controlling blood sugar, hydrating adequately, and engaging in regular physical activity, especially formal exercise, to help preserve and improve organ health.  - Re-check BMP and A1c in 4 months.  2. Prediabetes - A1c = 6.1 - Discussed that prediabetes is 5.7 to 6.4. - Reviewed that diabetic status is indicated by an A1c of 6.5 or more. - Will continue to monitor.  - Counseled patient on prevention of disease and discussed prudent dietary and lifestyle modifications as first line.  Importance of low carb/ketogenic diet discussed with patient in addition to regular exercise.   3. Hypothyroid - Managed on Synthroid through [facility] - labs all WNL (TSH, Free T3, Free T4). - Patient maintained on levothyroxine and another medication she does not know the name of. - Stable at this time.    - Management will be continued through [facility]  4. History of Vitamin D Deficiency - Lab values WNL. - Per patient, takes 4500 every day. - Continue supplementation as prescribed.  5. Hyperlipidemia - HDL = 44. - Discussed recommendations to improve this to 60 or above, through exercise. - LDL = 142.   - Dietary changes such as low saturated & trans fat and low carb/ ketogenic diets discussed with patient.  Encouraged regular exercise and weight loss when appropriate.   -  Educational handouts provided at patient's desire.  - ASCVD RISK Today = 14.4%   - Beginning medication indicated today. - Begin statin therapy on Lipitor. - Begin with half of a 40 mg tablet.  - Advised patient to drink adequate amounts of water, and exercise/move around.  6. Hypertension - Blood pressure elevated on intake today. - Continue on atenolol as established. - Begin blood pressure medication today, losartan.  See med list. - Discussed weaning up to start dose.  - Reviewed that goal blood pressure is under 135/85.  - Lifestyle changes such as dash diet and engaging in a regular exercise program discussed with patient.  Educational handouts provided  - Ambulatory BP monitoring encouraged. Keep log and bring in next OV.  - Re-check kidney function in near future.  7. Lifestyle & Preventative Health Maintenance - Advised patient to continue working toward exercising to improve overall mental, physical, and emotional health.    - Reviewed the "spokes of the wheel" of mood and health management.  Stressed the importance of ongoing prudent habits, including regular exercise, appropriate sleep hygiene, healthful dietary habits, and prayer/meditation to relax.  - Encouraged patient to engage in daily physical activity, especially a formal exercise routine.  Recommended that the patient eventually strive for at least 150 minutes of moderate cardiovascular activity per week according to guidelines established by the [facility].   - Healthy dietary habits encouraged, including low-carb, and high amounts of lean protein in diet.   - American Heart Association guidelines for healthy diet, basically Mediterranean diet, and exercise guidelines of 30 minutes 5 days per week or more discussed  in detail.  - Health counseling performed.  All questions answered.  - Patient should also consume adequate amounts of water.   Education and routine counseling performed. Handouts provided.  Pt was  interviewed and evaluated by me in the clinic today for 32.5+ minutes, with over 50% time spent in face to face counseling of patients various medical conditions, treatment plans of those medical conditions including medicine management and lifestyle modification, strategies to improve health and well being; and in coordination of care. SEE ABOVE TREATMENT PLAN FOR DETAILS   Meds ordered this encounter  Medications  . DISCONTD: omeprazole (PRILOSEC) 20 MG capsule    Sig: Take 1 capsule (20 mg total) by mouth daily.    Dispense:  90 capsule    Refill:  3  . losartan (COZAAR) 50 MG tablet    Sig: Take 1 tablet (50 mg total) by mouth daily.    Dispense:  90 tablet    Refill:  1  . atorvastatin (LIPITOR) 40 MG tablet    Sig: Take 1 tablet (40 mg total) by mouth at bedtime.    Dispense:  90 tablet    Refill:  1  . omeprazole (PRILOSEC) 20 MG capsule    Sig: Take 1 capsule (20 mg total) by mouth daily.    Dispense:  90 capsule    Refill:  3    Medications Discontinued During This Encounter  Medication Reason  . omeprazole (PRILOSEC) 20 MG capsule       Return for 2) started chol and BP med- reck cmp, a1c, FLP at visiit.   Anticipatory guidance and routine counseling done re: condition, txmnt options and need for follow up. All questions of patient's were answered.   Gross side effects, risk and benefits, and alternatives of medications discussed with patient.  Patient is aware that all medications have potential side effects and we are unable to predict every sideeffect or drug-drug interaction that may occur.  Expresses verbal understanding and consents to current therapy plan and treatment regiment.  Please see AVS handed out to patient at the end of our visit for additional patient instructions/ counseling done pertaining to today's office visit.  Note:  This document was prepared using Dragon voice recognition software and may include unintentional dictation errors.  This  document serves as a record of services personally performed by Mellody Dance, DO. It was created on her behalf by [name], a trained medical scribe. The creation of this record is based on the scribe's personal observations and the provider's statements to them.   I have reviewed the above medical documentation for accuracy and completeness and I concur.  Mellody Dance, DO 03/13/2018 8:53 PM      ----------------------------------------------------------------------------------------------------------------------  Subjective:   CC:   Dominique Brock is a 63 y.o. female who presents to [place] at [facility] today for review and discussion of recent bloodwork that was done in addition to f/up on chronic conditions we are managing for pt.  1. All recent blood work that we ordered was reviewed with patient today.  Patient was counseled on all abnormalities and we discussed dietary and lifestyle changes that could help those values (also medications when appropriate).  Extensive health counseling performed and all patient's concerns/ questions were addressed.  See labs below and also plan for more details of these abnormalities  Works for [employer] in [employer].  Got a treadmill in and started walking last week.  Notes she's "working it  up" because she's started at walking 30 minutes, every other day.  Notes she's walking over a mile each time.  Patient states she has never been a diabetic before, and her A1c has always been below 6.0.  1. HTN HPI:  -  Her blood pressure has not been controlled at home.  Pt is checking it at home.   - Has never been managed on medicine in the past.  Continues on atenolol.  - Denies medication S-E.   - Smoking Status noted; never smoker.  - She denies new onset of: chest pain, exercise intolerance, shortness of breath, dizziness, visual changes, headache, lower extremity swelling or claudication.    Last 3 blood pressure readings in our office are as follows: BP Readings from Last 3 Encounters:  03/13/18 (!) 150/81  01/15/18 128/86  06/06/17 124/78    Filed Weights   03/13/18 1013  Weight: 153 lb 3.2 oz (69.5 kg)     Wt Readings from Last 3 Encounters:  03/13/18 153 lb 3.2 oz (69.5 kg)  01/15/18 148 lb 14.4 oz (67.5 kg)  06/06/17 150 lb 9.6 oz (68.3 kg)   BP Readings from Last 3 Encounters:  03/13/18 (!) 150/81  01/15/18 128/86  06/06/17 124/78   Pulse Readings from Last 3 Encounters:  03/13/18 60  01/15/18 (!) 56  06/06/17 62   BMI Readings from Last 3 Encounters:  03/13/18 26.30 kg/m  01/15/18 25.56 kg/m  06/06/17 25.85 kg/m     Patient Care Team    Relationship Specialty Notifications Start End  Mellody Dance, DO PCP - General Family Medicine  01/15/18   Bobbitt, Sedalia Muta, MD Consulting Physician Allergy and Immunology  01/15/18   [name], MD Consulting Physician Endocrinology  01/15/18   Maisie Fus, MD Consulting Physician Obstetrics and Gynecology  01/15/18     Full medical history updated and reviewed in the office today  Patient Active Problem List   Diagnosis Date Noted  . HTN (hypertension) 01/15/2018    Priority: High  . HLD (hyperlipidemia) 01/15/2018    Priority: High  . Prediabetes 02/05/2016    Priority: High  . Gastroesophageal reflux disease 01/15/2018    Priority: Medium  . Hypothyroidism 02/05/2016    Priority: Medium  . Moderate persistent asthma 02/17/2007    Priority: Medium  . Obstructive sleep apnea, hx of 02/17/2007    Priority: Medium  . Chronic sinusitis 09/19/2016    Priority: Low  . Environmental allergies 06/15/2016    Priority: Low  . Anemia 06/15/2016    Priority: Low  . 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 Perennial allergic rhinitis 09/19/2016  . Rhinitis medicamentosa 09/19/2016  . 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 Eczema 06/15/2016  . EPISTAXIS, RECURRENT 03/24/2009   . BRONCHITIS, ACUTE 10/31/2008  . Sinusitis, chronic 02/29/2008  . ABDOMINAL PAIN, RIGHT UPPER QUADRANT 06/29/2007  . LIPOMA OF UNSPECIFIED SITE 06/12/2007  . SYNCOPE, VASOVAGAL 06/12/2007    Past Medical History:  Diagnosis Date  . Asthma   . Diabetes mellitus   . Eczema 06/15/2016  . Hypertension   . Sleep apnea   . Thyroid disease     Past Surgical History:  Procedure Laterality Date  . ABDOMINAL HYSTERECTOMY    . BILATERAL OOPHORECTOMY    . SINUS EXPLORATION  1996    Social History   Tobacco Use  . Smoking status: Never Smoker  . Smokeless tobacco: Never Used  Substance Use Topics  . Alcohol use: Yes  Comment: less than once a month    Family Hx: Family History  Problem Relation Age of Onset  . Hypertension Mother   . Hyperlipidemia Mother   . Liver cancer Mother 105       died in 05-15-2011, at age 26 or 55.  . Allergies Mother   . Diabetes Mother 61       onset "mid fifties."  . Healthy Father   . Dementia Father 57       not officialy diagnosed  . Healthy Other        2 siblings  . Asthma Maternal Grandmother   . Diabetes Maternal Grandmother   . Asthma Cousin   . Allergic rhinitis Neg Hx   . Angioedema Neg Hx   . Eczema Neg Hx   . Immunodeficiency Neg Hx   . Urticaria Neg Hx      Medications: Current Outpatient Medications  Medication Sig Dispense Refill  . atenolol (TENORMIN) 25 MG tablet Take 1 tablet (25 mg total) by mouth daily. 90 tablet 3  . Azelastine-Fluticasone 137-50 MCG/ACT SUSP Place into the nose daily.    Marland Kitchen levothyroxine (SYNTHROID, LEVOTHROID) 100 MCG tablet TAKE 1 TABLET BY MOUTH ONCE DAILY ON AN EMPTY STOMACH IN THE MORNING  2  . liothyronine (CYTOMEL) 5 MCG tablet Take 1 tablet by mouth daily.    Marland Kitchen omeprazole (PRILOSEC) 20 MG capsule Take 20 mg by mouth daily. OTC    . atorvastatin (LIPITOR) 40 MG tablet Take 1 tablet (40 mg total) by mouth at bedtime. 90 tablet 1  . losartan (COZAAR) 50 MG tablet Take 1 tablet (50 mg total) by  mouth daily. 90 tablet 1  . omeprazole (PRILOSEC) 20 MG capsule Take 1 capsule (20 mg total) by mouth daily. 90 capsule 3   No current facility-administered medications for this visit.     Allergies:  Allergies  Allergen Reactions  . Azithromycin Rash    REACTION: rash  . Codeine Nausea And Vomiting  . Penicillins Rash  . Food     shellfish     Review of Systems: General:   No F/C, wt loss Pulm:   No DIB, SOB, pleuritic chest pain Card:  No CP, palpitations Abd:  No n/v/d or pain Ext:  No inc edema from baseline  Objective:  Blood pressure (!) 150/81, pulse 60, temperature 97.7 F (36.5 C), height 5\' 4"  (1.626 m), weight 153 lb 3.2 oz (69.5 kg), SpO2 99 %. Body mass index is 26.3 kg/m. Gen:   Well NAD, A and O *3 HEENT:    [place]/AT, EOMI,  MMM Lungs:   Normal work of breathing. CTA B/L, no Wh, rhonchi Heart:   RRR, S1, S2 WNL's, no MRG Abd:   No gross distention Exts:    warm, pink,  Brisk capillary refill, warm and well perfused.  Psych:    No HI/SI, judgement and insight good, Euthymic mood. Full Affect.   No results found for this or any previous visit (from the past May 15, 2158 hour(s)).

## 2018-03-15 ENCOUNTER — Other Ambulatory Visit: Payer: Self-pay

## 2018-03-15 DIAGNOSIS — I10 Essential (primary) hypertension: Secondary | ICD-10-CM

## 2018-03-15 DIAGNOSIS — R7303 Prediabetes: Secondary | ICD-10-CM

## 2018-03-15 DIAGNOSIS — E785 Hyperlipidemia, unspecified: Secondary | ICD-10-CM

## 2018-07-04 ENCOUNTER — Encounter: Payer: Self-pay | Admitting: Family Medicine

## 2018-07-04 ENCOUNTER — Other Ambulatory Visit: Payer: Self-pay

## 2018-07-04 ENCOUNTER — Ambulatory Visit (INDEPENDENT_AMBULATORY_CARE_PROVIDER_SITE_OTHER): Payer: 59 | Admitting: Family Medicine

## 2018-07-04 VITALS — BP 142/77 | Ht 64.0 in

## 2018-07-04 DIAGNOSIS — I10 Essential (primary) hypertension: Secondary | ICD-10-CM

## 2018-07-04 DIAGNOSIS — J3089 Other allergic rhinitis: Secondary | ICD-10-CM | POA: Diagnosis not present

## 2018-07-04 DIAGNOSIS — R7303 Prediabetes: Secondary | ICD-10-CM

## 2018-07-04 DIAGNOSIS — E785 Hyperlipidemia, unspecified: Secondary | ICD-10-CM | POA: Diagnosis not present

## 2018-07-04 MED ORDER — AZELASTINE-FLUTICASONE 137-50 MCG/ACT NA SUSP: NASAL | 5 refills | Status: DC

## 2018-07-04 NOTE — Progress Notes (Signed)
Telehealth office visit note for Dominique Brock, D.O- at Primary Care at [facility]   I connected with current patient today and verified that I am speaking with the correct person using two identifiers.   . Location of the patient: Home . Location of the provider: Office Only the patient (+/- their family members at pt's discretion) and myself were participating in the encounter    - This visit type was conducted due to national recommendations for restrictions regarding the COVID-19 Pandemic (e.g. social distancing) in an effort to limit this patient's exposure and mitigate transmission in our community.  This format is felt to be most appropriate for this patient at this time.   - The patient did not have access to video technology or had technical difficulties with video requiring transitioning to audio format only. - No physical exam could be performed with this format, beyond that communicated to [place] by the patient/ family members as noted.   - Additionally my office staff/ schedulers discussed with the patient that there may be a monetary charge related to this service, depending on their medical insurance.   The patient expressed understanding, and agreed to proceed.       History of Present Illness:  Last office visit on 03/13/2018.   patient and I reviewed bldwrk and pt was started on:  -Lipitor for hyperlipidemia- now to one tab nightly. No s-e.   -Losartan in addition to her atenolol for her hypertension.  tol well.   Bp at home 130-140/ 70's.  Runs consistently 130's/ upper 70's.  Headaches are tremendously improved. And she has a lot more energy.  Occ feels off balance- she has had for long while pt states it is due to ears being stopped up.    -Prediabetes: Her A1c 4 months ago was 6.1.  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  Work is very stressful.  She does mgt it ok though per patient and denies that she needs medications or help with this..  She is trying to walk w dog.   She sews and "being creative is her happy place."   She trys to do this daily, but sometimes she can't due to the busyness of her schedule.     Impression and Recommendations:    1. Hypertension, unspecified type   2. Hyperlipidemia, unspecified hyperlipidemia type   3. Prediabetes   4. Environmental and seasonal allergies     -Continue BP meds as it seems very well controlled at home.  Continue home monitoring and check blood pressure and heart rate-keep a log and bring in with each office visit -Continue cholesterol meds.  Will need to recheck CMP and FLP as soon as patient is next available to do so.  She was asked to call the office to make a lab only appointment in the very near future. -Prediabetes, last done 4 months ago was 6.1.  We will recheck when patient comes in for labs in the very near future.  Reminded patient of dietary lifestyle modifications to help prevent diabetes -For allergies we discussed lifestyle  modifications and refill of her Dymista was given - As part of my medical decision making, I reviewed the following data within the [place] History obtained from pt /family, CMA notes reviewed and incorporated if applicable, Labs reviewed, Radiograph/ tests reviewed if applicable and OV notes from prior OV's with me, as well as other specialists she/he has seen since seeing me last, were all reviewed and used in my medical decision making process today.   - Additionally, discussion had with patient regarding txmnt plan, and their biases/concerns about that plan were used in my medical decision making today.   - The patient agreed  with the plan and demonstrated an understanding of the instructions.   No barriers to understanding were identified.   - Red flag symptoms and signs discussed in detail.  Patient expressed understanding regarding what to do in case of emergency\ urgent symptoms.  The patient was advised to call back or seek an in-person evaluation if the symptoms worsen or if the condition fails to improve as anticipated.   Return for [facility] appt next week; OV BP, Chol, Pre-DM 4-42mo depending on labwrk results..    No orders of the defined types were placed in this encounter.   Meds ordered this encounter  Medications  . Azelastine-Fluticasone 137-50 MCG/ACT SUSP    Sig: 1 spray each nostril twice daily following sinus rinse    Dispense:  1 Bottle    Refill:  5    Medications Discontinued During This Encounter  Medication Reason  . omeprazole (PRILOSEC) 20 MG capsule   . Azelastine-Fluticasone 137-50 MCG/ACT SUSP Reorder      I provided 22 minutes of non-face-to-face time during this encounter,with over 50% of the time in direct counseling on patients medical conditions/ medical concerns.  Additional time was spent with charting and coordination of care after the actual visit commenced.   Note:  This note was prepared with assistance of Dragon voice recognition software. Occasional wrong-word or sound-a-like substitutions may have occurred due to the inherent limitations of voice recognition software.  Dominique Dance, DO     Patient Care Team    Relationship Specialty Notifications Start End  Dominique Dance, DO PCP - General Family Medicine  01/15/18   Bobbitt, [place] Muta, MD Consulting Physician Allergy and Immunology  01/15/18   [name], MD Consulting Physician Endocrinology  01/15/18   Maisie Fus, MD Consulting Physician Obstetrics and Gynecology  01/15/18      -Vitals obtained; medications/ allergies reconciled;  personal medical, social, Sx etc.histories were updated by  CMA, reviewed by me and are reflected in chart   Patient Active Problem List   Diagnosis Date Noted  . HTN (hypertension) 01/15/2018    Priority: High  . HLD (hyperlipidemia) 01/15/2018    Priority: High  . Prediabetes 02/05/2016    Priority: High  . Gastroesophageal reflux disease 01/15/2018    Priority: Medium  . Hypothyroidism 02/05/2016    Priority: Medium  . Moderate persistent asthma 02/17/2007    Priority: Medium  . Obstructive sleep apnea, hx of 02/17/2007    Priority: Medium  . Chronic sinusitis 09/19/2016    Priority: Low  . Environmental and seasonal allergies 06/15/2016    Priority: Low  . Anemia 06/15/2016    Priority: Low  . 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 Perennial allergic rhinitis 09/19/2016  . Rhinitis medicamentosa 09/19/2016  . 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 Eczema 06/15/2016  . EPISTAXIS, RECURRENT 03/24/2009  .  BRONCHITIS, ACUTE 10/31/2008  . Sinusitis, chronic 02/29/2008  . ABDOMINAL PAIN, RIGHT UPPER QUADRANT 06/29/2007  . LIPOMA OF UNSPECIFIED SITE 06/12/2007  . SYNCOPE, VASOVAGAL 06/12/2007     Current Meds  Medication Sig  . atenolol (TENORMIN) 25 MG tablet Take 1 tablet (25 mg total) by mouth daily.  Marland Kitchen atorvastatin (LIPITOR) 40 MG tablet Take 1 tablet (40 mg total) by mouth at bedtime.  . Azelastine-Fluticasone 137-50 MCG/ACT SUSP 1 spray each nostril twice daily following sinus rinse  . levothyroxine (SYNTHROID, LEVOTHROID) 100 MCG tablet TAKE 1 TABLET BY MOUTH ONCE DAILY ON AN EMPTY STOMACH IN THE MORNING  . liothyronine (CYTOMEL) 5 MCG tablet Take 1 tablet by mouth daily.  Marland Kitchen losartan (COZAAR) 50 MG tablet Take 1 tablet (50 mg total) by mouth daily.  Marland Kitchen omeprazole (PRILOSEC) 20 MG capsule Take 1 capsule (20 mg total) by mouth daily.  . [DISCONTINUED] Azelastine-Fluticasone 137-50 MCG/ACT SUSP Place into the nose daily.     Allergies:  Allergies  Allergen Reactions  . Azithromycin Rash    REACTION: rash  .  Codeine Nausea And Vomiting  . Penicillins Rash  . Food     shellfish     ROS:  See above HPI for pertinent positives and negatives   Objective:   Blood pressure (!) 142/77, height 5\' 4"  (1.626 m).  (if some vitals are omitted, this means that patient was UNABLE to obtain them even though they were asked to get them prior to OV today.  They were asked to call us at their earliest convenience with these once obtained. )  General: A & O * 3; sounds in no acute distress; in usual state of health.  Skin: Pt confirms warm and dry extremities and pink fingertips HEENT: Pt confirms lips non-cyanotic Chest: Patient confirms normal chest excursion and movement Respiratory: speaking in full sentences, no conversational dyspnea; patient confirms no use of accessory muscles Psych: insight appears good, mood- appears full

## 2018-07-17 ENCOUNTER — Other Ambulatory Visit: Payer: 59

## 2018-09-06 ENCOUNTER — Other Ambulatory Visit: Payer: 59

## 2018-09-13 ENCOUNTER — Other Ambulatory Visit: Payer: Self-pay

## 2018-09-13 ENCOUNTER — Other Ambulatory Visit (INDEPENDENT_AMBULATORY_CARE_PROVIDER_SITE_OTHER): Payer: 59

## 2018-09-13 DIAGNOSIS — R7303 Prediabetes: Secondary | ICD-10-CM

## 2018-09-13 DIAGNOSIS — E785 Hyperlipidemia, unspecified: Secondary | ICD-10-CM

## 2018-09-13 DIAGNOSIS — I10 Essential (primary) hypertension: Secondary | ICD-10-CM

## 2018-09-14 LAB — COMPREHENSIVE METABOLIC PANEL
ALT: 35 IU/L — ABNORMAL HIGH (ref 0–32)
AST: 20 IU/L (ref 0–40)
Albumin/Globulin Ratio: 2.2 (ref 1.2–2.2)
Albumin: 4.2 g/dL (ref 3.8–4.8)
Alkaline Phosphatase: 69 IU/L (ref 39–117)
BUN/Creatinine Ratio: 19 (ref 12–28)
BUN: 17 mg/dL (ref 8–27)
Bilirubin Total: 0.5 mg/dL (ref 0.0–1.2)
CO2: 24 mmol/L (ref 20–29)
Calcium: 9.5 mg/dL (ref 8.7–10.3)
Chloride: 104 mmol/L (ref 96–106)
Creatinine, Ser: 0.89 mg/dL (ref 0.57–1.00)
GFR calc Af Amer: 81 mL/min/{1.73_m2} (ref >59)
GFR calc non Af Amer: 70 mL/min/{1.73_m2} (ref >59)
Globulin, Total: 1.9 g/dL (ref 1.5–4.5)
Glucose: 93 mg/dL (ref 65–99)
Potassium: 4.8 mmol/L (ref 3.5–5.2)
Sodium: 141 mmol/L (ref 134–144)
Total Protein: 6.1 g/dL (ref 6.0–8.5)

## 2018-09-14 LAB — LIPID PANEL
Chol/HDL Ratio: 2.9 ratio (ref 0.0–4.4)
Cholesterol, Total: 113 mg/dL (ref 100–199)
HDL: 39 mg/dL — ABNORMAL LOW (ref >39)
LDL Calculated: 62 mg/dL (ref 0–99)
Triglycerides: 61 mg/dL (ref 0–149)
VLDL Cholesterol Cal: 12 mg/dL (ref 5–40)

## 2018-09-14 LAB — HEMOGLOBIN A1C
Est. average glucose Bld gHb Est-mCnc: 134 mg/dL
Hgb A1c MFr Bld: 6.3 % — ABNORMAL HIGH (ref 4.8–5.6)

## 2018-09-20 ENCOUNTER — Other Ambulatory Visit: Payer: Self-pay

## 2018-09-20 DIAGNOSIS — R7303 Prediabetes: Secondary | ICD-10-CM

## 2018-09-24 ENCOUNTER — Other Ambulatory Visit: Payer: Self-pay | Admitting: Family Medicine

## 2018-09-24 DIAGNOSIS — I10 Essential (primary) hypertension: Secondary | ICD-10-CM

## 2018-09-27 ENCOUNTER — Other Ambulatory Visit: Payer: Self-pay | Admitting: Family Medicine

## 2018-09-27 DIAGNOSIS — E785 Hyperlipidemia, unspecified: Secondary | ICD-10-CM

## 2018-10-04 ENCOUNTER — Other Ambulatory Visit: Payer: Self-pay | Admitting: Family Medicine

## 2018-10-04 MED ORDER — ATENOLOL 25 MG PO TABS: 25.0000 mg | ORAL_TABLET | Freq: Every day | ORAL | 0 refills | Status: DC

## 2018-10-04 NOTE — Telephone Encounter (Signed)
Sorry I cannot modify the medication as it was not sent to me in a refill request.    Only give her #90 no refills of the medicine, please write further refills by thyroid specialist as a note to the pharmacy.    - this is a medicine that has been given to her by her endocrinologist Dr. Darnell Level in the past and she currently is seeing [facility] for management of her thyroid condition.    She will need to get further refills from them in the future since they are managing the condition which this beta-blocker is used to treat

## 2018-10-04 NOTE — Telephone Encounter (Signed)
Alenolol never prescribed by Dr Raliegh Scarlet. Last visit was in May.   Return for FB[facility] appt next week; OV BP, Chol, Pre-DM 4-24mo depending on labwrk results.Marland Kitchen

## 2018-10-04 NOTE — Telephone Encounter (Signed)
Patient called states she would now like Dr. Jenetta Downer to take over management of this Rx :   atenolol (TENORMIN) 25 MG tablet [[id number]]   Order Details Dose: 25 mg Route: Oral Frequency: Daily  Dispense Quantity: 90 tablet Refills: 3 Fills remaining: --        Sig: Take 1 tablet (25 mg total) by mouth daily.     ---forwarding request to send refill order to :   [facility] ([address]), [place] - [place] [phone] (Phone) [phone] (Fax   --glh

## 2018-11-16 ENCOUNTER — Telehealth: Payer: Self-pay | Admitting: Family Medicine

## 2018-11-16 NOTE — Telephone Encounter (Signed)
Patient called states since Monday has pain(  along mid to low Left side back) feels slightly warm & radiating downward-- Pt unsure of cause & request medical asst/ provider call her back w/advice @ [phone]   --Forwarding message to medical asst.  -glh

## 2018-11-16 NOTE — Telephone Encounter (Signed)
Spoke with patient regarding her back pain.  She describes it as low back between buttock radiating towards side.  She denies injury and has problem since Monday.  Patient denies urinary issues. Advised patient to try heat, ice, ibuprofen and tylenol alternating.  Recommended that if problem worsens or does not get better with these interventions to go to urgent care or contact the office Monday for an evaluation.  Patient agreeable.

## 2018-12-24 ENCOUNTER — Other Ambulatory Visit: Payer: 59

## 2018-12-26 ENCOUNTER — Other Ambulatory Visit: Payer: Self-pay

## 2018-12-26 ENCOUNTER — Encounter: Payer: Self-pay | Admitting: Family Medicine

## 2018-12-26 ENCOUNTER — Ambulatory Visit (INDEPENDENT_AMBULATORY_CARE_PROVIDER_SITE_OTHER): Payer: 59 | Admitting: Family Medicine

## 2018-12-26 VITALS — BP 134/86 | HR 68 | Ht 65.0 in | Wt 149.2 lb

## 2018-12-26 DIAGNOSIS — K219 Gastro-esophageal reflux disease without esophagitis: Secondary | ICD-10-CM

## 2018-12-26 DIAGNOSIS — E785 Hyperlipidemia, unspecified: Secondary | ICD-10-CM | POA: Diagnosis not present

## 2018-12-26 DIAGNOSIS — R7303 Prediabetes: Secondary | ICD-10-CM | POA: Diagnosis not present

## 2018-12-26 DIAGNOSIS — E039 Hypothyroidism, unspecified: Secondary | ICD-10-CM | POA: Diagnosis not present

## 2018-12-26 DIAGNOSIS — J3089 Other allergic rhinitis: Secondary | ICD-10-CM

## 2018-12-26 DIAGNOSIS — I10 Essential (primary) hypertension: Secondary | ICD-10-CM

## 2018-12-26 MED ORDER — LOSARTAN POTASSIUM 50 MG PO TABS: 50.0000 mg | ORAL_TABLET | Freq: Every day | ORAL | 1 refills | Status: DC

## 2018-12-26 MED ORDER — ATENOLOL 25 MG PO TABS: 25.0000 mg | ORAL_TABLET | Freq: Every day | ORAL | 1 refills | Status: DC

## 2018-12-26 MED ORDER — OMEPRAZOLE 20 MG PO CPDR: 20.0000 mg | DELAYED_RELEASE_CAPSULE | Freq: Every day | ORAL | 3 refills | Status: DC

## 2018-12-26 MED ORDER — ATORVASTATIN CALCIUM 40 MG PO TABS: 40.0000 mg | ORAL_TABLET | Freq: Every day | ORAL | 1 refills | Status: DC

## 2018-12-26 NOTE — Progress Notes (Signed)
Virtual / live video office visit note for [employer], D.O- Primary Care Physician at [facility]   I connected with current patient today and beyond visually recognizing the correct individual, I verified that I am speaking with the correct person using two identifiers.  . Location of the patient: Home . Location of the provider: Office Only the patient (+/- their family members at pt's discretion) and myself were participating in the encounter    - This visit type was conducted due to national recommendations for restrictions regarding the COVID-19 Pandemic (e.g. social distancing) in an effort to limit this patient's exposure and mitigate transmission in our community.  This format is felt to be most appropriate for this patient at this time.   - The patient did have access to video technology today  - No physical exam could be performed with this format, beyond that communicated to [place] by the patient/ family members as noted.   - Additionally my office staff/ schedulers discussed with the patient that there may be a monetary charge related to this service, depending on patient's medical insurance.   The patient expressed understanding, and agreed to proceed.      History of Present Illness:  I, Dominique Brock, am serving as scribe for Dr. Mellody Dance.  Notes doing well today.   - Endocrinology Continues to obtain "hormone therapy and thyroid stuff" from [facility] per pt.  Thinks her hormones and thyroid issues are in check now.  Says "once you get the hormones on the right track, it seems to make everything normal again."   -- She is not currently seeing Dr. Cruzita Lederer.  Says her issues were being caused by both her hormones and thyroid, and notes Dr. Cruzita Lederer suggested patient to go somewhere like [facility] to manage everything.  Per patient, [facility] "takes labs before they do anything."   - Female Health-  Dr Dory Horn Had a pap smear done which "had some irregular  something going on," and she just did another one; patient is waiting on this result.  Says "that's been a little stressful, but they told me not to worry about it."  Notes she has been getting pap smears every year.  Told pt to get [place] med records.   - Prediabetes Denies history of diabetes.  Says "I'm trying [to make dietary and lifestyle modifications], but it seems like I just keep gaining weight."  Notes exercising and walking, "maybe not every day, but I try to make a conscious effort to do it, but it doesn't seem like it's moving the scale anywhere."  Says that the last couple of weeks, she's been stressed a couple of days; "the election didn't help."  Notes started watching [employer] movies and "keep everything out that stresses me out," and starting to relax now.  Says "work's getting a little better but it's honestly not going to be much better until the fourth quarter of next year."   - GERD Continues omeprazole 20 mg.  Feels it is well-controlled with this.   - Environmental & Seasonal Allergies Says her allergies are terrible right now.  "I'm trying to rinse, but they are terrible."   Hypertension:  Notes in the past, had her atenolol provided by a company nurse.  Hasn't been checking her blood pressure at home.  Ntoes "last time I went in to [facility], it was fabulous."  Notes she was rushing to get her BP cuff and take a  measurement this morning.  Gets up at 5 AM to take the levothyroxine and then takes everything else at 9 AM.  - Patient reports good compliance with medication and/or lifestyle modification  - Her denies acute concerns or problems related to treatment plan  - She denies new onset of: chest pain, exercise intolerance, shortness of breath, dizziness, visual changes, headache, lower extremity swelling or claudication.   Last 3 blood pressure readings in our office are as follows: BP Readings from Last 3 Encounters:  12/26/18 134/86  07/04/18 (!)  142/77  03/13/18 (!) 150/81   Filed Weights   12/26/18 0815  Weight: 149 lb 3.2 oz (67.7 kg)     Hyperlipidemia:  63 y.o. female here for cholesterol follow-up.   - Patient reports good compliance with treatment plan of:  medication and/ or lifestyle management.    - Patient denies any acute concerns or problems with management plan   - She denies new onset of: myalgias, arthralgias, increased fatigue more than normal, chest pains, exercise intolerance, shortness of breath, dizziness, visual changes, headache, lower extremity swelling or claudication.   Most recent cholesterol panel was:  Lab Results  Component Value Date   CHOL 113 09/13/2018   HDL 39 (L) 09/13/2018   LDLCALC 62 09/13/2018   TRIG 61 09/13/2018   CHOLHDL 2.9 09/13/2018   Hepatic Function Latest Ref Rng & Units 09/13/2018 03/02/2018 12/06/2016  Total Protein 6.0 - 8.5 g/dL 6.1 - 6.0(L)  Albumin 3.8 - 4.8 g/dL 4.2 - -  AST 0 - 40 IU/L _0 ALT 0 - 32 IU/L 35(H) 20 16  Alk Phosphatase 39 - 117 IU/L 69 67 -  Total Bilirubin 0.0 - 1.2 mg/dL 0.5 - 0.3  Bilirubin, Direct 0.0 - 0.3 mg/dL - - -      Depression screen [facility] 2/9 12/26/2018 07/04/2018 03/13/2018 01/15/2018  Decreased Interest 1 0 0 3  Down, Depressed, Hopeless 0 0 0 2  PHQ - 2 Score 1 0 0 5  Altered sleeping 0 0 0 2  Tired, decreased energy 3 0 0 1  Change in appetite 0 1 0 2  Feeling bad or failure about yourself  0 0 0 2  Trouble concentrating 0 0 0 1  Moving slowly or fidgety/restless 0 0 0 0  Suicidal thoughts 0 0 0 0  PHQ-9 Score 4 1 0 13  Difficult doing work/chores Not difficult at all - Not difficult at all Somewhat difficult    GAD 7 : Generalized Anxiety Score 12/26/2018 07/04/2018  Nervous, Anxious, on Edge 0 1  Control/stop worrying 0 0  Worry too much - different things 0 0  Trouble relaxing 0 1  Restless 0 0  Easily annoyed or irritable 0 0  Afraid - awful might happen 0 0  Total GAD 7 Score 0 2  Anxiety Difficulty - Not  difficult at all     Impression and Recommendations:    1. Hypertension, unspecified type   2. Hyperlipidemia, unspecified hyperlipidemia type   3. Prediabetes   4. Hypothyroidism, unspecified type   5. Gastroesophageal reflux disease, unspecified whether esophagitis present   6. Environmental and seasonal allergies   7. Gastroesophageal reflux disease without esophagitis     Prediabetes - Last A1c 6.3 in July 2020. - Discussed that patient may obtain A1c kits over the counter if desired. - Patient wishes to return to clinic for A1c in near future as advised.  - Counseled patient on prevention of diabetes  and discussed various treatment options, which always includes dietary and lifestyle modification as first line.    - Importance of low carb, heart-healthy diet discussed with patient in addition to regular aerobic exercise of 31mn 5d/week or more.   - We will continue to monitor  Hypertension - Stable at this time. - Continue management as prescribed.  - Wishes to switch atenolol refills/management to clinic here. - Reviewed goal of 135/85 or less on a regular basis.  - Counseled patient on pathophysiology of disease and discussed various treatment options, which always includes dietary and lifestyle modification as first line.   - Lifestyle changes such as dash and heart healthy diets and engaging in a regular exercise program discussed extensively with patient.   - Ambulatory blood pressure monitoring encouraged at least 3 times weekly.  Sit down for 5-10, up to 20 minutes prior to checking BP at home.  Keep BP log and bring in every office visit.  Reminded patient that if they ever feel poorly in any way, to check their blood pressure and pulse.  - Handouts provided at patient's desire and/or told to go online at the [place] for further information  - We will continue to monitor  GERD - Stable at this time. - Controlled on current  omeprazole management.  - Encouraged patient to reduce her use of omeprazole.  Encouraged dietary modifications/avoiding triggers to control GERD instead of chronic omeprazole management.  - Will continue to monitor.  Hypothyroidism - Stable at this time. - [facility] - No changes made to management today. - Continue treatment plan as established.  Hyperlipidemia - Continue management as prescribed. - No changes made to treatment plan today.  - Will continue to monitor and re-check as recommended.  Seasonal Allergies - Stable at this time - No changes made to management today. - Will continue to monitor.  Stress Management - Discussed signs and symptoms of unmanaged stress with patient today, including weight gain.  - Reviewed the "spokes of the wheel" of mood and health management.  Stressed the importance of ongoing prudent habits, including regular exercise, appropriate sleep hygiene, healthful dietary habits, and prayer/meditation to relax.  - Will continue to monitor.  Health Counseling & Preventative Health Maintenance - Advised patient to continue working toward exercising to improve overall mental, physical, and emotional health.    - Encouraged patient to engage in daily physical activity, especially a formal exercise routine.  Recommended that the patient eventually strive for at least 150 minutes of moderate cardiovascular activity per week according to guidelines established by the [facility]   - Healthy dietary habits encouraged, including low-carb, and high amounts of lean protein in diet.   - Patient should also consume adequate amounts of water.  - Health counseling performed.  All questions answered.  Recommendations - Return in 3-4 months for follow-up with A1c. - Due in near future for CPE and full fasting lab work.   - As part of my medical decision making, I reviewed the following data within the e[place] obtained from  pt /family, CMA notes reviewed and incorporated if applicable, Labs reviewed, Radiograph/ tests reviewed if applicable and OV notes from prior OV's with me, as well as other specialists she/he has seen since seeing me last, were all reviewed and used in my medical decision making process today.   - Additionally, discussion had with patient regarding txmnt plan, their biases about that plan etc were used in my  medical decision making today.   - The patient agreed with the plan and demonstrated an understanding of the instructions.   No barriers to understanding were identified.    Return for CPE near future c FBW + in 3-4 mo for follow-up with A1c/chronic med prob..     Meds ordered this encounter  Medications  . atenolol (TENORMIN) 25 MG tablet    Sig: Take 1 tablet (25 mg total) by mouth daily.    Dispense:  90 tablet    Refill:  1  . omeprazole (PRILOSEC) 20 MG capsule    Sig: Take 1 capsule (20 mg total) by mouth daily.    Dispense:  90 capsule    Refill:  3  . atorvastatin (LIPITOR) 40 MG tablet    Sig: Take 1 tablet (40 mg total) by mouth at bedtime.    Dispense:  90 tablet    Refill:  1  . losartan (COZAAR) 50 MG tablet    Sig: Take 1 tablet (50 mg total) by mouth daily.    Dispense:  90 tablet    Refill:  1    Medications Discontinued During This Encounter  Medication Reason  . liothyronine (CYTOMEL) 5 MCG tablet Error  . omeprazole (PRILOSEC) 20 MG capsule Reorder  . losartan (COZAAR) 50 MG tablet Reorder  . atorvastatin (LIPITOR) 40 MG tablet Reorder  . atenolol (TENORMIN) 25 MG tablet Reorder      Note:  This note was prepared with assistance of Dragon voice recognition software. Occasional wrong-word or sound-a-like substitutions may have occurred due to the inherent limitations of voice recognition software.  This document serves as a record of services personally performed by Mellody Dance, DO. It was created on her behalf by Dominique Brock, a trained  medical scribe. The creation of this record is based on the scribe's personal observations and the provider's statements to them.   This case required medical decision making of at least moderate complexity. The above documentation has been reviewed to be accurate and was completed by Marjory Sneddon, D.O.    Patient Care Team    Relationship Specialty Notifications Start End  Mellody Dance, DO PCP - General Family Medicine  01/15/18   Bobbitt, Sed[place] Muta, MD Consulting Physician Allergy and Immunology  01/15/18   [name], MD Consulting Physician Endocrinology  01/15/18   Maisie Fus, MD Consulting Physician Obstetrics and Gynecology  01/15/18     -Vitals obtained; medications/ allergies reconciled;  personal medical, social, Sx etc.histories were updated by CMA, reviewed by me and are reflected in chart  Patient Active Problem List   Diagnosis Date Noted  . HTN (hypertension) 01/15/2018    Priority: High  . HLD (hyperlipidemia) 01/15/2018    Priority: High  . Prediabetes 02/05/2016    Priority: High  . Gastroesophageal reflux disease 01/15/2018    Priority: Medium  . Hypothyroidism 02/05/2016    Priority: Medium  . Moderate persistent asthma 02/17/2007    Priority: Medium  . Obstructive sleep apnea, hx of 02/17/2007    Priority: Medium  . Chronic sinusitis 09/19/2016    Priority: Low  . Environmental and seasonal allergies 06/15/2016    Priority: Low  . Anemia 06/15/2016    Priority: Low  . 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 Perennial allergic rhinitis 09/19/2016  . Rhinitis medicamentosa 09/19/2016  . 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 Eczema 06/15/2016  . EPISTAXIS, RECURRENT 03/24/2009  . BRONCHITIS, ACUTE 10/31/2008  . Sinusitis,  chronic 02/29/2008  . ABDOMINAL PAIN, RIGHT UPPER QUADRANT 06/29/2007  . LIPOMA OF UNSPECIFIED SITE 06/12/2007  . SYNCOPE, VASOVAGAL 06/12/2007     Current Meds  Medication Sig  . atenolol  (TENORMIN) 25 MG tablet Take 1 tablet (25 mg total) by mouth daily.  Marland Kitchen atorvastatin (LIPITOR) 40 MG tablet Take 1 tablet (40 mg total) by mouth at bedtime.  . Azelastine-Fluticasone 137-50 MCG/ACT SUSP 1 spray each nostril twice daily following sinus rinse  . levothyroxine (SYNTHROID, LEVOTHROID) 100 MCG tablet TAKE 1 TABLET BY MOUTH ONCE DAILY ON AN EMPTY STOMACH IN THE MORNING  . losartan (COZAAR) 50 MG tablet Take 1 tablet (50 mg total) by mouth daily.  Marland Kitchen omeprazole (PRILOSEC) 20 MG capsule Take 1 capsule (20 mg total) by mouth daily.  . [DISCONTINUED] atenolol (TENORMIN) 25 MG tablet Take 1 tablet (25 mg total) by mouth daily. ALL FURTHER REFILLS NEED TO COME FROM ENDOCRINOLOGIST!  . [DISCONTINUED] atorvastatin (LIPITOR) 40 MG tablet TAKE 1 TABLET BY MOUTH AT BEDTIME  . [DISCONTINUED] losartan (COZAAR) 50 MG tablet Take 1 tablet by mouth once daily  . [DISCONTINUED] omeprazole (PRILOSEC) 20 MG capsule Take 1 capsule (20 mg total) by mouth daily.  . [DISCONTINUED] progesterone (PROMETRIUM) 100 MG capsule Take 100 mg by mouth daily.       Allergies  Allergen Reactions  . Azithromycin Rash    REACTION: rash  . Codeine Nausea And Vomiting  . Penicillins Rash  . Shellfish Allergy Rash     ROS:  See above HPI for pertinent positives and negatives   Objective:   Blood pressure 134/86, pulse 68, height 5' 5" (1.651 m), weight 149 lb 3.2 oz (67.7 kg).  (if some vitals are omitted, this means that patient was UNABLE to obtain them even though they were asked to get them prior to OV today.  They were asked to call us at their earliest convenience with these once obtained.)  General: A & O * 3; visually in no acute distress; in usual state of health.  Skin: Visible skin appears normal and pt's usual skin color HEENT:  EOMI, head is normocephalic and atraumatic.  Sclera are anicteric. Neck has a good range of motion.  Lips are noncyanotic Chest: normal chest excursion and movement  Respiratory: speaking in full sentences, no conversational dyspnea; no use of accessory muscles Psych: insight good, mood- appears full

## 2018-12-31 ENCOUNTER — Telehealth: Payer: Self-pay

## 2018-12-31 NOTE — Telephone Encounter (Signed)

## 2019-01-01 ENCOUNTER — Ambulatory Visit: Payer: 59 | Admitting: Plastic Surgery

## 2019-01-01 ENCOUNTER — Encounter: Payer: Self-pay | Admitting: Plastic Surgery

## 2019-01-01 ENCOUNTER — Other Ambulatory Visit: Payer: Self-pay

## 2019-01-01 VITALS — BP 143/87 | HR 57 | Temp 98.0°F | Ht 65.0 in | Wt 150.0 lb

## 2019-01-01 DIAGNOSIS — D1722 Benign lipomatous neoplasm of skin and subcutaneous tissue of left arm: Secondary | ICD-10-CM

## 2019-01-01 NOTE — Progress Notes (Signed)
Patient ID: Dominique Brock, female    DOB: [birth year], 63 y.o.   MRN: [id number]   Chief Complaint  Patient presents with  . Skin Problem    The patient is a very nice 63 year old white female here for evaluation of her left arm.  The patient states that for over a year she has noticed a mass of her left arm near her axilla.  It is been getting larger.  It is now getting painful because its creating a lot of tension on her skin.  She is aware that she has a history of lipomas particularly in her breast area.  She has another on her left thigh.  She has not had any surgery on her left arm.  She is right-hand dominant.  She is 5 feet 5 inches tall and weighs 150 pounds.  She is otherwise in good health.  She is not aware of any trauma.  Her pulses are equal bilaterally.   Review of Systems  Constitutional: Negative for activity change and appetite change.  Eyes: Negative.   Respiratory: Negative.  Negative for chest tightness and shortness of breath.   Cardiovascular: Negative for leg swelling.  Gastrointestinal: Negative for abdominal pain.  Endocrine: Negative.   Genitourinary: Negative.   Musculoskeletal: Negative.   Skin: Negative for color change and wound.  Hematological: Negative.   Psychiatric/Behavioral: Negative.     Past Medical History:  Diagnosis Date  . Asthma   . Diabetes mellitus   . Eczema 06/15/2016  . Hypertension   . Sleep apnea   . Thyroid disease     Past Surgical History:  Procedure Laterality Date  . ABDOMINAL HYSTERECTOMY    . BILATERAL OOPHORECTOMY    . SINUS EXPLORATION  1996      Current Outpatient Medications:  .  atenolol (TENORMIN) 25 MG tablet, Take 1 tablet (25 mg total) by mouth daily., Disp: 90 tablet, Rfl: 1 .  atorvastatin (LIPITOR) 40 MG tablet, Take 1 tablet (40 mg total) by mouth at bedtime., Disp: 90 tablet, Rfl: 1 .  Azelastine-Fluticasone 137-50 MCG/ACT SUSP, 1 spray each nostril twice daily following sinus rinse, Disp: 1 Bottle,  Rfl: 5 .  levothyroxine (SYNTHROID, LEVOTHROID) 100 MCG tablet, TAKE 1 TABLET BY MOUTH ONCE DAILY ON AN EMPTY STOMACH IN THE MORNING, Disp: , Rfl: 2 .  losartan (COZAAR) 50 MG tablet, Take 1 tablet (50 mg total) by mouth daily., Disp: 90 tablet, Rfl: 1 .  omeprazole (PRILOSEC) 20 MG capsule, Take 1 capsule (20 mg total) by mouth daily., Disp: 90 capsule, Rfl: 3   Objective:   Vitals:   01/01/19 0807  BP: (!) 143/87  Pulse: (!) 57  Temp: 98 F (36.7 C)  SpO2: 99%    Physical Exam Vitals signs and nursing note reviewed.  Constitutional:      Appearance: Normal appearance.  Eyes:     Extraocular Movements: Extraocular movements intact.  Cardiovascular:     Rate and Rhythm: Normal rate.     Pulses: Normal pulses.  Pulmonary:     Effort: Pulmonary effort is normal.  Abdominal:     General: Abdomen is flat. There is no distension.  Musculoskeletal:        General: Deformity present.       Arms:  Skin:    General: Skin is warm.     Capillary Refill: Capillary refill takes less than 2 seconds.  Neurological:     General: No focal deficit present.  Mental Status: She is alert and oriented to person, place, and time.  Psychiatric:        Mood and Affect: Mood normal.        Behavior: Behavior normal.        Thought Content: Thought content normal.     Assessment & Plan:  Lipoma of left axilla  Recommend excision of left arm mass / lipoma.  Due to the tightness I recommend ultrasound prior to surgery to be sure that we are dealing with fatty tumor and not something with malignant tendencies.  Patient is in agreement. Pictures were obtained of the patient and placed in the chart with the patient's or guardian's permission.   [place], DO

## 2019-01-04 NOTE — Addendum Note (Signed)
Addended by: Wallace Going on: 01/04/2019 03:18 PM   Modules accepted: Orders

## 2019-01-09 NOTE — Progress Notes (Signed)
C.C.: per-operative examination  History of Present Illness: Dominique Brock is a 63 y.o.  female  with a history of left arm mass. She noticed the mass about 1 year ago. Patient states she noticed it after taking a 3 month course of prednisone for allergies. The mass is now becoming uncomfortable. She has a history of multiple lumps on both breasts and in her left axilla. Patient states she has regular mammograms and will sometimes be evaluated at the breast care center.  She presents for preoperative evaluation for upcoming procedure, excision of left arm mass/lipoma, scheduled for 01/23/19, with Dr. Marla Roe. She has an ultrasound of the area scheduled for 01/16/19.   Past medical history is significant for hypertension, pre-diabetes, hyperlipidemia, hypothyroidism, GERD, and seasonal allergies. Previous surgical history includes; abdominal hysterectomy, bilateral oophorectomy, and sinus exploration. Patient denies any adverse reactions to anesthesia. Patient denies any history of stroke or MI. Patient denies any personal or family history of blood clot. Patient does not take any anticoagulants.    Past Medical History: Allergies: Allergies  Allergen Reactions  . Azithromycin Rash    REACTION: rash  . Codeine Nausea And Vomiting  . Penicillins Rash  . Shellfish Allergy Rash    Current Medications:  Current Outpatient Medications:  .  atenolol (TENORMIN) 25 MG tablet, Take 1 tablet (25 mg total) by mouth daily., Disp: 90 tablet, Rfl: 1 .  atorvastatin (LIPITOR) 40 MG tablet, Take 1 tablet (40 mg total) by mouth at bedtime., Disp: 90 tablet, Rfl: 1 .  Azelastine-Fluticasone 137-50 MCG/ACT SUSP, 1 spray each nostril twice daily following sinus rinse, Disp: 1 Bottle, Rfl: 5 .  levothyroxine (SYNTHROID, LEVOTHROID) 100 MCG tablet, TAKE 1 TABLET BY MOUTH ONCE DAILY ON AN EMPTY STOMACH IN THE MORNING, Disp: , Rfl: 2 .  losartan (COZAAR) 50 MG tablet, Take 1 tablet (50 mg total) by mouth daily.,  Disp: 90 tablet, Rfl: 1 .  omeprazole (PRILOSEC) 20 MG capsule, Take 1 capsule (20 mg total) by mouth daily., Disp: 90 capsule, Rfl: 3  Past Medical Problems: Past Medical History:  Diagnosis Date  . Asthma   . Diabetes mellitus   . Eczema 06/15/2016  . Hypertension   . Sleep apnea   . Thyroid disease     Past Surgical History: Past Surgical History:  Procedure Laterality Date  . ABDOMINAL HYSTERECTOMY    . BILATERAL OOPHORECTOMY    . SINUS EXPLORATION  1996    The patient HAS had anesthesia or sedation in the past.   The patient has NOT had problems with anesthesia.  The patient does NOT have a family history of anesthesia problems.    Social History: Social History   Socioeconomic History  . Marital status: Married    Spouse name: Not on file  . Number of children: 2  . Years of education: Not on file  . Highest education level: Not on file  Occupational History  . Not on file  Social Needs  . Financial resource strain: Not on file  . Food insecurity    Worry: Not on file    Inability: Not on file  . Transportation needs    Medical: Not on file    Non-medical: Not on file  Tobacco Use  . Smoking status: Never Smoker  . Smokeless tobacco: Never Used  Substance and Sexual Activity  . Alcohol use: Yes    Comment: less than once a month  . Drug use: No  . Sexual activity: Yes  Lifestyle  .  Physical activity    Days per week: Not on file    Minutes per session: Not on file  . Stress: Not on file  Relationships  . Social Herbalist on phone: Not on file    Gets together: Not on file    Attends religious service: Not on file    Active member of club or organization: Not on file    Attends meetings of clubs or organizations: Not on file    Relationship status: Not on file  . Intimate partner violence    Fear of current or ex partner: Not on file    Emotionally abused: Not on file    Physically abused: Not on file    Forced sexual activity: Not  on file  Other Topics Concern  . Not on file  Social History Narrative  . Not on file    Family History: Family History  Problem Relation Age of Onset  . Hypertension Mother   . Hyperlipidemia Mother   . Liver cancer Mother 61       died in 05-16-2011, at age 43 or 34.  . Allergies Mother   . Diabetes Mother 32       onset "mid fifties."  . Healthy Father   . Dementia Father 48       not officialy diagnosed  . Healthy Other        2 siblings  . Asthma Maternal Grandmother   . Diabetes Maternal Grandmother   . Asthma Cousin   . Allergic rhinitis Neg Hx   . Angioedema Neg Hx   . Eczema Neg Hx   . Immunodeficiency Neg Hx   . Urticaria Neg Hx     Review of Systems: General ROS: negative Dermatological ROS: negative Cardiovascular ROS: no chest pain or dyspnea on exertion ENT ROS: negative Gastrointestinal ROS: no abdominal pain, change in bowel habits, or black or bloody stools  Physical Exam: Vital Signs BP (!) 145/85 (BP Location: Left Arm, Patient Position: Sitting, Cuff Size: Normal)   Pulse 72   Temp (!) 96.9 F (36.1 C) (Temporal)   Ht 5\' 5"  (1.651 m)   Wt 152 lb 12.8 oz (69.3 kg)   SpO2 98%   BMI 25.43 kg/m  General: awake, alert, appears stated age, no acute distress HEENT: normocephalic Neck: supple, full ROM Chest: symmetrical rise and fall Cardiac: regular rate and rhythm, +2 radial pulses bilaterally, +2 pedal pulses bilaterally Lungs: CTA throughout Abdomen: soft, non-distended, non-tender Musculoskeletal: MAE x4 Neuro: A&Ox3 Extremities: 11 cm x 10 cm mass on anterior left upper arm Skin: no skin lesions or abnormalities  Assessment: 63 y.o.female  with a history of left arm mass. 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 Obtaining ultrasound prior to surgery date.   Caprini score=3-4, patient will require mechanical prophylaxis during surgery   Plan: Patient scheduled for excision of left arm mass/lipoma on 01/23/19,  with Dr. Marla Roe.   Ultrasound scheduled for 01/16/19. COVID-19 pre-procedure test scheduled for 01/19/19. Patient understands she must quarantine after her COVID screening until the day of surgery. Post-op visit scheduled for 02/01/19. Will order post-op medications after reviewing ultrasound results.   Risks, benefits, and alternatives of procedure discussed, expected recovery, and questions answered.  The risks that can be encountered with and after excision of an arm mass were discussed and include the following but not limited to these: bleeding, infection, delayed healing, anesthesia risks, skin sensation changes, injury to structures including  nerves, blood vessels, and muscles which may be temporary or permanent, allergies to tape, suture materials and glues, blood products, topical preparations or injected agents, skin contour irregularities, skin discoloration and swelling, deep vein thrombosis, cardiac and pulmonary complications, pain, which may persist, persistent pain, recurrence of the mass/lipoma, poor healing of the incision, possible need for revisional surgery or staged procedures.     Electronically signed by: Alfredo Batty, NP 01/14/2019 8:38 AM

## 2019-01-14 ENCOUNTER — Ambulatory Visit (INDEPENDENT_AMBULATORY_CARE_PROVIDER_SITE_OTHER): Payer: 59 | Admitting: Nurse Practitioner

## 2019-01-14 ENCOUNTER — Other Ambulatory Visit: Payer: Self-pay

## 2019-01-14 ENCOUNTER — Encounter: Payer: Self-pay | Admitting: Nurse Practitioner

## 2019-01-14 VITALS — BP 145/85 | HR 72 | Temp 96.9°F | Ht 65.0 in | Wt 152.8 lb

## 2019-01-14 DIAGNOSIS — D1722 Benign lipomatous neoplasm of skin and subcutaneous tissue of left arm: Secondary | ICD-10-CM

## 2019-01-16 ENCOUNTER — Encounter (HOSPITAL_BASED_OUTPATIENT_CLINIC_OR_DEPARTMENT_OTHER)
Admission: RE | Admit: 2019-01-16 | Discharge: 2019-01-16 | Disposition: A | Discharge status: Home / Self Care | Payer: 59 | Source: Ambulatory Visit | Attending: Plastic Surgery | Admitting: Plastic Surgery

## 2019-01-16 ENCOUNTER — Ambulatory Visit (HOSPITAL_COMMUNITY)
Admission: RE | Admit: 2019-01-16 | Discharge: 2019-01-16 | Disposition: A | Discharge status: Home / Self Care | Payer: 59 | Source: Ambulatory Visit | Attending: Plastic Surgery | Admitting: Plastic Surgery

## 2019-01-16 ENCOUNTER — Other Ambulatory Visit: Payer: Self-pay

## 2019-01-16 ENCOUNTER — Ambulatory Visit: Payer: 59 | Admitting: Family Medicine

## 2019-01-16 ENCOUNTER — Encounter (HOSPITAL_BASED_OUTPATIENT_CLINIC_OR_DEPARTMENT_OTHER): Payer: Self-pay | Admitting: *Deleted

## 2019-01-16 DIAGNOSIS — D1722 Benign lipomatous neoplasm of skin and subcutaneous tissue of left arm: Secondary | ICD-10-CM

## 2019-01-18 ENCOUNTER — Telehealth: Payer: Self-pay

## 2019-01-18 NOTE — Telephone Encounter (Signed)
Call to pt per Dr. Marla Roe to inform her that Dr. Marla Roe does need to have the MRI scan with contrast prior to surgery for excision of the mass I read the xray report from the ultrasound to pt & explained the reason for the radiologist request for further study with MRI Pt has cancelled her surgery with Dr. Marla Roe on 01/22/19- due to her insurance/deductible She wants to schedule the MRI & surgery in Jan. 2021 Dr. Marla Roe requests a "televisit" in [place] with pt for further plan of care Pt agrees with plan

## 2019-01-19 ENCOUNTER — Other Ambulatory Visit (HOSPITAL_COMMUNITY): Payer: 59

## 2019-01-23 ENCOUNTER — Ambulatory Visit (HOSPITAL_BASED_OUTPATIENT_CLINIC_OR_DEPARTMENT_OTHER): Admission: RE | Admit: 2019-01-23 | Payer: 59 | Source: Home / Self Care | Admitting: Plastic Surgery

## 2019-01-23 SURGERY — EXCISION MASS UPPER EXTREMITIES
Anesthesia: General | Site: Axilla | Laterality: Left

## 2019-02-01 ENCOUNTER — Encounter: Payer: 59 | Admitting: Plastic Surgery

## 2019-02-18 ENCOUNTER — Encounter: Payer: Self-pay | Admitting: Internal Medicine

## 2019-02-18 LAB — HM DIABETES EYE EXAM

## 2019-02-19 ENCOUNTER — Telehealth: Payer: Self-pay

## 2019-02-19 DIAGNOSIS — M7989 Other specified soft tissue disorders: Secondary | ICD-10-CM

## 2019-02-19 NOTE — Telephone Encounter (Signed)
The patient has a mass of the left upper arm.  She had an ultrasound which was nondiagnostic.  An MRI was suggested and recommended.  I have placed the order.  She does have allergy to contrast therefore it is without contrast.

## 2019-02-26 ENCOUNTER — Other Ambulatory Visit: Payer: 59

## 2019-02-26 ENCOUNTER — Other Ambulatory Visit: Payer: Self-pay

## 2019-02-26 DIAGNOSIS — R7303 Prediabetes: Secondary | ICD-10-CM

## 2019-02-27 ENCOUNTER — Other Ambulatory Visit: Payer: Self-pay

## 2019-02-27 ENCOUNTER — Ambulatory Visit (HOSPITAL_COMMUNITY)
Admission: RE | Admit: 2019-02-27 | Discharge: 2019-02-27 | Disposition: A | Discharge status: Home / Self Care | Payer: 59 | Source: Ambulatory Visit | Attending: Plastic Surgery | Admitting: Plastic Surgery

## 2019-02-27 DIAGNOSIS — M7989 Other specified soft tissue disorders: Secondary | ICD-10-CM | POA: Insufficient documentation

## 2019-02-27 LAB — HEMOGLOBIN A1C
Est. average glucose Bld gHb Est-mCnc: 160 mg/dL
Hgb A1c MFr Bld: 7.2 % — ABNORMAL HIGH (ref 4.8–5.6)

## 2019-02-27 LAB — POCT I-STAT CREATININE: Creatinine, Ser: 0.9 mg/dL (ref 0.44–1.00)

## 2019-02-27 MED ORDER — GADOBUTROL 1 MMOL/ML IV SOLN
7.5000 mL | Freq: Once | INTRAVENOUS | Status: AC | PRN
  Administered 2019-02-27: 7 mL via INTRAVENOUS

## 2019-03-01 ENCOUNTER — Ambulatory Visit (INDEPENDENT_AMBULATORY_CARE_PROVIDER_SITE_OTHER): Payer: 59 | Admitting: Family Medicine

## 2019-03-01 ENCOUNTER — Encounter: Payer: Self-pay | Admitting: Family Medicine

## 2019-03-01 ENCOUNTER — Other Ambulatory Visit: Payer: Self-pay

## 2019-03-01 DIAGNOSIS — E559 Vitamin D deficiency, unspecified: Secondary | ICD-10-CM | POA: Insufficient documentation

## 2019-03-01 DIAGNOSIS — E1169 Type 2 diabetes mellitus with other specified complication: Secondary | ICD-10-CM | POA: Diagnosis not present

## 2019-03-01 DIAGNOSIS — K219 Gastro-esophageal reflux disease without esophagitis: Secondary | ICD-10-CM

## 2019-03-01 DIAGNOSIS — E119 Type 2 diabetes mellitus without complications: Secondary | ICD-10-CM | POA: Insufficient documentation

## 2019-03-01 DIAGNOSIS — E782 Mixed hyperlipidemia: Secondary | ICD-10-CM

## 2019-03-01 DIAGNOSIS — E1159 Type 2 diabetes mellitus with other circulatory complications: Secondary | ICD-10-CM | POA: Diagnosis not present

## 2019-03-01 DIAGNOSIS — Z79899 Other long term (current) drug therapy: Secondary | ICD-10-CM

## 2019-03-01 DIAGNOSIS — I10 Essential (primary) hypertension: Secondary | ICD-10-CM

## 2019-03-01 MED ORDER — ATORVASTATIN CALCIUM 40 MG PO TABS: 40.0000 mg | ORAL_TABLET | Freq: Every day | ORAL | 0 refills | Status: DC

## 2019-03-01 MED ORDER — LOSARTAN POTASSIUM 100 MG PO TABS: 100.0000 mg | ORAL_TABLET | Freq: Every day | ORAL | 0 refills | Status: DC

## 2019-03-01 MED ORDER — METFORMIN HCL 500 MG PO TABS: 500.0000 mg | ORAL_TABLET | Freq: Two times a day (BID) | ORAL | 0 refills | Status: DC

## 2019-03-01 NOTE — Progress Notes (Addendum)
Telehealth office visit note for Dominique Brock, D.O- at Primary Care at [facility]   I connected with current patient today and verified that I am speaking with the correct person using two identifiers.   . Location of the patient: Home . Location of the provider: Office Only the patient (+/- their family members at pt's discretion) and myself were participating in the encounter - This visit type was conducted due to national recommendations for restrictions regarding the COVID-19 Pandemic (e.g. social distancing) in an effort to limit this patient's exposure and mitigate transmission in our community.  This format is felt to be most appropriate for this patient at this time.   - No physical exam could be performed with this format, beyond that communicated to [place] by the patient/ family members as noted.   - Additionally my office staff/ schedulers discussed with the patient that there may be a monetary charge related to this service, depending on their medical insurance.   The patient expressed understanding, and agreed to proceed.       History of Present Illness: Hypertension    I, Dominique Brock, am serving as scribe for Dr. [name].   Notes she has reviewed her labs prior to today's appointment.  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  In the process of getting her lipoma taken care of, she had an MRI this past Wednesday, "and to say the least, that was extremely stressful."   - [place] Just had an eye exam a week or two ago with optometrist.  Patient told the optometrist that she was prediabetic, and notes he "checked her for  everything" after that.  The patient was told "everything looks great."  She notes her eyes were dilated and states she was "checked for everything and told I was looking good."  Patient knows to check and see if she obtained a diabetic retinopathy screen.   - Vitamin D Patient has been taking Vitamin D 4500 IU's for two years.   - Concerns about COVID-19 Vaccine Notes she is very nervous about the COVID-19 vaccination because of the reactions / anaphylaxis she's heard occurring in other people.  Patient notes she is "very allergic to shellfish and mold," and had to carry an epi pen in the past.   - GERD Denies concerns.  Well-controlled on omeprazole.   HPI:   Diabetes Mellitus: Home glucose readings: Sometime in the past, she spoke with someone "a long time ago" who told her to check her blood sugar one hour after eating.   Has never been on metformin, but notes her mother was managed on this, "so I am familiar with some of the steps."  - Her denies acute concerns or problems related to treatment plan  - She denies new concerns.  Denies polyuria/polydipsia, hypo/ hyperglycemia symptoms.  Denies new onset of: chest pain, exercise intolerance, shortness of breath, dizziness, visual changes, headache, lower extremity swelling or claudication.   Last A1C in the office was:  Lab Results  Component Value Date   HGBA1C 7.2 (H) 02/26/2019   HGBA1C 6.3 (H) 09/13/2018  HGBA1C 6.1 03/02/2018   Lab Results  Component Value Date   MICROALBUR 2.7 (H) 12/06/2016   LDLCALC 62 09/13/2018   CREATININE 0.90 02/27/2019   BP Readings from Last 3 Encounters:  01/14/19 (!) 145/85  01/01/19 (!) 143/87  12/26/18 134/86   Wt Readings from Last 3 Encounters:  01/14/19 152 lb 12.8 oz (69.3 kg)  01/01/19 150 lb (68 kg)  12/26/18 149 lb 3.2 oz (67.7 kg)   HPI:  Hypertension:  -  Her blood pressure at home has been running: Has not been checking her blood pressure at home.    - Patient reports  good compliance with medication and/or lifestyle modification  - Her denies acute concerns or problems related to treatment plan  - She denies new onset of: chest pain, exercise intolerance, shortness of breath, dizziness, visual changes, headache, lower extremity swelling or claudication.   Last 3 blood pressure readings in our office are as follows: BP Readings from Last 3 Encounters:  01/14/19 (!) 145/85  01/01/19 (!) 143/87  12/26/18 134/86   There were no vitals filed for this visit.  HPI:  Hyperlipidemia:  64 y.o. female here for cholesterol follow-up.   - Patient reports good compliance with treatment plan of:  medication and/ or lifestyle management.    - Patient denies any acute concerns or problems with management plan   - She denies new onset of: myalgias, arthralgias, increased fatigue more than normal, chest pains, exercise intolerance, shortness of breath, dizziness, visual changes, headache, lower extremity swelling or claudication.   Most recent cholesterol panel was:  Lab Results  Component Value Date   CHOL 113 09/13/2018   HDL 39 (L) 09/13/2018   LDLCALC 62 09/13/2018   TRIG 61 09/13/2018   CHOLHDL 2.9 09/13/2018   Hepatic Function Latest Ref Rng & Units 09/13/2018 03/02/2018 12/06/2016  Total Protein 6.0 - 8.5 g/dL 6.1 - 6.0(L)  Albumin 3.8 - 4.8 g/dL 4.2 - -  AST 0 - 40 IU/L '20 19 14  ' ALT 0 - 32 IU/L 35(H) 20 16  Alk Phosphatase 39 - 117 IU/L 69 67 -  Total Bilirubin 0.0 - 1.2 mg/dL 0.5 - 0.3  Bilirubin, Direct 0.0 - 0.3 mg/dL - - -      GAD 7 : Generalized Anxiety Score 03/01/2019 12/26/2018 07/04/2018  Nervous, Anxious, on Edge 0 0 1  Control/stop worrying 1 0 0  Worry too much - different things 1 0 0  Trouble relaxing 0 0 1  Restless 0 0 0  Easily annoyed or irritable 3 0 0  Afraid - awful might happen 0 0 0  Total GAD 7 Score 5 0 2  Anxiety Difficulty Not difficult at all - Not difficult at all    Depression screen [facility] 2/9 03/01/2019  12/26/2018 07/04/2018 03/13/2018 01/15/2018  Decreased Interest 1 1 0 0 3  Down, Depressed, Hopeless 1 0 0 0 2  PHQ - 2 Score 2 1 0 0 5  Altered sleeping 0 0 0 0 2  Tired, decreased energy 2 3 0 0 1  Change in appetite 2 0 1 0 2  Feeling bad or failure about yourself  0 0 0 0 2  Trouble concentrating 0 0 0 0 1  Moving slowly or fidgety/restless 0 0 0 0 0  Suicidal thoughts 0 0 0 0 0  PHQ-9 Score '6 4 1 ' 0 13  Difficult doing work/chores Not difficult at all Not difficult at all - Not difficult at all Somewhat  difficult      Impression and Recommendations:    1. NEW ONSET- Type 2 diabetes mellitus with other specified complication, without long-term current use of insulin ([place])   2. NEW ONSET- Hypertension associated with diabetes ([place])   3. NEW ONSET-  Mixed diabetic hyperlipidemia associated with type 2 diabetes mellitus ([place])   4. Gastroesophageal reflux disease, unspecified whether esophagitis present   5. Vitamin D deficiency   6. High risk medications (not anticoagulants) long-term use      - Patient obtains thyroid management and hormone therapy per [facility]   NEW ONSET today - Type 2 DM - A1c most recently is 7.2, elevated, up from 6.3 prior. - Reviewed goal A1c of under 7.0 in diabetic patients.  - Reviewed recommendations and guidelines for diabetic patients.  Extensive education provided to patient today and lengthy conversation held.    - Counseled patient on pathophysiology of disease and discussed various treatment options, which always includes dietary and lifestyle modification as first line.    - Discussed indications for beginning metformin today. - Begin low-dose metformin, 500 mg every twelve hours.  See med list.  - Importance of low carb, heart-healthy diet discussed with patient in addition to regular aerobic exercise of 89mn 5d/week or more.  - Advised patient to begin checking her FBS and her blood sugar 2 hours after the biggest meal of her day.  Keep  log and bring in next OV for my review.     - Also told patient if you ever feel poorly, please check your blood pressure and blood sugar, as one or the other could be the cause of your symptoms.  - Pt reminded about need for yearly eye and foot exams.  Told patient to make appt.for diabetic eye exam, CMAs here will do foot exams  - Patient knows to call optometrist and ask whether or not she recently obtained a diabetic retinopathy screen at her last visit.  - Handouts provided at patient's desire and or told to go online at the American Diabetes Association website for further information  - We will continue to monitor.   NEW ONSET - Hypertension associated with DM - Per patient, not currently checking her blood pressure at home.  - Now that patient is diagnosed with diabetes, emphasized critical importance of checking blood pressure regularly and monitoring to ensure BP within normal limits.  - Reviewed patient's trend of historically higher blood pressure, and discussed that in diabetics, goal BP should be 130/80 or less.  - Will increase losartan to 100 mg, up from 50 mg.  See med list.  - Counseled patient on pathophysiology of disease and discussed various treatment options, which always includes dietary and lifestyle modification as first line.   - Lifestyle changes such as dash and heart healthy diets and engaging in a regular exercise program discussed extensively with patient.   - Ambulatory blood pressure monitoring encouraged at least 3 times weekly.  Keep log and bring in every office visit.  Reminded patient that if they ever feel poorly in any way, to check their blood pressure and pulse.  - Handouts provided at patient's desire and/or told to go [place] the [place] further information.  - We will continue to monitor.   NEW ONSET - Mixed Diabetic Hyperlipidemia associated with DM - Cholesterol levels at goal last check.  - LDL last  measured at 62, five months ago, under appropriate control for diabetics. - With new diagnosis  of diabetes, recommended re-check near future.  - Continue management on statin as established.  See med list.  - Prudent dietary changes such as low saturated & trans fat diets for hyperlipidemia and low carb diets for hypertriglyceridemia discussed with patient.    - Encouraged patient to follow AHA guidelines for regular exercise and also maintain a BMI below 25.0.  - Educational handouts provided at patient's desire and/ or told to look online at the [employer] website for further information.  - We will continue to monitor and re-check as discussed.   GERD - Stable at this time; well-controlled on omeprazole. - Continue management as prescribed.  See med list. - Will continue to monitor.   COVID-19 Counseling - Novel Covid -19 counseling done; all questions were answered.   - Reviewed expectations for COVID-19 vaccination with patient during appointment. - Current CDC / federal and [place] guidelines reviewed with patient  - Reminded pt of extreme importance of social distancing; wearing a mask when out in public; insensate handwashing and cleaning of surfaces, avoiding unnecessary trips for shopping and avoiding ALL but emergency appts etc. - Told patient to be prepared, not scared; and be smart for the sake of others - Patient will call with any additional concerns   Recommendations - Return in 3 months for follow-up OV with lab work (CBC, CMP, A1c, FLP, Vit D, urine) prior.   - As part of my medical decision making, I reviewed the following data within the [place] History obtained from pt /family, CMA notes reviewed and incorporated if applicable, Labs reviewed, Radiograph/ tests reviewed if applicable and OV notes from prior OV's with me, as well as other specialists she/he has seen since seeing me last, were all reviewed and used in my  medical decision making process today.    - Additionally, discussion had with patient regarding our treatment plan, and their biases/concerns about that plan were used in my medical decision making today.    - The patient agreed with the plan and demonstrated an understanding of the instructions.   No barriers to understanding were identified.    - Red flag symptoms and signs discussed in detail.  Patient expressed understanding regarding what to do in case of emergency\ urgent symptoms.   - The patient was advised to call back or seek an in-person evaluation if the symptoms worsen or if the condition fails to improve as anticipated.    Return for f/up OV in 3 months, with labs (CBC, CMP, A1c, FLP, Vit D, urine) 3-5 days prior.     Orders Placed This Encounter  Procedures  . CBC with Diff/Plt  . Comprehensive metabolic panel  . Hemoglobin A1c  . Lipid panel  . Magnesium  . Urine Microalbumin / creatinine  . VITAMIN D 25 Hydroxy (Vit-D Deficiency, Fractures)    Meds ordered this encounter  Medications  . losartan (COZAAR) 100 MG tablet    Sig: Take 1 tablet (100 mg total) by mouth daily. No rf until OV    Dispense:  90 tablet    Refill:  0  . metFORMIN (GLUCOPHAGE) 500 MG tablet    Sig: Take 1 tablet (500 mg total) by mouth 2 (two) times daily with a meal.    Dispense:  180 tablet    Refill:  0    No rf until OV  . atorvastatin (LIPITOR) 40 MG tablet    Sig: Take 1 tablet (40 mg total) by mouth at bedtime.  Dispense:  90 tablet    Refill:  0    Medications Discontinued During This Encounter  Medication Reason  . losartan (COZAAR) 50 MG tablet   . atorvastatin (LIPITOR) 40 MG tablet       I provided 28+ minutes of non face-to-face time during this encounter.  Additional time was spent with charting and coordination of care before and after the actual visit commenced.   Note:  This note was prepared with assistance of Dragon voice recognition software. Occasional  wrong-word or sound-a-like substitutions may have occurred due to the inherent limitations of voice recognition software.  This document serves as a record of services personally performed by Dominique Dance, DO. It was created on her behalf by Dominique Brock, a trained medical scribe. The creation of this record is based on the scribe's personal observations and the provider's statements to them.   This case required medical decision making of at least moderate complexity. The above documentation has been reviewed to be accurate and was completed by Marjory Sneddon, D.O.     Patient Care Team    Relationship Specialty Notifications Start End  Dominique Dance, DO PCP - General Family Medicine  01/15/18   Bobbitt, [place] Muta, MD Consulting Physician Allergy and Immunology  01/15/18   Maisie Fus, MD Consulting Physician Obstetrics and Gynecology  01/15/18   Teodoro Spray, OD  Optometry  03/01/19    Comment: does yrly Diabetic Retinopathy exams     -Vitals obtained; medications/ allergies reconciled;  personal medical, social, Sx etc.histories were updated by CMA, reviewed by me and are reflected in chart   Patient Active Problem List   Diagnosis Date Noted  . Diabetes mellitus ([place]) 03/01/2019  . Mixed diabetic hyperlipidemia associated with type 2 diabetes mellitus ([place]) 03/01/2019  . Hypertension associated with diabetes ([place]) 01/15/2018  . Gastroesophageal reflux disease 01/15/2018  . Hypothyroidism 02/05/2016  . Moderate persistent asthma 02/17/2007  . Obstructive sleep apnea, hx of 02/17/2007  . Chronic sinusitis 09/19/2016  . Environmental and seasonal allergies 06/15/2016  . Anemia 06/15/2016  . Vitamin D deficiency 03/01/2019  . 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 Perennial allergic rhinitis 09/19/2016  . Rhinitis medicamentosa 09/19/2016  . 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 Eczema 06/15/2016  . EPISTAXIS, RECURRENT 03/24/2009  . BRONCHITIS,  ACUTE 10/31/2008  . Sinusitis, chronic 02/29/2008  . ABDOMINAL PAIN, RIGHT UPPER QUADRANT 06/29/2007  . LIPOMA OF UNSPECIFIED SITE 06/12/2007  . SYNCOPE, VASOVAGAL 06/12/2007     Current Meds  Medication Sig  . atenolol (TENORMIN) 25 MG tablet Take 1 tablet (25 mg total) by mouth daily.  Marland Kitchen atorvastatin (LIPITOR) 40 MG tablet Take 1 tablet (40 mg total) by mouth at bedtime.  . Azelastine-Fluticasone 137-50 MCG/ACT SUSP 1 spray each nostril twice daily following sinus rinse  . cholecalciferol (VITAMIN D3) 25 MCG (1000 UT) tablet Take 1,000 Units by mouth daily.  Marland Kitchen levothyroxine (SYNTHROID, LEVOTHROID) 100 MCG tablet TAKE 1 TABLET BY MOUTH ONCE DAILY ON AN EMPTY STOMACH IN THE MORNING  . losartan (COZAAR) 100 MG tablet Take 1 tablet (100 mg total) by mouth daily. No rf until OV  . Multiple Vitamin (MULTIVITAMIN) tablet Take 1 tablet by mouth daily.  Marland Kitchen omeprazole (PRILOSEC) 20 MG capsule Take 1 capsule (20 mg total) by mouth daily.  . [DISCONTINUED] atorvastatin (LIPITOR) 40 MG tablet Take 1 tablet (40 mg total) by mouth at bedtime.  . [DISCONTINUED] losartan (COZAAR) 50 MG tablet Take 1 tablet (  50 mg total) by mouth daily.     Allergies:  Allergies  Allergen Reactions  . Azithromycin Rash    REACTION: rash  . Codeine Nausea And Vomiting  . Penicillins Rash  . Shellfish Allergy Rash     ROS:  See above HPI for pertinent positives and negatives   Objective:   There were no vitals taken for this visit.  (if some vitals are omitted, this means that patient was UNABLE to obtain them even though they were asked to get them prior to OV today.  They were asked to call us at their earliest convenience with these once obtained. )  General: A & O * 3; sounds in no acute distress; in usual state of health.  Skin: Pt confirms warm and dry extremities and pink fingertips HEENT: Pt confirms lips non-cyanotic Chest: Patient confirms normal chest excursion and movement Respiratory: speaking  in full sentences, no conversational dyspnea; patient confirms no use of accessory muscles Psych: insight appears good, mood- appears full

## 2019-03-01 NOTE — Patient Instructions (Addendum)
- Check BP daily for 2-3 wks, then if at goal of less than 130/80, then change to 2-3 times weekly   -  Check BS 2 hrs after largest meal and fasting- write down and bring in notebook of these numbers each office visit.       Diabetes Mellitus and Standards of Medical Care  Managing diabetes (diabetes mellitus) can be complicated. Your diabetes treatment may be managed by a team of health care providers, including:  A diet and nutrition specialist (registered dietitian).  A nurse.  A certified diabetes educator (CDE).  A diabetes specialist (endocrinologist).  An eye doctor.  A primary care provider.  A dentist.  Your health care providers follow a schedule in order to help you get the best quality of care. The following schedule is a general guideline for your diabetes management plan. Your health care providers may also give you more specific instructions.  HbA1c (hemoglobin A1c) test This test provides information about blood sugar (glucose) control over the previous 2-3 months. It is used to check whether your diabetes management plan needs to be adjusted.  If you are meeting your treatment goals, this test is done at least 2 times a year.  If you are not meeting treatment goals or if your treatment goals have changed, this test is done 4 times a year.  Blood pressure test  This test is done at every routine medical visit. For most people, the goal is less than 130/80. Ask your health care provider what your goal blood pressure should be.  Dental and eye exams  Visit your dentist two times a year.  If you have type 1 diabetes, get an eye exam 3-5 years after you are diagnosed, and then once a year after your first exam. ? If you were diagnosed with type 1 diabetes as a child, get an eye exam when you are age 50 or older and have had diabetes for 3-5 years. After the first exam, you should get an eye exam once a year.  If you have type 2 diabetes, have an eye exam as  soon as you are diagnosed, and then once a year after your first exam.  Foot care exam  Visual foot exams are done at every routine medical visit. The exams check for cuts, bruises, redness, blisters, sores, or other problems with the feet.  A complete foot exam is done by your health care provider once a year. This exam includes an inspection of the structure and skin of your feet, and a check of the pulses and sensation in your feet. ? Type 1 diabetes: Get your first exam 3-5 years after diagnosis. ? Type 2 diabetes: Get your first exam as soon as you are diagnosed.  Check your feet every day for cuts, bruises, redness, blisters, or sores. If you have any of these or other problems that are not healing, contact your health care provider.  Kidney function test (urine microalbumin)  This test is done once a year. ? Type 1 diabetes: Get your first test 5 years after diagnosis. ? Type 2 diabetes: Get your first test as soon as you are diagnosed._  If you have chronic kidney disease (CKD), get a serum creatinine and estimated glomerular filtration rate (eGFR) test once a year.  Lipid profile (cholesterol, HDL, LDL, triglycerides)  This test should be done when you are diagnosed with diabetes, and every 5 years after the first test. If you are on medicines to lower your cholesterol, you  may need to get this test done every year. ? The goal for LDL is less than 100 mg/dL (5.5 mmol/L). If you are at high risk, the goal is less than 70 mg/dL (3.9 mmol/L). ? The goal for HDL is 40 mg/dL (2.2 mmol/L) for men and 50 mg/dL(2.8 mmol/L) for women. An HDL cholesterol of 60 mg/dL (3.3 mmol/L) or higher gives some protection against heart disease. ? The goal for triglycerides is less than 150 mg/dL (8.3 mmol/L).  Immunizations  The yearly flu (influenza) vaccine is recommended for everyone 6 months or older who has diabetes.  The pneumonia (pneumococcal) vaccine is recommended for everyone 2 years or  older who has diabetes. If you are 40 or older, you may get the pneumonia vaccine as a series of two separate shots.  The hepatitis B vaccine is recommended for adults shortly after they have been diagnosed with diabetes.  The Tdap (tetanus, diphtheria, and pertussis) vaccine should be given: ? According to normal childhood vaccination schedules, for children. ? Every 10 years, for adults who have diabetes.  The shingles vaccine is recommended for people who have had chicken pox and are 50 years or older.  Mental and emotional health  Screening for symptoms of eating disorders, anxiety, and depression is recommended at the time of diagnosis and afterward as needed. If your screening shows that you have symptoms (you have a positive screening result), you may need further evaluation and be referred to a mental health care provider.  Diabetes self-management education  Education about how to manage your diabetes is recommended at diagnosis and ongoing as needed.  Treatment plan  Your treatment plan will be reviewed at every medical visit.  Summary  Managing diabetes (diabetes mellitus) can be complicated. Your diabetes treatment may be managed by a team of health care providers.  Your health care providers follow a schedule in order to help you get the best quality of care.  Standards of care including having regular physical exams, blood tests, blood pressure monitoring, immunizations, screening tests, and education about how to manage your diabetes.  Your health care providers may also give you more specific instructions based on your individual health.      Type 2 Diabetes Mellitus, Self Care, Adult Caring for yourself after you have been diagnosed with type 2 diabetes (type 2 diabetes mellitus) means keeping your blood sugar (glucose) under control with a balance of:  Nutrition.  Exercise.  Lifestyle changes.  Medicines or insulin, if necessary.  Support from your  team of health care providers and others.  The following information explains what you need to know to manage your diabetes at home. What do I need to do to manage my blood glucose?  Check your blood glucose every day, as often as told by your health care provider.  Contact your health care provider if your blood glucose is above your target for 2 tests in a row.  Have your A1c (hemoglobin A1c) level checked at least two times a year, or as often as told by your health care provider. Your health care provider will set individualized treatment goals for you. Generally, the goal of treatment is to maintain the following blood glucose levels:  Before meals (preprandial): 80-130 mg/dL (4.4-7.2 mmol/L).  After meals (postprandial): below 180 mg/dL (10 mmol/L).  A1c level: less than 7%.  What do I need to know about hyperglycemia and hypoglycemia? What is hyperglycemia? Hyperglycemia, also called high blood glucose, occurs when blood glucose is too high.Make  sure you know the early signs of hyperglycemia, such as:  Increased thirst.  Hunger.  Feeling very tired.  Needing to urinate more often than usual.  Blurry vision.  What is hypoglycemia? Hypoglycemia, also called low blood glucose, occurswith a blood glucose level at or below 70 mg/dL (3.9 mmol/L). The risk for hypoglycemia increases during or after exercise, during sleep, during illness, and when skipping meals or not eating for a long time (fasting). It is important to know the symptoms of hypoglycemia and treat it right away. Always have a 15-gram rapid-acting carbohydrate snack with you to treat low blood glucose. Family members and close friends should also know the symptoms and should understand how to treat hypoglycemia, in case you are not able to treat yourself. What are the symptoms of hypoglycemia? Hypoglycemia symptoms can include:  Hunger.  Anxiety.  Sweating and feeling clammy.  Confusion.  Dizziness or  feeling light-headed.  Sleepiness.  Nausea.  Increased heart rate.  Headache.  Blurry vision.  Seizure.  Nightmares.  Tingling or numbness around the mouth, lips, or tongue.  A change in speech.  Decreased ability to concentrate.  A change in coordination.  Restless sleep.  Tremors or shakes.  Fainting.  Irritability.  How do I treat hypoglycemia?  If you are alert and able to swallow safely, follow the 15:15 rule:  Take 15 grams of a rapid-acting carbohydrate. Rapid-acting options include: ? 1 tube of glucose gel. ? 3 glucose pills. ? 6-8 pieces of hard candy. ? 4 oz (120 mL) of fruit juice. ? 4 oz (120 mL) of regular (not diet) soda.  Check your blood glucose 15 minutes after you take the carbohydrate.  If the repeat blood glucose level is still at or below 70 mg/dL (3.9 mmol/L), take 15 grams of a carbohydrate again.  If your blood glucose level does not increase above 70 mg/dL (3.9 mmol/L) after 3 tries, seek emergency medical care.  After your blood glucose level returns to normal, eat a meal or a snack within 1 hour.  How do I treat severe hypoglycemia? Severe hypoglycemia is when your blood glucose level is at or below 54 mg/dL (3 mmol/L). Severe hypoglycemia is an emergency. Do not wait to see if the symptoms will go away. Get medical help right away. Call your local emergency services (911 in the [place] Do not drive yourself to the hospital. If you have severe hypoglycemia and you cannot eat or drink, you may need an injection of glucagon. A family member or close friend should learn how to check your blood glucose and how to give you a glucagon injection. Ask your health care provider if you need to have an emergency glucagon injection kit available. Severe hypoglycemia may need to be treated in a hospital. The treatment may include getting glucose through an IV tube. You may also need treatment for the cause of your hypoglycemia. Can having diabetes  put me at risk for other conditions? Having diabetes can put you at risk for other long-term (chronic) conditions, such as heart disease and kidney disease. Your health care provider may prescribe medicines to help prevent complications from diabetes. These medicines may include:  Aspirin.  Medicine to lower cholesterol.  Medicine to control blood pressure.  What else can I do to manage my diabetes? Take your diabetes medicines as told  If your health care provider prescribed insulin or diabetes medicines, take them every day.  Do not run out of insulin or other diabetes medicines that  you take. Plan ahead so you always have these available.  If you use insulin, adjust your dosage based on how physically active you are and what foods you eat. Your health care provider will tell you how to adjust your dosage. Make healthy food choices  The things that you eat and drink affect your blood glucose and your insulin dosage. Making good choices helps to control your diabetes and prevent other health problems. A healthy meal plan includes eating lean proteins, complex carbohydrates, fresh fruits and vegetables, low-fat dairy products, and healthy fats. Make an appointment to see a diet and nutrition specialist (registered dietitian) to help you create an eating plan that is right for you. Make sure that you:  Follow instructions from your health care provider about eating or drinking restrictions.  Drink enough fluid to keep your urine clear or pale yellow.  Eat healthy snacks between nutritious meals.  Track the carbohydrates that you eat. Do this by reading food labels and learning the standard serving sizes of foods.  Follow your sick day plan whenever you cannot eat or drink as usual. Make this plan in advance with your health care provider.  Stay active  Exercise regularly, as told by your health care provider. This may include:  Stretching and doing strength exercises, such as yoga  or weightlifting, at least 2 times a week.  Doing at least 150 minutes of moderate-intensity or vigorous-intensity exercise each week. This could be brisk walking, biking, or water aerobics. ? Spread out your activity over at least 3 days of the week. ? Do not go more than 2 days in a row without doing some kind of physical activity.  When you start a new exercise or activity, work with your health care provider to adjust your insulin, medicines, or food intake as needed. Make healthy lifestyle choices  Do not use any tobacco products, such as cigarettes, chewing tobacco, and e-cigarettes. If you need help quitting, ask your health care provider.  If your health care provider says that alcohol is safe for you, limit alcohol intake to no more than 1 drink per day for nonpregnant women and 2 drinks per day for men. One drink equals 12 oz of beer, 5 oz of wine, or 1 oz of hard liquor.  Learn to manage stress. If you need help with this, ask your health care provider. Care for your body   Keep your immunizations up to date. In addition to getting vaccinations as told by your health care provider, it is recommended that you get vaccinated against the following illnesses: ? The flu (influenza). Get a flu shot every year. ? Pneumonia. ? Hepatitis B.  Schedule an eye exam soon after your diagnosis, and then one time every year after that.  Check your skin and feet every day for cuts, bruises, redness, blisters, or sores. Schedule a foot exam with your health care provider once every year.  Brush your teeth and gums two times a day, and floss at least one time a day. Visit your dentist at least once every 6 months.  Maintain a healthy weight. General instructions  Take over-the-counter and prescription medicines only as told by your health care provider.  Share your diabetes management plan with people in your workplace, school, and household.  Check your urine for ketones when you are ill  and as told by your health care provider.  Ask your health care provider: ? Do I need to meet with a diabetes educator? ? Where  can I find a support group for people with diabetes?  Carry a medical alert card or wear medical alert jewelry.  Keep all follow-up visits as told by your health care provider. This is important. Where to find more information: For more information about diabetes, visit:  American Diabetes Association (ADA): www.diabetes.org  American Association of Diabetes Educators (AADE): www.diabeteseducator.org/patient-resources  This information is not intended to replace advice given to you by your health care provider. Make sure you discuss any questions you have with your health care provider. Document Released: 05/25/2015 Document Revised: 07/09/2015 Document Reviewed: 03/06/2015 Elsevier Interactive Patient Education  2017 [place].      Blood Glucose Monitoring, Adult Monitoring your blood sugar (glucose) helps you manage your diabetes. It also helps you and your health care provider determine how well your diabetes management plan is working. Blood glucose monitoring involves checking your blood glucose as often as directed, and keeping a record (log) of your results over time. Why should I monitor my blood glucose? Checking your blood glucose regularly can:  Help you understand how food, exercise, illnesses, and medicines affect your blood glucose.  Let you know what your blood glucose is at any time. You can quickly tell if you are having low blood glucose (hypoglycemia) or high blood glucose (hyperglycemia).  Help you and your health care provider adjust your medicines as needed.  When should I check my blood glucose? Follow instructions from your health care provider about how often to check your blood glucose.   This may depend on:  The type of diabetes you have.  How well-controlled your diabetes is.  Medicines you are taking.  If you have  type 1 diabetes:  Check your blood glucose at least 2 times a day.  Also check your blood glucose: ? Before every insulin injection. ? Before and after exercise. ? Between meals. ? 2 hours after a meal. ? Occasionally between 2:00 a.m. and 3:00 a.m., as directed. ? Before potentially dangerous tasks, like driving or using heavy machinery. ? At bedtime.  You may need to check your blood glucose more often, up to 6-10 times a day: ? If you use an insulin pump. ? If you need multiple daily injections (MDI). ? If your diabetes is not well-controlled. ? If you are ill. ? If you have a history of severe hypoglycemia. ? If you have a history of not knowing when your blood glucose is getting low (hypoglycemia unawareness).  If you have type 2 diabetes:  If you take insulin or other diabetes medicines, check your blood glucose at least 2 times a day.  If you are on intensive insulin therapy, check your blood glucose at least 4 times a day. Occasionally, you may also need to check between 2:00 a.m. and 3:00 a.m., as directed.  Also check your blood glucose: ? Before and after exercise. ? Before potentially dangerous tasks, like driving or using heavy machinery.  You may need to check your blood glucose more often if: ? Your medicine is being adjusted. ? Your diabetes is not well-controlled. ? You are ill.  What is a blood glucose log?  A blood glucose log is a record of your blood glucose readings. It helps you and your health care provider: ? Look for patterns in your blood glucose over time. ? Adjust your diabetes management plan as needed.  Every time you check your blood glucose, write down your result and notes about things that may be affecting your blood glucose,  such as your diet and exercise for the day.  Most glucose meters store a record of glucose readings in the meter. Some meters allow you to download your records to a computer. How do I check my blood  glucose? Follow these steps to get accurate readings of your blood glucose: Supplies needed   Blood glucose meter.  Test strips for your meter. Each meter has its own strips. You must use the strips that come with your meter.  A needle to prick your finger (lancet). Do not use lancets more than once.  A device that holds the lancet (lancing device).  A journal or log book to write down your results.  Procedure  Wash your hands with soap and water.  Prick the side of your finger (not the tip) with the lancet. Use a different finger each time.  Gently rub the finger until a small drop of blood appears.  Follow instructions that come with your meter for inserting the test strip, applying blood to the strip, and using your blood glucose meter.  Write down your result and any notes.  Alternative testing sites  Some meters allow you to use areas of your body other than your finger (alternative sites) to test your blood.  If you think you may have hypoglycemia, or if you have hypoglycemia unawareness, do not use alternative sites. Use your finger instead.  Alternative sites may not be as accurate as the fingers, because blood flow is slower in these areas. This means that the result you get may be delayed, and it may be different from the result that you would get from your finger.  The most common alternative sites are: ? Forearm. ? Thigh. ? Palm of the hand.  Additional tips  Always keep your supplies with you.  If you have questions or need help, all blood glucose meters have a 24-hour "hotline" number that you can call. You may also contact your health care provider.  After you use a few boxes of test strips, adjust (calibrate) your blood glucose meter by following instructions that came with your meter.    The American Diabetes Association suggests the following targets for most nonpregnant adults with diabetes.  More or less stringent glycemic goals may be appropriate  for each individual.  A1C: Less than 7% A1C may also be reported as eAG: Less than 154 mg/dl Before a meal (preprandial plasma glucose): 80-130 mg/dl 1-2 hours after beginning of the meal (Postprandial plasma glucose)*: Less than 180 mg/dl  *Postprandial glucose may be targeted if A1C goals are not met despite reaching preprandial glucose goals.   GOALS in short:  The goals are for the Hgb A1C to be less than 7.0 & blood pressure to be less than 130/80.    It is recommended that all diabetics are educated on and follow a healthy diabetic diet, exercise for 30 minutes 3-4 times per week (walking, biking, swimming, or machine), monitor blood glucose readings and bring that record with you to be reviewed at your next office visit.     You should be checking fasting blood sugars- especially after you eat poorly or eat really healthy, and also check 2 hour postprandial blood sugars after largest meal of the day.    Write these down and bring in your log at each office visit.    You will need to be seen every 3 months by the provider managing your Diabetes unless told otherwise by that provider.   You will need yearly  eye exams from an eye specialist and foot exams to check the nerves of your feet.  Also, your urine should be checked yearly as well to make sure excess protein is not present.   If you are checking your blood pressure at home, please record it and bring it to your next office visit.    Follow the Dietary Approaches to Stop Hypertension (DASH) diet (3 servings of fruit and vegetables daily, whole grains, low sodium, low-fat proteins).  See below.    Lastly, when it comes to your cholesterol, the goal is to have the HDL (good cholesterol) >40, and the LDL (bad cholesterol) <100.   It is recommended that you follow a heart healthy, low saturated and trans-fat diet and exercise for 30 minutes at least 5 times a week.     (( Check out the DASH diet = 1.5 Gram Low Sodium Diet   A 1.5  gram sodium diet restricts the amount of sodium in the diet to no more than 1.5 g or 1500 mg daily.  The American Heart Association recommends Americans over the age of 14 to consume no more than 1500 mg of sodium each day to reduce the risk of developing high blood pressure.  Research also shows that limiting sodium may reduce heart attack and stroke risk.  Many foods contain sodium for flavor and sometimes as a preservative.  When the amount of sodium in a diet needs to be low, it is important to know what to look for when choosing foods and drinks.  The following includes some information and guidelines to help make it easier for you to adapt to a low sodium diet.    QUICK TIPS  Do not add salt to food.  Avoid convenience items and fast food.  Choose unsalted snack foods.  Buy lower sodium products, often labeled as "lower sodium" or "no salt added."  Check food labels to learn how much sodium is in 1 serving.  When eating at a restaurant, ask that your food be prepared with less salt or none, if possible.    READING FOOD LABELS FOR SODIUM INFORMATION  The nutrition facts label is a good place to find how much sodium is in foods. Look for products with no more than 400 mg of sodium per serving.  Remember that 1.5 g = 1500 mg.  The food label may also list foods as:  Sodium-free: Less than 5 mg in a serving.  Very low sodium: 35 mg or less in a serving.  Low-sodium: 140 mg or less in a serving.  Light in sodium: 50% less sodium in a serving. For example, if a food that usually has 300 mg of sodium is changed to become light in sodium, it will have 150 mg of sodium.  Reduced sodium: 25% less sodium in a serving. For example, if a food that usually has 400 mg of sodium is changed to reduced sodium, it will have 300 mg of sodium.    CHOOSING FOODS  Grains  Avoid: Salted crackers and snack items. Some cereals, including instant hot cereals. Bread stuffing and biscuit mixes. Seasoned rice or  pasta mixes.  Choose: Unsalted snack items. Low-sodium cereals, oats, puffed wheat and rice, shredded wheat. English muffins and bread. Pasta.  Meats  Avoid: Salted, canned, smoked, spiced, pickled meats, including fish and poultry. Bacon, ham, sausage, cold cuts, hot dogs, anchovies.  Choose: Low-sodium canned tuna and salmon. Fresh or frozen meat, poultry, and fish.  Dairy  Avoid:  Processed cheese and spreads. Cottage cheese. Buttermilk and condensed milk. Regular cheese.  Choose: Milk. Low-sodium cottage cheese. Yogurt. Sour cream. Low-sodium cheese.  Fruits and Vegetables  Avoid: Regular canned vegetables. Regular canned tomato sauce and paste. Frozen vegetables in sauces. Olives. Angie Fava. Relishes. Sauerkraut.  Choose: Low-sodium canned vegetables. Low-sodium tomato sauce and paste. Frozen or fresh vegetables. Fresh and frozen fruit.  Condiments  Avoid: Canned and packaged gravies. Worcestershire sauce. Tartar sauce. Barbecue sauce. Soy sauce. Steak sauce. Ketchup. Onion, garlic, and table salt. Meat flavorings and tenderizers.  Choose: Fresh and dried herbs and spices. Low-sodium varieties of mustard and ketchup. Lemon juice. Tabasco sauce. Horseradish.    SAMPLE 1.5 GRAM SODIUM MEAL PLAN:   Breakfast / Sodium (mg)  1 cup low-fat milk / 143 mg  1 whole-wheat English muffin / 240 mg  1 tbs heart-healthy margarine / 153 mg  1 hard-boiled egg / 139 mg  1 small orange / 0 mg  Lunch / Sodium (mg)  1 cup raw carrots / 76 mg  2 tbs no salt added peanut butter / 5 mg  2 slices whole-wheat bread / 270 mg  1 tbs jelly / 6 mg   cup red grapes / 2 mg  Dinner / Sodium (mg)  1 cup whole-wheat pasta / 2 mg  1 cup low-sodium tomato sauce / 73 mg  3 oz lean ground beef / 57 mg  1 small side salad (1 cup raw spinach leaves,  cup cucumber,  cup yellow bell pepper) with 1 tsp olive oil and 1 tsp red wine vinegar / 25 mg  Snack / Sodium (mg)  1 container low-fat vanilla yogurt / 107 mg  3  graham cracker squares / 127 mg  Nutrient Analysis  Calories: 1745  Protein: 75 g  Carbohydrate: 237 g  Fat: 57 g  Sodium: 1425 mg  Document Released: 01/31/2005 Document Revised: 10/13/2010 Document Reviewed: 05/04/2009  ExitCare Patient Information 2012 [place].))    This information is not intended to replace advice given to you by your health care provider. Make sure you discuss any questions you have with your health care provider. Document Released: 02/03/2003 Document Revised: 08/21/2015 Document Reviewed: 07/13/2015 Elsevier Interactive Patient Education  2017 [employer].

## 2019-03-04 ENCOUNTER — Telehealth: Payer: Self-pay | Admitting: Family Medicine

## 2019-03-04 NOTE — Telephone Encounter (Signed)
Patient called states she Can's remember Dr. Hershal Coria dosage instruction regarding the Metformin was it  12hrs apart & ask for provider or nurse to call her back to clarify.  --phone# [phone]  --glh

## 2019-03-04 NOTE — Telephone Encounter (Signed)
RX written 1 tab by mouth 2 times daily. Left message for patient with these instructions. AS, CMA

## 2019-03-05 ENCOUNTER — Other Ambulatory Visit: Payer: Self-pay

## 2019-03-05 ENCOUNTER — Telehealth: Payer: 59 | Admitting: Plastic Surgery

## 2019-03-05 DIAGNOSIS — D1722 Benign lipomatous neoplasm of skin and subcutaneous tissue of left arm: Secondary | ICD-10-CM

## 2019-03-05 NOTE — Progress Notes (Signed)
The patient is a 64 year old female joining me by a telephone visit.  She has a large mass on her left upper arm.  It is in the left axillary area.  An ultrasound was done followed by an MR.  It appears that it is not involving the muscle.  It also appears that it is a straightforward lipoma.  With this in mind the patient would like to proceed.  We will therefore schedule for excision of left arm lipoma excision.  We also spent time discussing the need for decreasing her hemoglobin A1c which has been elevated this year.  She was started on Metformin.  I talked to her about low-carb low sugar diet between now and surgery.  The purpose for this is improved healing.

## 2019-03-11 ENCOUNTER — Telehealth: Payer: Self-pay | Admitting: Family Medicine

## 2019-03-11 NOTE — Telephone Encounter (Signed)
Spoke with patient and her BP readings are as follows:  03/04/19 128/82 (all taken in AM before meds) 03/05/19 122/83 03/06/19 115/81 03/07/19 113/79 Today 116/78    Patient states that she is really fatigue, felling like she has some SOB, Fingers and toes are going numb.   Patient recently had adjustment of BP medications.    Disregard the readings below. Patient has not had a BP reading of 115/18. AS, CMA

## 2019-03-11 NOTE — Telephone Encounter (Signed)
Please see prior note

## 2019-03-11 NOTE — Telephone Encounter (Signed)
Patient last office visit just a couple of days ago was newly diagnosed with diabetes.  She supposed to be taking Metformin, increased blood pressure medicines.  Her blood pressure appears to be at goal.  That is great.  Tell her to continue the higher dose of the blood pressure medicine.  Also, as instructed, she should be checking her fasting blood sugars first thing in the morning as well as 2 hours after largest meal.  Tell her to please make sure she is doing so and call us with any questions or concerns.  It is very important she follow-up closely as we discussed last office visit since there was no medicines/new diagnoses.  Thank you.  1. NEW ONSET- Type 2 diabetes mellitus with other specified complication, without long-term current use of insulin ([place])   2. NEW ONSET- Hypertension associated with diabetes ([place])   3. NEW ONSET-  Mixed diabetic hyperlipidemia associated with type 2 diabetes mellitus ([place])

## 2019-03-11 NOTE — Telephone Encounter (Signed)
Patient called states her BP has been low :  116/78,  115/18,  117/74 And she is concerned that the BP med is taking it to low causing her to have shortness of breath & extreme tireness. --Forwarding message to med asst for review w/provider & to contact patient @ [phone] w/ advice.  --glh

## 2019-03-11 NOTE — Telephone Encounter (Signed)
Per Dr. Jenetta Downer patient is newly diabetic and was just started on Metformin. Patient taking 500mg  1 po BID. Per Opalski advised patient to take 1/2 tab 1 po bid and follow up via doxy or telehealth to discuss issues. Patient was advised to continue to take BP med because her bps are great. Call transferred to front desk for scheduling. AS, CMA

## 2019-03-12 ENCOUNTER — Ambulatory Visit (INDEPENDENT_AMBULATORY_CARE_PROVIDER_SITE_OTHER): Payer: 59 | Admitting: Family Medicine

## 2019-03-12 ENCOUNTER — Encounter: Payer: Self-pay | Admitting: Family Medicine

## 2019-03-12 ENCOUNTER — Other Ambulatory Visit: Payer: Self-pay

## 2019-03-12 VITALS — BP 117/83 | Ht 65.0 in | Wt 148.6 lb

## 2019-03-12 DIAGNOSIS — E1169 Type 2 diabetes mellitus with other specified complication: Secondary | ICD-10-CM | POA: Diagnosis not present

## 2019-03-12 DIAGNOSIS — I10 Essential (primary) hypertension: Secondary | ICD-10-CM | POA: Diagnosis not present

## 2019-03-12 DIAGNOSIS — E782 Mixed hyperlipidemia: Secondary | ICD-10-CM

## 2019-03-12 DIAGNOSIS — E1159 Type 2 diabetes mellitus with other circulatory complications: Secondary | ICD-10-CM

## 2019-03-12 LAB — BASIC METABOLIC PANEL: Glucose: 99

## 2019-03-12 MED ORDER — METFORMIN HCL ER 500 MG PO TB24: ORAL_TABLET | ORAL | 0 refills | Status: DC

## 2019-03-12 NOTE — Patient Instructions (Signed)

## 2019-03-12 NOTE — Progress Notes (Signed)
Telehealth office visit note for Dominique Brock, D.O- at [facility] at [facility]   I connected with current patient today and verified that I am speaking with the correct person using two identifiers.   . Location of the patient: Home . Location of the provider: Office Only the patient (+/- their family members at pt's discretion) and myself were participating in the encounter - This visit type was conducted due to national recommendations for restrictions regarding the COVID-19 Pandemic (e.g. social distancing) in an effort to limit this patient's exposure and mitigate transmission in our community.  This format is felt to be most appropriate for this patient at this time.   - No physical exam could be performed with this format, beyond that communicated to [place] by the patient/ family members as noted.   - Additionally my office staff/ schedulers discussed with the patient that there may be a monetary charge related to this service, depending on their medical insurance.   The patient expressed understanding, and agreed to proceed.       History of Present Illness: Hypertension and Diabetes   I, Dominique Brock, am serving as scribe for Dr. [name].  Notes she's been "tired."  "Not really feeling good on either the metformin or the blood pressure medication, one or the other."  Says "upping the blood pressure and starting the metformin has not been fun."  Notes she has been "extremely tired and sleeping a lot, even though I feel that my numbers are not that high."  HPI:   Diabetes Mellitus:  Home glucose readings: States her fasting blood sugar runs from 82 to low 100's.  Her sugar postprandial after dinner has been around 130.   Patient began with whole tablet of metformin instead of weaning up dose.  Notes with her levothyroxine, "there wasn't a twelve hour window, and my largest meal would be lunch and dinner, and I didn't know if taking it that close together  would work."  Notes "taking it at lunch seems to work better on my blood sugar numbers than waiting all day and taking it at dinner."  She saw a dietician the last time she did this, she wasn't on metformin and trying to manage her blood sugar with diet, and was told she could have thirty carbs at breakfast, thirty carbs at lunch, and thirty carbs at dinner.  On the metformin, notes she's felt full and hasn't been snacking.  Occasionally, a couple of days this week, she snacked.  States she started taking a half-tablet at lunch today.  Notes feeling less exhausted today.  Says she went to bed last night at 8:30 after feeling exhausted after eating baked chicken and baked zucchini.  - She denies new concerns.  Denies polyuria/polydipsia, hypo/ hyperglycemia symptoms.  Denies new onset of: chest pain, exercise intolerance, shortness of breath, dizziness, visual changes, headache, lower extremity swelling or claudication.   Last A1C in the office was:  Lab Results  Component Value Date   HGBA1C 7.2 (H) 02/26/2019   HGBA1C 6.3 (H) 09/13/2018   HGBA1C 6.1 03/02/2018   Lab Results  Component Value Date   MICROALBUR 2.7 (H) 12/06/2016   LDLCALC 62 09/13/2018   CREATININE 0.90 02/27/2019   BP Readings from Last 3 Encounters:  03/12/19 117/83  01/14/19 (!) 145/85  01/01/19 (!) 143/87   Wt Readings from Last 3 Encounters:  03/12/19 148 lb 9.6 oz (67.4 kg)  01/14/19 152 lb 12.8 oz (69.3  kg)  01/01/19 150 lb (68 kg)   HPI:  Hypertension:  -  Her blood pressure at home has been running: denies low BP's at home; states it's been 103/71 on Sunday when she felt bad; "the day before that, it was 113/79," and 126/81.  "It just looked like it kept dropping, when I started taking it on the 18th."  Started at 128/82, and got down to 116/78.   Per patient, her BP has normalized to around 120/80 regularly, on 100 mg losartan daily.  - Patient reports good compliance with medication and/or  lifestyle modification  - Her denies acute concerns or problems related to treatment plan  - She denies new onset of: chest pain, exercise intolerance, shortness of breath, dizziness, visual changes, headache, lower extremity swelling or claudication.   Last 3 blood pressure readings in our office are as follows: BP Readings from Last 3 Encounters:  03/12/19 117/83  01/14/19 (!) 145/85  01/01/19 (!) 143/87   Filed Weights   03/12/19 1332  Weight: 148 lb 9.6 oz (67.4 kg)         GAD 7 : Generalized Anxiety Score 03/01/2019 12/26/2018 07/04/2018  Nervous, Anxious, on Edge 0 0 1  Control/stop worrying 1 0 0  Worry too much - different things 1 0 0  Trouble relaxing 0 0 1  Restless 0 0 0  Easily annoyed or irritable 3 0 0  Afraid - awful might happen 0 0 0  Total GAD 7 Score 5 0 2  Anxiety Difficulty Not difficult at all - Not difficult at all    Depression screen [facility] 2/9 03/12/2019 03/01/2019 12/26/2018 07/04/2018 03/13/2018  Decreased Interest 0 1 1 0 0  Down, Depressed, Hopeless 0 1 0 0 0  PHQ - 2 Score 0 2 1 0 0  Altered sleeping 1 0 0 0 0  Tired, decreased energy 3 2 3  0 0  Change in appetite 0 2 0 1 0  Feeling bad or failure about yourself  0 0 0 0 0  Trouble concentrating 0 0 0 0 0  Moving slowly or fidgety/restless 0 0 0 0 0  Suicidal thoughts 0 0 0 0 0  PHQ-9 Score 4 6 4 1  0  Difficult doing work/chores Very difficult Not difficult at all Not difficult at all - Not difficult at all      Impression and Recommendations:    1. Type 2 diabetes mellitus with other specified complication, without long-term current use of insulin ([place])   2. Hypertension associated with diabetes ([place])   3. Mixed diabetic hyperlipidemia associated with type 2 diabetes mellitus ([place])      - Last seen 03/01/2019. - Began 500 mg metformin twice daily with meals for new onset diabetes   Diabetes Mellitus - Reviewed patient's treatment plan and symptoms during appointment today. -  Blood sugars stable and at goal at this time. - Discussed goal fasting and postprandial blood sugars with patient today.  - Discontinue 12-hour dose of metformin. - Begin extended release metformin.  See med list.  - Discussed that metformin will help curb appetite and assist with weight loss.  - Counseled patient on pathophysiology of disease and discussed various treatment options, which always includes dietary and lifestyle modification as first line.    - Discussed that patient may be referred to dietician again if desired. - Ambulatory referral to nutritionist provided today.  - Importance of low carb, heart-healthy diet discussed with patient in addition to regular aerobic exercise  of 64min 5d/week or more.   - Check FBS and 2 hours after the biggest meal of your day.  Keep log and bring in next OV for my review.     - Also told patient if you ever feel poorly, please check your blood pressure and blood sugar, as one or the other could be the cause of your symptoms.  - Pt reminded about need for yearly eye and foot exams.  Told patient to make appt.for diabetic eye exam, CMAs here will do foot exams  - Handouts provided at patient's desire and or told to go online at the American Diabetes Association website for further information  - We will continue to monitor and re-check A1c as discussed.   Hypertension associated with DM - Last OV, increased dose of losartan from 50 to 100. - Discussed goal BP of under 120/80.  - Discussed that patient's BP is stable, running at goal WNL.  Reviewed that patient may be adjusting to the increased dose of medicine.  - Continue current treatment regimen.  - Counseled patient on pathophysiology of disease and discussed various treatment options, which always includes dietary and lifestyle modification as first line.   - Prudent lifestyle changes such as dash and heart healthy diets and engaging in a regular exercise program discussed  extensively with patient.   - Ambulatory blood pressure monitoring encouraged at least 3 times weekly.  Keep log and bring in every office visit.  Reminded patient that if they ever feel poorly in any way, to check their blood pressure and pulse.  - Handouts provided at patient's desire and/or told to go online at the [place] website for further information  - We will continue to monitor.  Patient knows to call in with further concerns PRN.  Recommendations - Follow up in mid April for labs and f/up as scheduled.   - As part of my medical decision making, I reviewed the following data within the [place] History obtained from pt /family, CMA notes reviewed and incorporated if applicable, Labs reviewed, Radiograph/ tests reviewed if applicable and OV notes from prior OV's with me, as well as other specialists she/he has seen since seeing me last, were all reviewed and used in my medical decision making process today.    - Additionally, discussion had with patient regarding our treatment plan, and their biases/concerns about that plan were used in my medical decision making today.    - The patient agreed with the plan and demonstrated an understanding of the instructions.   No barriers to understanding were identified.    - Red flag symptoms and signs discussed in detail.  Patient expressed understanding regarding what to do in case of emergency\  urgent symptoms.   - The patient was advised to call back or seek an in-person evaluation if the symptoms worsen or if the condition fails to improve as anticipated.   Return for f/up OV around mid April with fasting labs prior as scheduled.  Lab orders are already in the chart.   Orders Placed This Encounter  Procedures  . Amb Referral to Nutrition and Diabetic E    Meds ordered this encounter  Medications  . metFORMIN (GLUCOPHAGE XR) 500 MG 24 hr tablet    Sig: 1 po qd    Dispense:  90 tablet     Refill:  0    Medications Discontinued During This Encounter  Medication Reason  . metFORMIN (GLUCOPHAGE) 500 MG tablet Dose change  I provided 20+ minutes of non face-to-face time during this encounter.  Additional time was spent with charting and coordination of care before and after the actual visit commenced.   Note:  This note was prepared with assistance of Dragon voice recognition software. Occasional wrong-word or sound-a-like substitutions may have occurred due to the inherent limitations of voice recognition software.  This document serves as a record of services personally performed by Dominique Dance, DO. It was created on her behalf by Dominique Brock, a trained medical scribe. The creation of this record is based on the scribe's personal observations and the provider's statements to them.   This case required medical decision making of at least moderate complexity. The above documentation has been reviewed to be accurate and was completed by Dominique Brock, D.O.       Patient Care Team    Relationship Specialty Notifications Start End  Dominique Dance, DO PCP - General Family Medicine  01/15/18   Bobbitt, [place] Muta, MD Consulting Physician Allergy and Immunology  01/15/18   Maisie Fus, MD Consulting Physician Obstetrics and Gynecology  01/15/18   Teodoro Spray, OD  Optometry  03/01/19    Comment: does yrly Diabetic Retinopathy exams     -Vitals obtained; medications/ allergies reconciled;  personal medical, social, Sx etc.histories were updated by CMA, reviewed by me and are reflected in chart   Patient Active Problem List   Diagnosis Date Noted  . Diabetes mellitus ([place]) 03/01/2019  . Mixed diabetic hyperlipidemia associated with type 2 diabetes mellitus ([place]) 03/01/2019  . Hypertension associated with diabetes ([place]) 01/15/2018  . Gastroesophageal reflux disease 01/15/2018  . Hypothyroidism 02/05/2016  . Moderate persistent asthma 02/17/2007    . Obstructive sleep apnea, hx of 02/17/2007  . Chronic sinusitis 09/19/2016  . Environmental and seasonal allergies 06/15/2016  . Anemia 06/15/2016  . Vitamin D deficiency 03/01/2019  . 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 Perennial allergic rhinitis 09/19/2016  . Rhinitis medicamentosa 09/19/2016  . 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 Eczema 06/15/2016  . EPISTAXIS, RECURRENT 03/24/2009  . BRONCHITIS, ACUTE 10/31/2008  . Sinusitis, chronic 02/29/2008  . ABDOMINAL PAIN, RIGHT UPPER QUADRANT 06/29/2007  . LIPOMA OF UNSPECIFIED SITE 06/12/2007  . SYNCOPE, VASOVAGAL 06/12/2007     Current Meds  Medication Sig  . atenolol (TENORMIN) 25 MG tablet Take 1 tablet (25 mg total) by mouth daily.  Marland Kitchen atorvastatin (LIPITOR) 40 MG tablet Take 1 tablet (40 mg total) by mouth at bedtime.  . Azelastine-Fluticasone 137-50 MCG/ACT SUSP 1 spray each nostril twice daily following sinus rinse  . cholecalciferol (VITAMIN D3) 25 MCG (1000 UT) tablet Take 1,000 Units by mouth daily.  Marland Kitchen levothyroxine (SYNTHROID, LEVOTHROID) 100 MCG tablet Patient states she is taking 1/2 tablet once daily  . losartan (COZAAR) 100 MG tablet Take 1 tablet (100 mg total) by mouth daily. No rf until OV  . Multiple Vitamin (MULTIVITAMIN) tablet Take 1 tablet by mouth daily.  Marland Kitchen omeprazole (PRILOSEC) 20 MG capsule Take 1 capsule (20 mg total) by mouth daily.  . [DISCONTINUED] metFORMIN (GLUCOPHAGE) 500 MG tablet Take 1 tablet (500 mg total) by mouth 2 (two) times daily with a meal. (Patient taking differently: Take 500 mg by mouth 2 (two) times daily with a meal. 1/2 tablet 2 times daily-see office note)     Allergies:  Allergies  Allergen Reactions  . Azithromycin Rash    REACTION: rash  . Codeine Nausea And Vomiting  . Penicillins Rash  .  Shellfish Allergy Rash     ROS:  See above HPI for pertinent positives and negatives   Objective:   Blood pressure 117/83, height 5\' 5"  (1.651 m), weight 148 lb 9.6  oz (67.4 kg).  (if some vitals are omitted, this means that patient was UNABLE to obtain them even though they were asked to get them prior to OV today.  They were asked to call us at their earliest convenience with these once obtained. )  General: A & O * 3; sounds in no acute distress; in usual state of health.  Skin: Pt confirms warm and dry extremities and pink fingertips HEENT: Pt confirms lips non-cyanotic Chest: Patient confirms normal chest excursion and movement Respiratory: speaking in full sentences, no conversational dyspnea; patient confirms no use of accessory muscles Psych: insight appears good, mood- appears full

## 2019-03-13 ENCOUNTER — Ambulatory Visit: Payer: 59 | Admitting: Family Medicine

## 2019-04-08 ENCOUNTER — Telehealth: Payer: Self-pay | Admitting: Plastic Surgery

## 2019-04-08 NOTE — Telephone Encounter (Signed)

## 2019-04-08 NOTE — Progress Notes (Signed)
ICD-10-CM   1. Lipoma of left upper extremity  D17.22       Patient ID: Dominique Brock, female    DOB: 1955/09/02, 64 y.o.   MRN: [id number]   History of Present Illness: Dominique Brock is a 64 y.o.  female  with a history of soft mass in the left arm in the axillary area.  She presents for preoperative evaluation for upcoming procedure, excision of left arm mass/lipoma, scheduled for 04/18/2019 with Dr. Marla Roe.  Summary from previous visit: Patient has a large mass on her left upper arm and the left axillary area.  An ultrasound was done followed by an MR.  Appears that it is not involving the muscle and is a straightforward lipoma.  Patient's hemoglobin A1c has been elevated this past year and she was started on Metformin.  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   The patient has not had problems with anesthesia.   Past Medical History: Allergies: Allergies  Allergen Reactions  . Azithromycin Rash    REACTION: rash  . Codeine Nausea And Vomiting  . Penicillins Rash  . Metronidazole Diarrhea  . Shellfish Allergy Rash    Current Medications:  Current Outpatient Medications:  .  atenolol (TENORMIN) 25 MG tablet, Take 1 tablet (25 mg total) by mouth daily., Disp: 90 tablet, Rfl: 1 .  atorvastatin (LIPITOR) 40 MG tablet, Take 1 tablet (40 mg total) by mouth at bedtime., Disp: 90 tablet, Rfl: 0 .  Azelastine-Fluticasone 137-50 MCG/ACT SUSP, 1 spray each nostril twice daily following sinus rinse, Disp: 1 Bottle, Rfl: 5 .  cholecalciferol (VITAMIN D3) 25 MCG (1000 UT) tablet, Take 1,000 Units by mouth daily., Disp: , Rfl:  .  levothyroxine (SYNTHROID, LEVOTHROID) 100 MCG tablet, Patient states she is taking 1/2 tablet once daily, Disp: , Rfl: 2 .  losartan (COZAAR) 100 MG tablet, Take 1 tablet (100 mg total) by mouth daily. No rf until OV, Disp: 90 tablet, Rfl: 0 .  metFORMIN (GLUCOPHAGE XR) 500 MG 24 hr tablet, 1 po qd,  Disp: 90 tablet, Rfl: 0 .  Multiple Vitamin (MULTIVITAMIN) tablet, Take 1 tablet by mouth daily., Disp: , Rfl:  .  omeprazole (PRILOSEC) 20 MG capsule, Take 1 capsule (20 mg total) by mouth daily., Disp: 90 capsule, Rfl: 3  Past Medical Problems: Past Medical History:  Diagnosis Date  . Asthma   . Diabetes mellitus    prediabetic diet controlled  . Eczema 06/15/2016  . Hypertension   . Sleep apnea    only with allergies  . Thyroid disease     Past Surgical History: Past Surgical History:  Procedure Laterality Date  . ABDOMINAL HYSTERECTOMY    . BILATERAL OOPHORECTOMY    . SINUS EXPLORATION  1996    Social History: Social History   Socioeconomic History  . Marital status: Married    Spouse name: Not on file  . Number of children: 2  . Years of education: Not on file  . Highest education level: Not on file  Occupational History  . Not on file  Tobacco Use  . Smoking status: Never Smoker  . Smokeless tobacco: Never Used  Substance and Sexual Activity  . Alcohol use: Yes    Comment: less than once a month  . Drug use: No  . Sexual activity: Yes  Other Topics Concern  . Not on file  Social History Narrative  . Not on file   Social  Determinants of Health   Financial Resource Strain:   . Difficulty of Paying Living Expenses: Not on file  Food Insecurity:   . Worried About [place] fund[place] in the Last Year: Not on file  . Ran Out of Food in the Last Year: Not on file  Transportation Needs:   . Lack of Transportation (Medical): Not on file  . Lack of Transportation (Non-Medical): Not on file  Physical Activity:   . Days of Exercise per Week: Not on file  . Minutes of Exercise per Session: Not on file  Stress:   . Feeling of Stress : Not on file  Social Connections:   . Frequency of Communication with Friends and Family: Not on file  . Frequency of Social Gatherings with Friends and Family: Not on file  . Attends Religious Services: Not on file  . Active  Member of Clubs or Organizations: Not on file  . Attends Archivist Meetings: Not on file  . Marital Status: Not on file  Intimate Partner Violence:   . Fear of Current or Ex-Partner: Not on file  . Emotionally Abused: Not on file  . Physically Abused: Not on file  . Sexually Abused: Not on file    Family History: Family History  Problem Relation Age of Onset  . Hypertension Mother   . Hyperlipidemia Mother   . Liver cancer Mother 105       died in 2011/05/10, at age 1 or 61.  . Allergies Mother   . Diabetes Mother 71       onset "mid fifties."  . Healthy Father   . Dementia Father 3       not officialy diagnosed  . Healthy Other        2 siblings  . Asthma Maternal Grandmother   . Diabetes Maternal Grandmother   . Asthma Cousin   . Allergic rhinitis Neg Hx   . Angioedema Neg Hx   . Eczema Neg Hx   . Immunodeficiency Neg Hx   . Urticaria Neg Hx     Review of Systems: Review of Systems  Constitutional: Negative for chills and fever.  HENT: Negative for congestion and sore throat.   Respiratory: Negative for cough and shortness of breath.   Cardiovascular: Negative for chest pain and palpitations.  Gastrointestinal: Negative for abdominal pain, nausea and vomiting.  Musculoskeletal: Negative for back pain, joint pain, myalgias and neck pain.       Mass on left arm, can cause discomfort  Skin: Negative for itching and rash.    Physical Exam: Vital Signs BP 113/71 (BP Location: Left Arm, Patient Position: Sitting, Cuff Size: Normal)   Pulse (!) 59   Temp 99.3 F (37.4 C) (Temporal)   Ht 5\' 5"  (1.651 m)   Wt 146 lb 12.8 oz (66.6 kg)   SpO2 99%   BMI 24.43 kg/m  Physical Exam Constitutional:      Appearance: Normal appearance. She is normal weight.  HENT:     Head: Normocephalic and atraumatic.  Eyes:     Extraocular Movements: Extraocular movements intact.  Cardiovascular:     Rate and Rhythm: Normal rate and regular rhythm.     Pulses: Normal  pulses.     Heart sounds: Normal heart sounds.  Pulmonary:     Effort: Pulmonary effort is normal.     Breath sounds: Normal breath sounds. No wheezing, rhonchi or rales.  Abdominal:     General: Bowel sounds are normal.  Palpations: Abdomen is soft.  Musculoskeletal:        General: No swelling. Normal range of motion.     Cervical back: Normal range of motion.     Comments: Mass on upper left arm  Skin:    General: Skin is warm and dry.     Coloration: Skin is not pale.     Findings: No erythema or rash.  Neurological:     General: No focal deficit present.     Mental Status: She is alert and oriented to person, place, and time.  Psychiatric:        Mood and Affect: Mood normal.        Behavior: Behavior normal.        Thought Content: Thought content normal.        Judgment: Judgment normal.     Assessment/Plan:  Ms. Norina Buzzard scheduled for excision of left arm mass/lipoma with Dr. Marla Roe.  Risks, benefits, and alternatives of procedure discussed, questions answered and consent obtained.    Smoking Status: non-smoker Allergy to shellfish.  Caprini Score: 4 moderate; Risk Factors include: 64 yr-old female, HRT, and length of planned surgery. Recommendation for mechanical or pharmacological prophylaxis during surgery. Encourage early ambulation.   Pictures obtained: 01/01/19  Post-op Rx sent to pharmacy: Ultram, Cipro, zofran  The 21st Century Cures Act was signed into law in 2016 which includes the topic of electronic health records.  This provides immediate access to information in MyChart.  This includes consultation notes, operative notes, office notes, lab results and pathology reports.  If you have any questions about what you read please let us know at your next visit or call us at the office.  We are right here with you.     Electronically signed by: Threasa Heads, PA-C 04/09/2019 2:29 PM

## 2019-04-08 NOTE — H&P (View-Only) (Signed)
ICD-10-CM   1. Lipoma of left upper extremity  D17.22       Patient ID: Dominique Brock, female    DOB: Mar 04, 1955, 64 y.o.   MRN: [id number]   History of Present Illness: Dominique Brock is a 64 y.o.  female  with a history of soft mass in the left arm in the axillary area.  She presents for preoperative evaluation for upcoming procedure, excision of left arm mass/lipoma, scheduled for 04/18/2019 with Dr. Marla Roe.  Summary from previous visit: Patient has a large mass on her left upper arm and the left axillary area.  An ultrasound was done followed by an MR.  Appears that it is not involving the muscle and is a straightforward lipoma.  Patient's hemoglobin A1c has been elevated this past year and she was started on Metformin.  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   The patient has not had problems with anesthesia.   Past Medical History: Allergies: Allergies  Allergen Reactions   Azithromycin Rash    REACTION: rash   Codeine Nausea And Vomiting   Penicillins Rash   Metronidazole Diarrhea   Shellfish Allergy Rash    Current Medications:  Current Outpatient Medications:    atenolol (TENORMIN) 25 MG tablet, Take 1 tablet (25 mg total) by mouth daily., Disp: 90 tablet, Rfl: 1   atorvastatin (LIPITOR) 40 MG tablet, Take 1 tablet (40 mg total) by mouth at bedtime., Disp: 90 tablet, Rfl: 0   Azelastine-Fluticasone 137-50 MCG/ACT SUSP, 1 spray each nostril twice daily following sinus rinse, Disp: 1 Bottle, Rfl: 5   cholecalciferol (VITAMIN D3) 25 MCG (1000 UT) tablet, Take 1,000 Units by mouth daily., Disp: , Rfl:    levothyroxine (SYNTHROID, LEVOTHROID) 100 MCG tablet, Patient states she is taking 1/2 tablet once daily, Disp: , Rfl: 2   losartan (COZAAR) 100 MG tablet, Take 1 tablet (100 mg total) by mouth daily. No rf until OV, Disp: 90 tablet, Rfl: 0   metFORMIN (GLUCOPHAGE XR) 500 MG 24 hr tablet, 1 po qd,  Disp: 90 tablet, Rfl: 0   Multiple Vitamin (MULTIVITAMIN) tablet, Take 1 tablet by mouth daily., Disp: , Rfl:    omeprazole (PRILOSEC) 20 MG capsule, Take 1 capsule (20 mg total) by mouth daily., Disp: 90 capsule, Rfl: 3  Past Medical Problems: Past Medical History:  Diagnosis Date   Asthma    Diabetes mellitus    prediabetic diet controlled   Eczema 06/15/2016   Hypertension    Sleep apnea    only with allergies   Thyroid disease     Past Surgical History: Past Surgical History:  Procedure Laterality Date   ABDOMINAL HYSTERECTOMY     BILATERAL OOPHORECTOMY     SINUS EXPLORATION  1996    Social History: Social History   Socioeconomic History   Marital status: Married    Spouse name: Not on file   Number of children: 2   Years of education: Not on file   Highest education level: Not on file  Occupational History   Not on file  Tobacco Use   Smoking status: Never Smoker   Smokeless tobacco: Never Used  Substance and Sexual Activity   Alcohol use: Yes    Comment: less than once a month   Drug use: No   Sexual activity: Yes  Other Topics Concern   Not on file  Social History Narrative   Not on file   Social  Determinants of Health   Financial Resource Strain:    Difficulty of Paying Living Expenses: Not on file  Food Insecurity:    Worried About [place] in the Last Year: Not on file   Ran Out of Food in the Last Year: Not on file  Transportation Needs:    Lack of Transportation (Medical): Not on file   Lack of Transportation (Non-Medical): Not on file  Physical Activity:    Days of Exercise per Week: Not on file   Minutes of Exercise per Session: Not on file  Stress:    Feeling of Stress : Not on file  Social Connections:    Frequency of Communication with Friends and Family: Not on file   Frequency of Social Gatherings with Friends and Family: Not on file   Attends Religious Services: Not on file   Active  Member of Clubs or Organizations: Not on file   Attends Archivist Meetings: Not on file   Marital Status: Not on file  Intimate Partner Violence:    Fear of Current or Ex-Partner: Not on file   Emotionally Abused: Not on file   Physically Abused: Not on file   Sexually Abused: Not on file    Family History: Family History  Problem Relation Age of Onset   Hypertension Mother    Hyperlipidemia Mother    Liver cancer Mother 39       died in 2011/05/25, at age 73 or 75.   Allergies Mother    Diabetes Mother 40       onset "mid 43."   Healthy Father    Dementia Father 78       not officialy diagnosed   Healthy Other        2 siblings   Asthma Maternal Grandmother    Diabetes Maternal Grandmother    Asthma Cousin    Allergic rhinitis Neg Hx    Angioedema Neg Hx    Eczema Neg Hx    Immunodeficiency Neg Hx    Urticaria Neg Hx     Review of Systems: Review of Systems  Constitutional: Negative for chills and fever.  HENT: Negative for congestion and sore throat.   Respiratory: Negative for cough and shortness of breath.   Cardiovascular: Negative for chest pain and palpitations.  Gastrointestinal: Negative for abdominal pain, nausea and vomiting.  Musculoskeletal: Negative for back pain, joint pain, myalgias and neck pain.       Mass on left arm, can cause discomfort  Skin: Negative for itching and rash.    Physical Exam: Vital Signs BP 113/71 (BP Location: Left Arm, Patient Position: Sitting, Cuff Size: Normal)    Pulse (!) 59    Temp 99.3 F (37.4 C) (Temporal)    Ht 5\' 5"  (1.651 m)    Wt 146 lb 12.8 oz (66.6 kg)    SpO2 99%    BMI 24.43 kg/m  Physical Exam Constitutional:      Appearance: Normal appearance. She is normal weight.  HENT:     Head: Normocephalic and atraumatic.  Eyes:     Extraocular Movements: Extraocular movements intact.  Cardiovascular:     Rate and Rhythm: Normal rate and regular rhythm.     Pulses: Normal  pulses.     Heart sounds: Normal heart sounds.  Pulmonary:     Effort: Pulmonary effort is normal.     Breath sounds: Normal breath sounds. No wheezing, rhonchi or rales.  Abdominal:     General:  Bowel sounds are normal.     Palpations: Abdomen is soft.  Musculoskeletal:        General: No swelling. Normal range of motion.     Cervical back: Normal range of motion.     Comments: Mass on upper left arm  Skin:    General: Skin is warm and dry.     Coloration: Skin is not pale.     Findings: No erythema or rash.  Neurological:     General: No focal deficit present.     Mental Status: She is alert and oriented to person, place, and time.  Psychiatric:        Mood and Affect: Mood normal.        Behavior: Behavior normal.        Thought Content: Thought content normal.        Judgment: Judgment normal.     Assessment/Plan:  Ms. Norina Buzzard scheduled for excision of left arm mass/lipoma with Dr. Marla Roe.  Risks, benefits, and alternatives of procedure discussed, questions answered and consent obtained.    Smoking Status: non-smoker Allergy to shellfish.  Caprini Score: 4 moderate; Risk Factors include: 64 yr-old female, HRT, and length of planned surgery. Recommendation for mechanical or pharmacological prophylaxis during surgery. Encourage early ambulation.   Pictures obtained: 01/01/19  Post-op Rx sent to pharmacy: Ultram, Cipro, zofran  The 21st Century Cures Act was signed into law in 2016 which includes the topic of electronic health records.  This provides immediate access to information in MyChart.  This includes consultation notes, operative notes, office notes, lab results and pathology reports.  If you have any questions about what you read please let us know at your next visit or call us at the office.  We are right here with you.     Electronically signed by: Threasa Heads, PA-C 04/09/2019 2:29 PM

## 2019-04-09 ENCOUNTER — Other Ambulatory Visit: Payer: Self-pay

## 2019-04-09 ENCOUNTER — Encounter: Payer: Self-pay | Admitting: Plastic Surgery

## 2019-04-09 ENCOUNTER — Ambulatory Visit (INDEPENDENT_AMBULATORY_CARE_PROVIDER_SITE_OTHER): Payer: 59 | Admitting: Plastic Surgery

## 2019-04-09 VITALS — BP 113/71 | HR 59 | Temp 99.3°F | Ht 65.0 in | Wt 146.8 lb

## 2019-04-09 DIAGNOSIS — D1722 Benign lipomatous neoplasm of skin and subcutaneous tissue of left arm: Secondary | ICD-10-CM

## 2019-04-09 MED ORDER — ONDANSETRON HCL 4 MG PO TABS: 4.0000 mg | ORAL_TABLET | Freq: Three times a day (TID) | ORAL | 0 refills | Status: DC | PRN

## 2019-04-09 MED ORDER — TRAMADOL HCL 50 MG PO TABS: 50.0000 mg | ORAL_TABLET | Freq: Three times a day (TID) | ORAL | 0 refills | Status: AC | PRN

## 2019-04-09 MED ORDER — CIPROFLOXACIN HCL 500 MG PO TABS: 500.0000 mg | ORAL_TABLET | Freq: Two times a day (BID) | ORAL | 0 refills | Status: AC

## 2019-04-11 ENCOUNTER — Other Ambulatory Visit: Payer: Self-pay

## 2019-04-11 ENCOUNTER — Encounter (HOSPITAL_BASED_OUTPATIENT_CLINIC_OR_DEPARTMENT_OTHER): Payer: Self-pay | Admitting: Plastic Surgery

## 2019-04-15 ENCOUNTER — Other Ambulatory Visit (HOSPITAL_COMMUNITY)
Admission: RE | Admit: 2019-04-15 | Discharge: 2019-04-15 | Disposition: A | Discharge status: Home / Self Care | Payer: 59 | Source: Ambulatory Visit | Attending: Plastic Surgery | Admitting: Plastic Surgery

## 2019-04-15 DIAGNOSIS — Z01812 Encounter for preprocedural laboratory examination: Secondary | ICD-10-CM | POA: Diagnosis not present

## 2019-04-15 DIAGNOSIS — Z20822 Contact with and (suspected) exposure to covid-19: Secondary | ICD-10-CM | POA: Diagnosis not present

## 2019-04-16 ENCOUNTER — Encounter (HOSPITAL_BASED_OUTPATIENT_CLINIC_OR_DEPARTMENT_OTHER)
Admission: RE | Admit: 2019-04-16 | Discharge: 2019-04-16 | Disposition: A | Discharge status: Home / Self Care | Payer: 59 | Source: Ambulatory Visit | Attending: Plastic Surgery | Admitting: Plastic Surgery

## 2019-04-16 DIAGNOSIS — Z79890 Hormone replacement therapy (postmenopausal): Secondary | ICD-10-CM | POA: Diagnosis not present

## 2019-04-16 DIAGNOSIS — Z79899 Other long term (current) drug therapy: Secondary | ICD-10-CM | POA: Diagnosis not present

## 2019-04-16 DIAGNOSIS — I10 Essential (primary) hypertension: Secondary | ICD-10-CM | POA: Diagnosis not present

## 2019-04-16 DIAGNOSIS — E119 Type 2 diabetes mellitus without complications: Secondary | ICD-10-CM | POA: Diagnosis not present

## 2019-04-16 DIAGNOSIS — D1722 Benign lipomatous neoplasm of skin and subcutaneous tissue of left arm: Secondary | ICD-10-CM | POA: Diagnosis present

## 2019-04-16 DIAGNOSIS — K219 Gastro-esophageal reflux disease without esophagitis: Secondary | ICD-10-CM | POA: Diagnosis not present

## 2019-04-16 DIAGNOSIS — E039 Hypothyroidism, unspecified: Secondary | ICD-10-CM | POA: Diagnosis not present

## 2019-04-16 DIAGNOSIS — G4730 Sleep apnea, unspecified: Secondary | ICD-10-CM | POA: Diagnosis not present

## 2019-04-16 LAB — BASIC METABOLIC PANEL
Anion gap: 9 (ref 5–15)
BUN: 22 mg/dL (ref 8–23)
CO2: 23 mmol/L (ref 22–32)
Calcium: 9.6 mg/dL (ref 8.9–10.3)
Chloride: 106 mmol/L (ref 98–111)
Creatinine, Ser: 0.82 mg/dL (ref 0.44–1.00)
GFR calc Af Amer: >60 mL/min (ref >60)
GFR calc non Af Amer: >60 mL/min (ref >60)
Glucose, Bld: 85 mg/dL (ref 70–99)
Potassium: 5.3 mmol/L — ABNORMAL HIGH (ref 3.5–5.1)
Sodium: 138 mmol/L (ref 135–145)

## 2019-04-16 LAB — SARS CORONAVIRUS 2 (TAT 6-24 HRS): SARS Coronavirus 2: NEGATIVE

## 2019-04-17 ENCOUNTER — Telehealth: Payer: Self-pay | Admitting: Family Medicine

## 2019-04-17 MED ORDER — LOSARTAN POTASSIUM-HCTZ 50-12.5 MG PO TABS: 1.0000 | ORAL_TABLET | Freq: Every day | ORAL | 0 refills | Status: DC

## 2019-04-17 NOTE — Telephone Encounter (Signed)
I have already spoken with patient. Please see other note. AS, CMA

## 2019-04-17 NOTE — Addendum Note (Signed)
Addended by: Mickel Crow on: 04/17/2019 02:08 PM   Modules accepted: Orders

## 2019-04-17 NOTE — Telephone Encounter (Signed)
Pt called states she is still waiting to hear from med asst re: approval for upcoming procedure.  -glh

## 2019-04-17 NOTE — Progress Notes (Addendum)
Potassium of 5.3 reviewed by Dr. Kalman Shan and stated will proceed with surgery as scheduled.

## 2019-04-17 NOTE — Telephone Encounter (Signed)
Forwarding message to Dr. Marla Roe per Dr. Raliegh Scarlet request. AS, CMA

## 2019-04-17 NOTE — Telephone Encounter (Signed)
I saw her back on 03/01/2019,, her blood pressures were running in the upper 140s the past 2 office visits prior to that.  She not been checking her blood pressures at home.  Hence, we increased her losartan from 50 to 100.    Since her potassium is now elevated slightly at 5.3, this is most likely due to medication as this is a potassium sparing drug.  However, if the surgeon and/or anesthesiologist is not comfortable proceeding with a potassium 5.3, we absolutely can make modifications to her medications however, this would mean she would have to put off her surgery a bit longer.  Please see if patient is okay with this  Recommend we decrease the losartan from 100 down to 50 and add HCTZ 12.5.  Hence losartan-HCTZ 50-12 0.51 p.o. daily dispense 90 no refill.  She can continue her atenolol at the current dose.  That is what I would recommend however, it will take time to normalize that potassium and hence, she would have to put off her surgery if surgeon and anesthesia is not comfortable with that potassium

## 2019-04-17 NOTE — Telephone Encounter (Signed)
Contains abnormal data Basic metabolic panel Order: [id number] Status:  Final result  Visible to patient:  Yes (MyChart)  Next appt:  04/26/2019 at 08:30 AM in Plastic [facility] Leo S Dillingham, DO)  Ref Range & Units 1 d ago  Sodium 135 - 145 mmol/L 138   Potassium 3.5 - 5.1 mmol/L 5.3High    Chloride 98 - 111 mmol/L 106   CO2 22 - 32 mmol/L 23   Glucose, Bld 70 - 99 mg/dL 85   Comment: Glucose reference range applies only to samples taken after fasting for at least 8 hours.  BUN 8 - 23 mg/dL 22   Creatinine, Ser 0.44 - 1.00 mg/dL 0.82   Calcium 8.9 - 10.3 mg/dL 9.6   GFR calc non Af Amer >60 mL/min >60   GFR calc Af Amer >60 mL/min >60   Anion gap 5 - 15 9   Comment: Performed at [facility] [address], [place], [place] [address]  Resulting Agency  [facility]      Specimen Collected: 04/16/19 14:44  Last Resulted: 04/16/19 15:25     Lab Flowsheet   Order Details   View Encounter   Lab and Collection Details   Routing   Result History       Result Communications  Result Notes  [place], RN  04/17/2019 10:28 AM EST    Per Dr. Marla Roe- I forwarded the lab results to Dr. Heber [place] who assessed the pt's history & found that her PCP had increased her Losartan & thinks that this can be the reason for the elevation of potassium I then called the pt to discuss the findings & she in formed me that her last Metabolic panel/labs were taken on 03/02/2018- potassium level was 4.6 She did indicate that she reported an elevated BP to her PCP- & she then increased the Losartan dose.  BP has remained at approx. 116/83- & last BP taken here was recorded 113/73 on 04/09/19 Pt reports that she is not having any chest pain, SOB, or any other difficulties. I informed her that anesthesia/surgery center will most likely perform labs tomorrow prior to her surgery & they will make a determination regarding the findings.  Dr. Heber [place] and Dr. Baltazar Apo suggested that pt  contact her PCP today- to see if they suggest any change in her medications & to arrange for further evaluation of these findings Pt understands & will contact her PCP - she will then call me to update oyur office on further recommendations        Basic metabolic panel: Result Notes  [place], RN  04/17/2019 10:28 AM EST    Per Dr. Marla Roe- I forwarded the lab results to Dr. Heber [place] who assessed the pt's history & found that her PCP had increased her Losartan & thinks that this can be the reason for the elevation of potassium I then called the pt to discuss the findings & she in formed me that her last Metabolic panel/labs were taken on 03/02/2018- potassium level was 4.6 She did indicate that she reported an elevated BP to her PCP- & she then increased the Losartan dose.  BP has remained at approx. 116/83- & last BP taken here was recorded 113/73 on 04/09/19 Pt reports that she is not having any chest pain, SOB, or any other difficulties. I informed her that anesthesia/surgery center will most likely perform labs tomorrow prior to her surgery & they will make a determination regarding the findings.  Dr.  Hoffman and Dr. Baltazar Apo suggested that pt contact her PCP today- to see if they suggest any change in her medications & to arrange for further evaluation of these findings Pt understands & will contact her PCP - she will then call me to update oyur office on further recommendations    Per medication list patient is currently taking:   From [place]- Omeprazole 20mg  1 QD Metformin 500mg  1 QD Losartan 100mg  1 QD Atorvastatin 40mg  1 QD Atenolol 25mg  1 QD  Azelastine-fluticasone 137/61mcg  From historical provider- Levothyroxine 171mcg 1/2 QD Vitamin D3 1000IU 1 QD  Please review and advise what you would like done. AS, CMA

## 2019-04-17 NOTE — Telephone Encounter (Signed)
Patient is aware of the information below.  Patient agreed to discontinue Losartan 100mg  QD and to start Losartan/HCTZ 50-12.5 QD.  90 day supply sent to patient pharmacy. Patient is aware to make contact with surgeon and discuss whether they can move forward or not with surgery. AS, CMA

## 2019-04-17 NOTE — Telephone Encounter (Signed)
Patient was advised to call PCP office by her specialist. They recently did a BMP and her potassium is elevated, and the specialist advised patient it maybe because of the meds she is on. She is requesting a call back to see if there needs to be any change to her treatment plan.

## 2019-04-18 ENCOUNTER — Encounter (HOSPITAL_BASED_OUTPATIENT_CLINIC_OR_DEPARTMENT_OTHER)
Admission: RE | Disposition: A | Discharge status: Home / Self Care | Payer: Self-pay | Source: Home / Self Care | Attending: Plastic Surgery

## 2019-04-18 ENCOUNTER — Other Ambulatory Visit: Payer: Self-pay

## 2019-04-18 ENCOUNTER — Telehealth: Payer: Self-pay

## 2019-04-18 ENCOUNTER — Encounter (HOSPITAL_BASED_OUTPATIENT_CLINIC_OR_DEPARTMENT_OTHER): Payer: Self-pay | Admitting: Plastic Surgery

## 2019-04-18 ENCOUNTER — Ambulatory Visit (HOSPITAL_BASED_OUTPATIENT_CLINIC_OR_DEPARTMENT_OTHER): Payer: 59 | Admitting: Anesthesiology

## 2019-04-18 ENCOUNTER — Ambulatory Visit (HOSPITAL_BASED_OUTPATIENT_CLINIC_OR_DEPARTMENT_OTHER)
Admission: RE | Admit: 2019-04-18 | Discharge: 2019-04-18 | Disposition: A | Discharge status: Home / Self Care | Payer: 59 | Attending: Plastic Surgery | Admitting: Plastic Surgery

## 2019-04-18 DIAGNOSIS — E039 Hypothyroidism, unspecified: Secondary | ICD-10-CM | POA: Insufficient documentation

## 2019-04-18 DIAGNOSIS — Z79890 Hormone replacement therapy (postmenopausal): Secondary | ICD-10-CM | POA: Insufficient documentation

## 2019-04-18 DIAGNOSIS — Z79899 Other long term (current) drug therapy: Secondary | ICD-10-CM | POA: Insufficient documentation

## 2019-04-18 DIAGNOSIS — K219 Gastro-esophageal reflux disease without esophagitis: Secondary | ICD-10-CM | POA: Insufficient documentation

## 2019-04-18 DIAGNOSIS — D1722 Benign lipomatous neoplasm of skin and subcutaneous tissue of left arm: Secondary | ICD-10-CM | POA: Insufficient documentation

## 2019-04-18 DIAGNOSIS — I10 Essential (primary) hypertension: Secondary | ICD-10-CM | POA: Insufficient documentation

## 2019-04-18 DIAGNOSIS — G4730 Sleep apnea, unspecified: Secondary | ICD-10-CM | POA: Insufficient documentation

## 2019-04-18 DIAGNOSIS — E119 Type 2 diabetes mellitus without complications: Secondary | ICD-10-CM | POA: Insufficient documentation

## 2019-04-18 HISTORY — DX: Hypothyroidism, unspecified: E03.9

## 2019-04-18 HISTORY — PX: EXCISION MASS UPPER EXTREMETIES: SHX6704

## 2019-04-18 SURGERY — EXCISION MASS UPPER EXTREMITIES
Anesthesia: General | Site: Arm Upper | Laterality: Left

## 2019-04-18 MED ORDER — FENTANYL CITRATE (PF) 100 MCG/2ML IJ SOLN
50.0000 ug | INTRAMUSCULAR | Status: DC | PRN
  Administered 2019-04-18: 100 ug via INTRAVENOUS

## 2019-04-18 MED ORDER — ONDANSETRON HCL 4 MG/2ML IJ SOLN
INTRAMUSCULAR | Status: AC
  Filled 2019-04-18: qty 2

## 2019-04-18 MED ORDER — PROMETHAZINE HCL 25 MG/ML IJ SOLN: 6.2500 mg | INTRAMUSCULAR | Status: DC | PRN

## 2019-04-18 MED ORDER — CIPROFLOXACIN IN D5W 400 MG/200ML IV SOLN
INTRAVENOUS | Status: AC
  Filled 2019-04-18: qty 200

## 2019-04-18 MED ORDER — LIDOCAINE-EPINEPHRINE 1 %-1:100000 IJ SOLN
INTRAMUSCULAR | Status: DC | PRN
  Administered 2019-04-18: 15 mL

## 2019-04-18 MED ORDER — FENTANYL CITRATE (PF) 100 MCG/2ML IJ SOLN
INTRAMUSCULAR | Status: AC
  Filled 2019-04-18: qty 2

## 2019-04-18 MED ORDER — CHLORHEXIDINE GLUCONATE CLOTH 2 % EX PADS: 6.0000 | MEDICATED_PAD | Freq: Once | CUTANEOUS | Status: DC

## 2019-04-18 MED ORDER — BACITRACIN ZINC 500 UNIT/GM EX OINT
TOPICAL_OINTMENT | CUTANEOUS | Status: AC
  Filled 2019-04-18: qty 28.35

## 2019-04-18 MED ORDER — PROPOFOL 10 MG/ML IV BOLUS
INTRAVENOUS | Status: DC | PRN
  Administered 2019-04-18: 150 mg via INTRAVENOUS

## 2019-04-18 MED ORDER — MIDAZOLAM HCL 2 MG/2ML IJ SOLN
INTRAMUSCULAR | Status: AC
  Filled 2019-04-18: qty 2

## 2019-04-18 MED ORDER — OXYCODONE HCL 5 MG PO TABS: 5.0000 mg | ORAL_TABLET | Freq: Once | ORAL | Status: DC | PRN

## 2019-04-18 MED ORDER — ACETAMINOPHEN 325 MG PO TABS: 650.0000 mg | ORAL_TABLET | ORAL | Status: DC | PRN

## 2019-04-18 MED ORDER — OXYCODONE HCL 5 MG/5ML PO SOLN: 5.0000 mg | Freq: Once | ORAL | Status: DC | PRN

## 2019-04-18 MED ORDER — LIDOCAINE-EPINEPHRINE 1 %-1:100000 IJ SOLN
INTRAMUSCULAR | Status: AC
  Filled 2019-04-18: qty 1

## 2019-04-18 MED ORDER — PHENYLEPHRINE 40 MCG/ML (10ML) SYRINGE FOR IV PUSH (FOR BLOOD PRESSURE SUPPORT)
PREFILLED_SYRINGE | INTRAVENOUS | Status: AC
  Filled 2019-04-18: qty 10

## 2019-04-18 MED ORDER — MIDAZOLAM HCL 2 MG/2ML IJ SOLN
1.0000 mg | INTRAMUSCULAR | Status: DC | PRN
  Administered 2019-04-18: 2 mg via INTRAVENOUS

## 2019-04-18 MED ORDER — ACETAMINOPHEN 650 MG RE SUPP: 650.0000 mg | RECTAL | Status: DC | PRN

## 2019-04-18 MED ORDER — SODIUM CHLORIDE 0.9% FLUSH: 3.0000 mL | Freq: Two times a day (BID) | INTRAVENOUS | Status: DC

## 2019-04-18 MED ORDER — EPHEDRINE 5 MG/ML INJ
INTRAVENOUS | Status: AC
  Filled 2019-04-18: qty 10

## 2019-04-18 MED ORDER — FENTANYL CITRATE (PF) 100 MCG/2ML IJ SOLN: 25.0000 ug | INTRAMUSCULAR | Status: DC | PRN

## 2019-04-18 MED ORDER — CIPROFLOXACIN IN D5W 400 MG/200ML IV SOLN
400.0000 mg | INTRAVENOUS | Status: AC
  Administered 2019-04-18: 400 mg via INTRAVENOUS

## 2019-04-18 MED ORDER — DEXAMETHASONE SODIUM PHOSPHATE 4 MG/ML IJ SOLN
INTRAMUSCULAR | Status: DC | PRN
  Administered 2019-04-18: 4 mg via INTRAVENOUS

## 2019-04-18 MED ORDER — DEXAMETHASONE SODIUM PHOSPHATE 10 MG/ML IJ SOLN
INTRAMUSCULAR | Status: AC
  Filled 2019-04-18: qty 1

## 2019-04-18 MED ORDER — SODIUM CHLORIDE 0.9 % IV SOLN: 250.0000 mL | INTRAVENOUS | Status: DC | PRN

## 2019-04-18 MED ORDER — SUCCINYLCHOLINE CHLORIDE 200 MG/10ML IV SOSY
PREFILLED_SYRINGE | INTRAVENOUS | Status: AC
  Filled 2019-04-18: qty 10

## 2019-04-18 MED ORDER — LEVOTHYROXINE SODIUM 100 MCG PO TABS
100.0000 ug | ORAL_TABLET | Freq: Once | ORAL | Status: AC
  Administered 2019-04-18: 50 ug via ORAL
  Filled 2019-04-18 (×2): qty 1

## 2019-04-18 MED ORDER — PROPOFOL 500 MG/50ML IV EMUL
INTRAVENOUS | Status: AC
  Filled 2019-04-18: qty 50

## 2019-04-18 MED ORDER — SODIUM CHLORIDE 0.9% FLUSH: 3.0000 mL | INTRAVENOUS | Status: DC | PRN

## 2019-04-18 MED ORDER — ONDANSETRON HCL 4 MG/2ML IJ SOLN
INTRAMUSCULAR | Status: DC | PRN
  Administered 2019-04-18: 4 mg via INTRAVENOUS

## 2019-04-18 MED ORDER — LACTATED RINGERS IV SOLN: INTRAVENOUS | Status: DC

## 2019-04-18 MED ORDER — LIDOCAINE 2% (20 MG/ML) 5 ML SYRINGE
INTRAMUSCULAR | Status: DC | PRN
  Administered 2019-04-18: 80 mg via INTRAVENOUS

## 2019-04-18 MED ORDER — EPHEDRINE SULFATE 50 MG/ML IJ SOLN
INTRAMUSCULAR | Status: DC | PRN
  Administered 2019-04-18: 10 mg via INTRAVENOUS

## 2019-04-18 MED ORDER — LIDOCAINE 2% (20 MG/ML) 5 ML SYRINGE
INTRAMUSCULAR | Status: AC
  Filled 2019-04-18: qty 5

## 2019-04-18 MED ORDER — PROPOFOL 10 MG/ML IV BOLUS
INTRAVENOUS | Status: AC
  Filled 2019-04-18: qty 20

## 2019-04-18 SURGICAL SUPPLY — 75 items
ADH SKN CLS APL DERMABOND .7 (GAUZE/BANDAGES/DRESSINGS)
APL SKNCLS STERI-STRIP NONHPOA (GAUZE/BANDAGES/DRESSINGS)
BAND INSRT 18 STRL LF DISP RB (MISCELLANEOUS)
BAND RUBBER #18 3X1/16 STRL (MISCELLANEOUS) IMPLANT
BENZOIN TINCTURE PRP APPL 2/3 (GAUZE/BANDAGES/DRESSINGS) IMPLANT
BLADE CLIPPER SURG (BLADE) IMPLANT
BLADE SURG 15 STRL LF DISP TIS (BLADE) ×1 IMPLANT
BLADE SURG 15 STRL SS (BLADE) ×3
BNDG CONFORM 2 STRL LF (GAUZE/BANDAGES/DRESSINGS) IMPLANT
BNDG ELASTIC 2X5.8 VLCR STR LF (GAUZE/BANDAGES/DRESSINGS) IMPLANT
CANISTER SUCT 1200ML W/VALVE (MISCELLANEOUS) IMPLANT
CLEANER CAUTERY TIP 5X5 PAD (MISCELLANEOUS) IMPLANT
CLOSURE STERI-STRIP 1/2X4 (GAUZE/BANDAGES/DRESSINGS) ×1
CLOSURE WOUND 1/2 X4 (GAUZE/BANDAGES/DRESSINGS)
CLSR STERI-STRIP ANTIMIC 1/2X4 (GAUZE/BANDAGES/DRESSINGS) ×1 IMPLANT
CORD BIPOLAR FORCEPS 12FT (ELECTRODE) IMPLANT
COVER BACK TABLE 60X90IN (DRAPES) ×3 IMPLANT
COVER MAYO STAND STRL (DRAPES) ×3 IMPLANT
COVER WAND RF STERILE (DRAPES) IMPLANT
DECANTER SPIKE VIAL GLASS SM (MISCELLANEOUS) IMPLANT
DERMABOND ADVANCED (GAUZE/BANDAGES/DRESSINGS)
DERMABOND ADVANCED .7 DNX12 (GAUZE/BANDAGES/DRESSINGS) IMPLANT
DRAPE LAPAROTOMY 100X72 PEDS (DRAPES) IMPLANT
DRAPE U-SHAPE 76X120 STRL (DRAPES) IMPLANT
DRSG MEPILEX BORDER 4X4 (GAUZE/BANDAGES/DRESSINGS) ×2 IMPLANT
DRSG TEGADERM 2-3/8X2-3/4 SM (GAUZE/BANDAGES/DRESSINGS) IMPLANT
DRSG TEGADERM 4X4.75 (GAUZE/BANDAGES/DRESSINGS) IMPLANT
ELECT COATED BLADE 2.86 ST (ELECTRODE) IMPLANT
ELECT NDL BLADE 2-5/6 (NEEDLE) IMPLANT
ELECT NEEDLE BLADE 2-5/6 (NEEDLE) IMPLANT
ELECT REM PT RETURN 9FT ADLT (ELECTROSURGICAL)
ELECT REM PT RETURN 9FT PED (ELECTROSURGICAL)
ELECTRODE REM PT RETRN 9FT PED (ELECTROSURGICAL) IMPLANT
ELECTRODE REM PT RTRN 9FT ADLT (ELECTROSURGICAL) IMPLANT
GAUZE SPONGE 2X2 12PLY NS (GAUZE/BANDAGES/DRESSINGS) ×2 IMPLANT
GAUZE SPONGE 4X4 12PLY STRL LF (GAUZE/BANDAGES/DRESSINGS) IMPLANT
GLOVE BIO SURGEON STRL SZ 6.5 (GLOVE) ×4 IMPLANT
GLOVE BIO SURGEONS STRL SZ 6.5 (GLOVE) ×2
GOWN STRL REUS W/ TWL LRG LVL3 (GOWN DISPOSABLE) ×2 IMPLANT
GOWN STRL REUS W/TWL LRG LVL3 (GOWN DISPOSABLE) ×6
NDL HYPO 30GX1 BEV (NEEDLE) IMPLANT
NDL PRECISIONGLIDE 27X1.5 (NEEDLE) ×1 IMPLANT
NEEDLE HYPO 30GX1 BEV (NEEDLE) IMPLANT
NEEDLE PRECISIONGLIDE 27X1.5 (NEEDLE) ×3 IMPLANT
NS IRRIG 1000ML POUR BTL (IV SOLUTION) IMPLANT
PACK BASIN DAY SURGERY FS (CUSTOM PROCEDURE TRAY) ×3 IMPLANT
PAD CLEANER CAUTERY TIP 5X5 (MISCELLANEOUS)
PENCIL SMOKE EVACUATOR (MISCELLANEOUS) IMPLANT
SHEET MEDIUM DRAPE 40X70 STRL (DRAPES) IMPLANT
SLEEVE SCD COMPRESS KNEE MED (MISCELLANEOUS) IMPLANT
SPONGE GAUZE 2X2 8PLY STER LF (GAUZE/BANDAGES/DRESSINGS)
SPONGE GAUZE 2X2 8PLY STRL LF (GAUZE/BANDAGES/DRESSINGS) IMPLANT
SPONGE LAP 18X18 RF (DISPOSABLE) ×2 IMPLANT
STRIP CLOSURE SKIN 1/2X4 (GAUZE/BANDAGES/DRESSINGS) IMPLANT
SUCTION FRAZIER HANDLE 10FR (MISCELLANEOUS)
SUCTION TUBE FRAZIER 10FR DISP (MISCELLANEOUS) IMPLANT
SUT MNCRL 6-0 UNDY P1 1X18 (SUTURE) IMPLANT
SUT MNCRL AB 3-0 PS2 18 (SUTURE) IMPLANT
SUT MNCRL AB 4-0 PS2 18 (SUTURE) ×2 IMPLANT
SUT MON AB 5-0 P3 18 (SUTURE) IMPLANT
SUT MON AB 5-0 PS2 18 (SUTURE) ×2 IMPLANT
SUT MONOCRYL 6-0 P1 1X18 (SUTURE)
SUT PROLENE 5 0 P 3 (SUTURE) IMPLANT
SUT PROLENE 5 0 PS 2 (SUTURE) IMPLANT
SUT PROLENE 6 0 P 1 18 (SUTURE) IMPLANT
SUT VIC AB 5-0 P-3 18X BRD (SUTURE) IMPLANT
SUT VIC AB 5-0 P3 18 (SUTURE)
SUT VIC AB 5-0 PS2 18 (SUTURE) IMPLANT
SUT VICRYL 4-0 PS2 18IN ABS (SUTURE) IMPLANT
SYR BULB 3OZ (MISCELLANEOUS) IMPLANT
SYR CONTROL 10ML LL (SYRINGE) ×3 IMPLANT
TOWEL GREEN STERILE FF (TOWEL DISPOSABLE) ×3 IMPLANT
TRAY DSU PREP LF (CUSTOM PROCEDURE TRAY) ×3 IMPLANT
TUBE CONNECTING 20'X1/4 (TUBING)
TUBE CONNECTING 20X1/4 (TUBING) IMPLANT

## 2019-04-18 NOTE — Telephone Encounter (Signed)
Pt returned call to [facility], she reports that her PCP- Dr. Raliegh Scarlet was notified of the potassium level & she & the pt will look at adjusting the Losartan dose.  Pt is aware that another set of labs will be obtained tomorrow am prior to her surgery. Dr. Marla Roe and Dr. Heber [place] are both aware of the plan of care

## 2019-04-18 NOTE — Op Note (Signed)
DATE OF OPERATION: 04/18/2019  LOCATION: [facility]  PREOPERATIVE DIAGNOSIS: Left arm lipoma  POSTOPERATIVE DIAGNOSIS: Same  PROCEDURE: Excision of left arm lipoma 6 x 10 cm  SURGEON: Pernell Lenoir [employer], DO  ASSISTANT: Phoebe Sharps, PA  EBL: 2 cc  CONDITION: Stable  COMPLICATIONS: None  INDICATION: The patient, Dominique Brock, is a 64 y.o. female born on 1955-07-26, is here for treatment of a left upper arm mass that was consistent with a lipoma.   PROCEDURE DETAILS:  The patient was seen prior to surgery and marked.  The IV antibiotics were given. The patient was taken to the operating room and given a general anesthetic. A standard time out was performed and all information was confirmed by those in the room. SCDs were placed.   The patient was prepped and draped.  Local was injected for intraoperative hemostasis and postoperative pain control.  The #15 blade was used to make an incision over the mass for 3 cm.  The bovie was used to dissect to the mass.  It was consistent with a lipoma.  It was 6 x 10 cm.  It was deep and tracked under the biceps tendon.  It was removed bluntly to prevent any injury to the vessels or nerves.  Hemostasis was achieved with the local and bovie at the tissue edge.  The deep layer was closed with the 4-0 Monocryl.  The skin was closed with the 5-0 Monocryl.  Derma bond and steri strips were placed.  The patient was allowed to wake up and taken to recovery room in stable condition at the end of the case. The family was notified at the end of the case.   The advanced practice practitioner (APP) assisted throughout the case.  The APP was essential in retraction and counter traction when needed to make the case progress smoothly.  This retraction and assistance made it possible to see the tissue plans for the procedure.  The assistance was needed for blood control, tissue re-approximation and assisted with closure of the incision site.  The  [place] was signed into law in 2016 which includes the topic of electronic health records.  This provides immediate access to information in MyChart.  This includes consultation notes, operative notes, office notes, lab results and pathology reports.  If you have any questions about what you read please let us know at your next visit or call us at the office.  We are right here with you.

## 2019-04-18 NOTE — Anesthesia Procedure Notes (Signed)
Procedure Name: LMA Insertion Date/Time: 04/18/2019 11:58 AM Performed by: Willa Frater, CRNA Pre-anesthesia Checklist: Patient identified, Emergency Drugs available, Suction available and Patient being monitored Patient Re-evaluated:Patient Re-evaluated prior to induction Oxygen Delivery Method: Circle system utilized Preoxygenation: Pre-oxygenation with 100% oxygen Induction Type: IV induction Ventilation: Mask ventilation without difficulty LMA: LMA inserted LMA Size: 3.0 Number of attempts: 1 Airway Equipment and Method: Bite block Placement Confirmation: positive ETCO2 Tube secured with: Tape Dental Injury: Teeth and Oropharynx as per pre-operative assessment

## 2019-04-18 NOTE — Interval H&P Note (Signed)
History and Physical Interval Note:  04/18/2019 11:25 AM  Dominique Brock  has presented today for surgery, with the diagnosis of lipoma of left arm.  The various methods of treatment have been discussed with the patient and family. After consideration of risks, benefits and other options for treatment, the patient has consented to  Procedure(s): Excision of left arm mass / lipoma (Left) as a surgical intervention.  The patient's history has been reviewed, patient examined, no change in status, stable for surgery.  I have reviewed the patient's chart and labs.  Questions were answered to the patient's satisfaction.     Loel Lofty Cuyler Vandyken

## 2019-04-18 NOTE — Transfer of Care (Signed)
Immediate Anesthesia Transfer of Care Note  Patient: Dominique Brock  Procedure(s) Performed: Excision of left arm mass / lipoma (Left Arm Upper)  Patient Location: PACU  Anesthesia Type:General  Level of Consciousness: drowsy  Airway & Oxygen Therapy: Patient Spontanous Breathing and Patient connected to face mask oxygen  Post-op Assessment: Report given to RN and Post -op Vital signs reviewed and stable  Post vital signs: Reviewed and stable  Last Vitals:  Vitals Value Taken Time  BP 135/69 04/18/19 1251  Temp    Pulse 82 04/18/19 1255  Resp 11 04/18/19 1255  SpO2 100 % 04/18/19 1255  Vitals shown include unvalidated device data.  Last Pain:  Vitals:   04/18/19 1024  TempSrc: Oral  PainSc: 0-No pain      Patients Stated Pain Goal: 0 ([id number])  Complications: No apparent anesthesia complications

## 2019-04-18 NOTE — Anesthesia Preprocedure Evaluation (Addendum)
Anesthesia Evaluation  Patient identified by MRN, date of birth, ID band Patient awake    Reviewed: Allergy & Precautions, NPO status , Patient's Chart, lab work & pertinent test results, reviewed documented beta blocker date and time   History of Anesthesia Complications Negative for: history of anesthetic complications  Airway Mallampati: II  TM Distance: >3 FB Neck ROM: Full    Dental  (+) Dental Advisory Given, Caps   Pulmonary asthma , sleep apnea ,    Pulmonary exam normal        Cardiovascular hypertension, Pt. on medications and Pt. on home beta blockers Normal cardiovascular exam     Neuro/Psych negative neurological ROS  negative psych ROS   GI/Hepatic Neg liver ROS, GERD  Medicated and Controlled,  Endo/Other  diabetesHypothyroidism  Prediabetic K 5.3 two days ago    Renal/GU negative Renal ROS     Musculoskeletal negative musculoskeletal ROS (+)   Abdominal   Peds  Hematology  (+) anemia ,   Anesthesia Other Findings Covid neg 04/15/19   Reproductive/Obstetrics  S/p hysterectomy                             Anesthesia Physical Anesthesia Plan  ASA: II  Anesthesia Plan: General   Post-op Pain Management:    Induction: Intravenous  PONV Risk Score and Plan: 3 and Treatment may vary due to age or medical condition, Ondansetron and Midazolam  Airway Management Planned: LMA  Additional Equipment: None  Intra-op Plan:   Post-operative Plan: Extubation in OR  Informed Consent: I have reviewed the patients History and Physical, chart, labs and discussed the procedure including the risks, benefits and alternatives for the proposed anesthesia with the patient or authorized representative who has indicated his/her understanding and acceptance.     Dental advisory given  Plan Discussed with: CRNA and Anesthesiologist  Anesthesia Plan Comments:         Anesthesia Quick Evaluation

## 2019-04-18 NOTE — Discharge Instructions (Signed)
INSTRUCTIONS FOR AFTER SURGERY   You will likely have some questions about what to expect following your operation.  The following information will help you and your family understand what to expect when you are discharged from the hospital.  Following these guidelines will help ensure a smooth recovery and reduce risks of complications.  Postoperative instructions include information on: diet, wound care, medications and physical activity.  AFTER SURGERY Expect to go home after the procedure.  In some cases, you may need to spend one night in the hospital for observation.  DIET This surgery does not require a specific diet.  However, I have to mention that the healthier you eat the better your body can start healing. It is important to increasing your protein intake.  This means limiting the foods with added sugar.  Focus on fruits and vegetables and some meat.  If you have any liposuction during your procedure be sure to drink water.  If your urine is bright yellow, then it is concentrated, and you need to drink more water.  As a general rule after surgery, you should have 8 ounces of water every hour while awake.  If you find you are persistently nauseated or unable to take in liquids let us know.  NO TOBACCO USE or EXPOSURE.  This will slow your healing process and increase the risk of a wound.  WOUND CARE If you don't have a drain: You can shower the day after surgery.  Use fragrance free soap.  Dial, [place], [place] and Cetaphil are usually mild on the skin.  If you have steri-strips / tape directly attached to your skin leave them in place. It is OK to get these wet.  No baths, pools or hot tubs for two weeks. We close your incision to leave the smallest and best-looking scar. No ointment or creams on your incisions until given the go ahead.  Especially not Neosporin (Too many skin reactions with this one).  A few weeks after surgery you can use Mederma and start massaging the scar.  ACTIVITY No  heavy lifting until cleared by the doctor.  It is OK to walk and climb stairs. In fact, moving your legs is very important to decrease your risk of a blood clot.  It will also help keep you from getting deconditioned.  Every 1 to 2 hours get up and walk for 5 minutes. This will help with a quicker recovery back to normal.  Let pain be your guide so you don't do too much.  NO, you cannot do the spring cleaning and don't plan on taking care of anyone else.  This is your time for TLC.   WORK Everyone returns to work at different times. As a rough guide, most people take at least 1 - 2 weeks off prior to returning to work. If you need documentation for your job, bring the forms to your postoperative follow up visit.  DRIVING Arrange for someone to bring you home from the hospital.  You may be able to drive a few days after surgery but not while taking any narcotics or valium.  BOWEL MOVEMENTS Constipation can occur after anesthesia and while taking pain medication.  It is important to stay ahead for your comfort.  We recommend taking Milk of Magnesia (2 tablespoons; twice a day) while taking the pain pills.  SEROMA This is fluid your body tried to put in the surgical site.  This is normal but if it creates excessive pain and swelling let us  know.  It usually decreases in a few weeks.  MEDICATIONS and PAIN CONTROL At your preoperative visit for you history and physical you were given the following medications: 1. An antibiotic: Start this medication when you get home and take according to the instructions on the bottle. 2. Zofran 4 mg:  This is to treat nausea and vomiting.  You can take this every 6 hours as needed and only if needed. 3. Norco (hydrocodone/acetaminophen) 5/325 mg:  This is only to be used after you have taken the motrin or the tylenol. Every 8 hours as needed. Over the counter Medication to take: 4. Ibuprofen (Motrin) 600 mg:  Take this every 6 hours.  If you have additional pain  then take 500 mg of the tylenol.  Only take the Norco after you have tried these two. 5. Miralax or stool softener of choice: Take this according to the bottle if you take the [place] Call your surgeon's office if any of the following occur:  Fever 101 degrees F or greater  Excessive bleeding or fluid from the incision site.  Pain that increases over time without aid from the medications  Redness, warmth, or pus draining from incision sites  Persistent nausea or inability to take in liquids  Severe misshapen area that underwent the operation.   Post Anesthesia Home Care Instructions  Activity: Get plenty of rest for the remainder of the day. A responsible individual must stay with you for 24 hours following the procedure.  For the next 24 hours, DO NOT: -Drive a car -Paediatric nurse -Drink alcoholic beverages -Take any medication unless instructed by your physician -Make any legal decisions or sign important papers.  Meals: Start with liquid foods such as gelatin or soup. Progress to regular foods as tolerated. Avoid greasy, spicy, heavy foods. If nausea and/or vomiting occur, drink only clear liquids until the nausea and/or vomiting subsides. Call your physician if vomiting continues.  Special Instructions/Symptoms: Your throat may feel dry or sore from the anesthesia or the breathing tube placed in your throat during surgery. If this causes discomfort, gargle with warm salt water. The discomfort should disappear within 24 hours.  If you had a scopolamine patch placed behind your ear for the management of post- operative nausea and/or vomiting:  1. The medication in the patch is effective for 72 hours, after which it should be removed.  Wrap patch in a tissue and discard in the trash. Wash hands thoroughly with soap and water. 2. You may remove the patch earlier than 72 hours if you experience unpleasant side effects which may include dry mouth, dizziness or  visual disturbances. 3. Avoid touching the patch. Wash your hands with soap and water after contact with the patch.

## 2019-04-18 NOTE — Anesthesia Postprocedure Evaluation (Signed)
Anesthesia Post Note  Patient: Dominique Brock  Procedure(s) Performed: Excision of left arm mass / lipoma (Left Arm Upper)     Patient location during evaluation: PACU Anesthesia Type: General Level of consciousness: awake and alert Pain management: pain level controlled Vital Signs Assessment: post-procedure vital signs reviewed and stable Respiratory status: spontaneous breathing, nonlabored ventilation and respiratory function stable Cardiovascular status: blood pressure returned to baseline and stable Postop Assessment: no apparent nausea or vomiting Anesthetic complications: no    Last Vitals:  Vitals:   04/18/19 1315 04/18/19 1322  BP: 135/76   Pulse: 75 77  Resp: 14 14  Temp:    SpO2: 100% 99%    Last Pain:  Vitals:   04/18/19 1315  TempSrc:   PainSc: 0-No pain                 Audry Pili

## 2019-04-19 LAB — SURGICAL PATHOLOGY

## 2019-04-26 ENCOUNTER — Ambulatory Visit (INDEPENDENT_AMBULATORY_CARE_PROVIDER_SITE_OTHER): Payer: 59 | Admitting: Plastic Surgery

## 2019-04-26 ENCOUNTER — Other Ambulatory Visit: Payer: Self-pay

## 2019-04-26 ENCOUNTER — Encounter: Payer: Self-pay | Admitting: Plastic Surgery

## 2019-04-26 VITALS — BP 132/76 | HR 52 | Temp 98.0°F | Ht 65.0 in | Wt 147.0 lb

## 2019-04-26 DIAGNOSIS — D1722 Benign lipomatous neoplasm of skin and subcutaneous tissue of left arm: Secondary | ICD-10-CM

## 2019-04-26 NOTE — Progress Notes (Signed)
The patient is a 64 year old female here for follow-up after undergoing excision of a left upper arm lipoma.  The excision was March 4.  The pathology showed a mature fibroadipose tissue consistent with a lipoma.  The patient has some bruising but full range of motion and excellent sensation.  She is healing very nicely.  We obtained some pictures and those are in the chart with the patient's permission.  We will see her back in 2 weeks.  She does not need to ice it but I encouraged her to continue taking some Motrin.

## 2019-05-09 NOTE — Progress Notes (Signed)
Patient is a 64 year old female here for follow-up after undergoing excision of a left upper arm lipoma on 04/18/2019 with Dr. Marla Roe.  Pathology showed mature fibroadipose tissue consistent with a lipoma.  Today she reports she's doing well.  Steri-strip and suture knots removed. Incision is healing well, c/d/i. No signs of infection, redness, drainage, seroma/hematoma. Patient denies HA, F, CP, SOB, N/V.   Follow up as needed. Call office with any questions/concerns.  The [place] was signed into law in 2016 which includes the topic of electronic health records.  This provides immediate access to information in MyChart.  This includes consultation notes, operative notes, office notes, lab results and pathology reports.  If you have any questions about what you read please let us know at your next visit or call us at the office.  We are right here with you.

## 2019-05-10 ENCOUNTER — Encounter: Payer: Self-pay | Admitting: Plastic Surgery

## 2019-05-10 ENCOUNTER — Other Ambulatory Visit: Payer: Self-pay

## 2019-05-10 ENCOUNTER — Ambulatory Visit (INDEPENDENT_AMBULATORY_CARE_PROVIDER_SITE_OTHER): Payer: 59 | Admitting: Plastic Surgery

## 2019-05-10 VITALS — BP 110/78 | HR 59 | Temp 97.7°F | Ht 65.0 in | Wt 147.0 lb

## 2019-05-10 DIAGNOSIS — D1722 Benign lipomatous neoplasm of skin and subcutaneous tissue of left arm: Secondary | ICD-10-CM

## 2019-05-27 ENCOUNTER — Other Ambulatory Visit: Payer: 59

## 2019-05-27 ENCOUNTER — Other Ambulatory Visit: Payer: Self-pay

## 2019-05-27 DIAGNOSIS — I152 Hypertension secondary to endocrine disorders: Secondary | ICD-10-CM

## 2019-05-27 DIAGNOSIS — E1169 Type 2 diabetes mellitus with other specified complication: Secondary | ICD-10-CM

## 2019-05-27 DIAGNOSIS — E782 Mixed hyperlipidemia: Secondary | ICD-10-CM

## 2019-05-27 DIAGNOSIS — E559 Vitamin D deficiency, unspecified: Secondary | ICD-10-CM

## 2019-05-27 DIAGNOSIS — Z79899 Other long term (current) drug therapy: Secondary | ICD-10-CM

## 2019-05-27 DIAGNOSIS — E1159 Type 2 diabetes mellitus with other circulatory complications: Secondary | ICD-10-CM

## 2019-05-28 LAB — LIPID PANEL
Chol/HDL Ratio: 2.5 ratio (ref 0.0–4.4)
Cholesterol, Total: 91 mg/dL — ABNORMAL LOW (ref 100–199)
HDL: 36 mg/dL — ABNORMAL LOW (ref >39)
LDL Chol Calc (NIH): 43 mg/dL (ref 0–99)
Triglycerides: 50 mg/dL (ref 0–149)
VLDL Cholesterol Cal: 12 mg/dL (ref 5–40)

## 2019-05-28 LAB — CBC WITH DIFFERENTIAL/PLATELET
Basophils Absolute: 0.1 10*3/uL (ref 0.0–0.2)
Basos: 1 %
EOS (ABSOLUTE): 0.2 10*3/uL (ref 0.0–0.4)
Eos: 3 %
Hematocrit: 43.4 % (ref 34.0–46.6)
Hemoglobin: 14.4 g/dL (ref 11.1–15.9)
Immature Grans (Abs): 0 10*3/uL (ref 0.0–0.1)
Immature Granulocytes: 0 %
Lymphocytes Absolute: 2.5 10*3/uL (ref 0.7–3.1)
Lymphs: 33 %
MCH: 32.1 pg (ref 26.6–33.0)
MCHC: 33.2 g/dL (ref 31.5–35.7)
MCV: 97 fL (ref 79–97)
Monocytes Absolute: 0.6 10*3/uL (ref 0.1–0.9)
Monocytes: 8 %
Neutrophils Absolute: 4.3 10*3/uL (ref 1.4–7.0)
Neutrophils: 55 %
Platelets: 216 10*3/uL (ref 150–450)
RBC: 4.48 x10E6/uL (ref 3.77–5.28)
RDW: 12.5 % (ref 11.7–15.4)
WBC: 7.7 10*3/uL (ref 3.4–10.8)

## 2019-05-28 LAB — COMPREHENSIVE METABOLIC PANEL
ALT: 34 IU/L — ABNORMAL HIGH (ref 0–32)
AST: 24 IU/L (ref 0–40)
Albumin/Globulin Ratio: 1.9 (ref 1.2–2.2)
Albumin: 3.9 g/dL (ref 3.8–4.8)
Alkaline Phosphatase: 66 IU/L (ref 39–117)
BUN/Creatinine Ratio: 19 (ref 12–28)
BUN: 18 mg/dL (ref 8–27)
Bilirubin Total: 0.3 mg/dL (ref 0.0–1.2)
CO2: 23 mmol/L (ref 20–29)
Calcium: 8.9 mg/dL (ref 8.7–10.3)
Chloride: 106 mmol/L (ref 96–106)
Creatinine, Ser: 0.93 mg/dL (ref 0.57–1.00)
GFR calc Af Amer: 76 mL/min/{1.73_m2} (ref >59)
GFR calc non Af Amer: 66 mL/min/{1.73_m2} (ref >59)
Globulin, Total: 2.1 g/dL (ref 1.5–4.5)
Glucose: 96 mg/dL (ref 65–99)
Potassium: 4.1 mmol/L (ref 3.5–5.2)
Sodium: 142 mmol/L (ref 134–144)
Total Protein: 6 g/dL (ref 6.0–8.5)

## 2019-05-28 LAB — HEMOGLOBIN A1C
Est. average glucose Bld gHb Est-mCnc: 126 mg/dL
Hgb A1c MFr Bld: 6 % — ABNORMAL HIGH (ref 4.8–5.6)

## 2019-05-28 LAB — MICROALBUMIN / CREATININE URINE RATIO
Creatinine, Urine: 237.8 mg/dL
Microalb/Creat Ratio: 6 mg/g creat (ref 0–29)
Microalbumin, Urine: 13.7 ug/mL

## 2019-05-28 LAB — MAGNESIUM: Magnesium: 2.2 mg/dL (ref 1.6–2.3)

## 2019-05-28 LAB — VITAMIN D 25 HYDROXY (VIT D DEFICIENCY, FRACTURES): Vit D, 25-Hydroxy: 85.3 ng/mL (ref 30.0–100.0)

## 2019-05-30 ENCOUNTER — Encounter: Payer: Self-pay | Admitting: Family Medicine

## 2019-05-30 ENCOUNTER — Other Ambulatory Visit: Payer: Self-pay

## 2019-05-30 ENCOUNTER — Ambulatory Visit (INDEPENDENT_AMBULATORY_CARE_PROVIDER_SITE_OTHER): Payer: 59 | Admitting: Family Medicine

## 2019-05-30 VITALS — BP 132/78 | Ht 65.0 in | Wt 140.0 lb

## 2019-05-30 DIAGNOSIS — Z79899 Other long term (current) drug therapy: Secondary | ICD-10-CM

## 2019-05-30 DIAGNOSIS — E1159 Type 2 diabetes mellitus with other circulatory complications: Secondary | ICD-10-CM | POA: Diagnosis not present

## 2019-05-30 DIAGNOSIS — E559 Vitamin D deficiency, unspecified: Secondary | ICD-10-CM

## 2019-05-30 DIAGNOSIS — E782 Mixed hyperlipidemia: Secondary | ICD-10-CM

## 2019-05-30 DIAGNOSIS — E1169 Type 2 diabetes mellitus with other specified complication: Secondary | ICD-10-CM

## 2019-05-30 DIAGNOSIS — I10 Essential (primary) hypertension: Secondary | ICD-10-CM

## 2019-05-30 MED ORDER — ATORVASTATIN CALCIUM 20 MG PO TABS: 20.0000 mg | ORAL_TABLET | Freq: Every day | ORAL | Status: DC

## 2019-05-30 MED ORDER — LOSARTAN POTASSIUM-HCTZ 50-12.5 MG PO TABS: 0.5000 | ORAL_TABLET | Freq: Every day | ORAL | 0 refills | Status: DC

## 2019-05-30 NOTE — Progress Notes (Signed)
Telehealth office visit note for Dominique Brock, D.O- at Primary Care at [facility]   I connected with current patient today and verified that I am speaking with the correct person   . Location of the patient: Home . Location of the provider: Office - This visit type was conducted due to national recommendations for restrictions regarding the COVID-19 Pandemic (e.g. social distancing) in an effort to limit this patient's exposure and mitigate transmission in our community.    - No physical exam could be performed with this format, beyond that communicated to [place] by the patient/ family members as noted.   - Additionally my office staff/ schedulers were to discuss with the patient that there may be a monetary charge related to this service, depending on their medical insurance.  My understanding is that patient understood and consented to proceed.     _________________________________________________________________________________   History of Present Illness:  I, [name], am serving as Education administrator for [employer].  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  She tries to get up and move ever hour, and tries to do more exercise on the weekends if she is able.  Notes at home, they've been cleaning out their basement and trying to do more tasks around the house.  She has gotten outside and walked some when the weather is good.  - Recent Dizziness, SOB, Tiredness while cleaning out basement Says while cleaning the basement recently, she got dizzy, and short of breath, and had to stop and rest.  Notes she felt "extremely tired" and wondered if it was due to her blood sugar or her blood pressure.  Her blood sugar was 116, and her blood pressure was 110/60.  She also attributed these symptoms to allergies, but was unsure if they were related to her blood pressure medications or something else.  She quit taking her  blood pressure medication in the morning and notes that the symptoms resolved after this.  - Vitamin D Deficiency She is currently taking around 4500 IU's per day.  HPI:   Diabetes Mellitus:  Home glucose readings:  Two hours after her biggest meal, her sugars are running 123, 115, 118.  She notes having difficulty "keeping it under 100" in the mornings.  Says she was keeping it down in the morning, but it's now up to 103, 112, 107, and sometimes 94, 95, 97.  Says she can eat the same thing two nights in a row, and her fasting sugars can be the same measurement.   Notes she took one metformin and that was it.  Says she was having diarrhea and couldn't get up off the couch, "so I just made up my mind that I was going to take care of it myself."  - Her denies acute concerns or problems related to treatment plan  - She denies new concerns.  Denies polyuria/polydipsia, hypo/ hyperglycemia symptoms.  Denies new onset of: chest pain, exercise intolerance, shortness of breath, dizziness, visual changes, headache, lower extremity swelling or claudication.   Last A1C in the office was:  Lab Results  Component Value Date   HGBA1C 6.0 (H) 05/27/2019   HGBA1C 7.2 (H) 02/26/2019   HGBA1C 6.3 (H) 09/13/2018   Lab Results  Component Value Date   MICROALBUR 2.7 (H) 12/06/2016   LDLCALC 43 05/27/2019   CREATININE 0.93 05/27/2019   BP Readings from Last 3  Encounters:  05/30/19 132/78  05/10/19 110/78  04/26/19 132/76   Wt Readings from Last 3 Encounters:  05/30/19 140 lb (63.5 kg)  05/10/19 147 lb (66.7 kg)  04/26/19 147 lb (66.7 kg)   HPI:  Hypertension:  -  Her blood pressure at home has been running: controlled on average.  She quit taking her blood pressure medication in the morning because she could not move without being SOB and dizzy, and these symptoms concerned her.  Notes "I had to sit down and rest before I could do anything, and I don't know what that is from."  Says since she  quit taking the blood pressure medication in the morning, she hasn't felt these symptoms.  - She denies new onset of: chest pain, exercise intolerance, shortness of breath, dizziness, visual changes, headache, lower extremity swelling or claudication.   Last 3 blood pressure readings in our office are as follows: BP Readings from Last 3 Encounters:  05/30/19 132/78  05/10/19 110/78  04/26/19 132/76   Filed Weights   05/30/19 0811  Weight: 140 lb (63.5 kg)   HPI:  Hyperlipidemia:  64 y.o. female here for cholesterol follow-up.   - Patient reports good compliance with treatment plan of:  medication and/ or lifestyle management.    - Patient denies any acute concerns or problems with management plan   - She denies new onset of: myalgias, arthralgias, increased fatigue more than normal, chest pains, exercise intolerance, shortness of breath, dizziness, visual changes, headache, lower extremity swelling or claudication.   Most recent cholesterol panel was:  Lab Results  Component Value Date   CHOL 91 (L) 05/27/2019   HDL 36 (L) 05/27/2019   LDLCALC 43 05/27/2019   TRIG 50 05/27/2019   CHOLHDL 2.5 05/27/2019   Hepatic Function Latest Ref Rng & Units 05/27/2019 09/13/2018 03/02/2018  Total Protein 6.0 - 8.5 g/dL 6.0 6.1 -  Albumin 3.8 - 4.8 g/dL 3.9 4.2 -  AST 0 - 40 IU/L _0 ALT 0 - 32 IU/L 34(H) 35(H) 20  Alk Phosphatase 39 - 117 IU/L 66 69 67  Total Bilirubin 0.0 - 1.2 mg/dL 0.3 0.5 -  Bilirubin, Direct 0.0 - 0.3 mg/dL - - -      GAD 7 : Generalized Anxiety Score 03/01/2019 12/26/2018 07/04/2018  Nervous, Anxious, on Edge 0 0 1  Control/stop worrying 1 0 0  Worry too much - different things 1 0 0  Trouble relaxing 0 0 1  Restless 0 0 0  Easily annoyed or irritable 3 0 0  Afraid - awful might happen 0 0 0  Total GAD 7 Score 5 0 2  Anxiety Difficulty Not difficult at all - Not difficult at all    Depression screen [facility] 2/9 05/30/2019 03/12/2019 03/01/2019 12/26/2018  07/04/2018  Decreased Interest 0 0 1 1 0  Down, Depressed, Hopeless 0 0 1 0 0  PHQ - 2 Score 0 0 2 1 0  Altered sleeping 3 1 0 0 0  Tired, decreased energy 0 _1 0  Change in appetite 0 0 2 0 1  Feeling bad or failure about yourself  0 0 0 0 0  Trouble concentrating 0 0 0 0 0  Moving slowly or fidgety/restless 0 0 0 0 0  Suicidal thoughts 0 0 0 0 0  PHQ-9 Score _2 Difficult doing work/chores Not difficult at all Very difficult Not difficult at all Not difficult at all -  Impression and Recommendations:     1. NEW ONSET- Type 2 diabetes mellitus with other specified complication, without long-term current use of insulin ([place])   2. new onset- Hypertension associated with diabetes ([place])   3. NEW ONSET-  Mixed diabetic hyperlipidemia associated with type 2 diabetes mellitus ([place])   4. Vitamin D deficiency   5. High risk medications (not anticoagulants) long-term use      Type 2 DM - Diet-Controlled - 6.0 last check three days ago, down from 7.2 three months ago. - Per patient, could not tolerate metformin. - Has been diet-controlled.  - Patient will attend clinical diabetic nutrition counseling. - Per patient, did not participate in the past due to fears about COVID-19.  - A1c most recently is stable, at goal. - Pt will continue current lifestyle regimen.  - Counseled patient on pathophysiology of disease and discussed various treatment options, which always includes dietary and lifestyle modification as first line.    - Importance of low carb, heart-healthy diet discussed with patient in addition to regular aerobic exercise of 90mn 5d/week or more.   - Check FBS and 2 hours after the biggest meal of your day.  Keep log and bring in next OV for my review.     - Also told patient if you ever feel poorly, please check your blood pressure and blood sugar, as one or the other could be the cause of your symptoms.  - Pt reminded about need for yearly eye and foot  exams.  Told patient to make appt.for diabetic eye exam, CMAs here will do foot exams  - We will continue to monitor.   Hypertension associated with DM - Advised patient of goal BP of 130/80 or less, ideally 120/80 or less.  - Blood pressure currently is stable, at goal.  - If patient's BP continues running 130's/80's, patient should try a half-tablet of BP medication. - If BP at goal without meds, patient may continue to avoid taking medication and focus on lifestyle goals.  - Advised patient to continue with prudent lifestyle changes to maintain control of her blood sugar and blood pressure.  - Counseled patient on pathophysiology of disease and discussed various treatment options, which always includes dietary and lifestyle modification as first line.   - Lifestyle changes such as dash and heart healthy diets and engaging in a regular exercise program discussed extensively with patient.   - Ambulatory blood pressure monitoring encouraged at least 3 times weekly.  Keep log and bring in every office visit.  Reminded patient that if they ever feel poorly in any way, to check their blood pressure and pulse.  - We will continue to monitor. - Patient knows to call in with any questions or concerns at any time.   Mixed Diabetic Hyperlipidemia - Last FLP obtained three days ago:  Triglycerides = 50, down from 61 prior. HDL = 36, down from 39 prior. LDL = 43, down from 62 prior.  - Cholesterol levels are WNL, at goal. - Reduce dose to half-tablet nightly before bed.  - Dietary changes such as low saturated & trans fat diets for hyperlipidemia and low carb diets for hypertriglyceridemia discussed with patient.    - Encouraged patient to follow AHA guidelines for regular exercise and also engage in weight loss if BMI above 25.   - We will continue to monitor and re-check as discussed.   Vitamin D Deficiency - Per patient, taking 4500 IU's daily. - Vitamin D 85.3 last check. -  Advised patient of goal of 40-6 range.  - Reduce dose to 1000 IU's in the summer, and back up to 4500 in winter.  - Will continue to monitor.    - The patient agreed with the plan and demonstrated an understanding of the instructions.   No barriers to understanding were identified.      Return for f/up in four months re-check A1c, FLP, with OV 3-5 days later.     Orders Placed This Encounter  Procedures  . Ambulatory referral to diabetic education    Meds ordered this encounter  Medications  . losartan-hydrochlorothiazide (HYZAAR) 50-12.5 MG tablet    Sig: Take 0.5 tablets by mouth daily.    Dispense:  45 tablet    Refill:  0  . atorvastatin (LIPITOR) 20 MG tablet    Sig: Take 1 tablet (20 mg total) by mouth at bedtime.    Medications Discontinued During This Encounter  Medication Reason  . metFORMIN (GLUCOPHAGE XR) 500 MG 24 hr tablet   . atorvastatin (LIPITOR) 40 MG tablet   . losartan-hydrochlorothiazide (HYZAAR) 50-12.5 MG tablet      Time spent on visit was 22 minutes.   Note:  This note was prepared with assistance of Dragon voice recognition software. Occasional wrong-word or sound-a-like substitutions may have occurred due to the inherent limitations of voice recognition software.  The [place] was signed into law in 2016 which includes the topic of electronic health records.  This provides immediate access to information in MyChart.  This includes consultation notes, operative notes, office notes, lab results and pathology reports.  If you have any questions about what you read please let us know at your next visit or call us at the office.  We are right here with you.  This document serves as a record of services personally performed by Dominique Dance, DO. It was created on her behalf by [name], a trained medical scribe. The creation of this record is based on the scribe's personal observations and the provider's statements to them.     The above documentation from [name], medical scribe, has been reviewed by Marjory Sneddon, D.O.  _____________________________________________________________     Patient Care Team    Relationship Specialty Notifications Start End  Dominique Dance, DO PCP - General Family Medicine  01/15/18   Bobbitt, [place] Muta, MD Consulting Physician Allergy and Immunology  01/15/18   Maisie Fus, MD Consulting Physician Obstetrics and Gynecology  01/15/18   Delsa Sale, OD  Optometry  03/01/19    Comment: does yrly Diabetic Retinopathy exams     -Vitals obtained; medications/ allergies reconciled;  personal medical, social, Sx etc.histories were updated by CMA, reviewed by me and are reflected in chart   Patient Active Problem List   Diagnosis Date Noted  . Diabetes mellitus ([place]) 03/01/2019  . Mixed diabetic hyperlipidemia associated with type 2 diabetes mellitus ([place]) 03/01/2019  . Hypertension associated with diabetes ([place]) 01/15/2018  . Gastroesophageal reflux disease 01/15/2018  . Hypothyroidism 02/05/2016  . Moderate persistent asthma 02/17/2007  . Obstructive sleep apnea, hx of 02/17/2007  . Chronic sinusitis 09/19/2016  . Environmental and seasonal allergies 06/15/2016  . Anemia 06/15/2016  . Vitamin D deficiency 03/01/2019  . 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 Perennial allergic rhinitis 09/19/2016  . Rhinitis medicamentosa 09/19/2016  . 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 Eczema 06/15/2016  . EPISTAXIS, RECURRENT 03/24/2009  . BRONCHITIS, ACUTE 10/31/2008  . Sinusitis,  chronic 02/29/2008  . ABDOMINAL PAIN, RIGHT UPPER QUADRANT 06/29/2007  . Lipoma 06/12/2007  . SYNCOPE, VASOVAGAL 06/12/2007     Current Meds  Medication Sig  . atenolol (TENORMIN) 25 MG tablet Take 1 tablet (25 mg total) by mouth daily.  Marland Kitchen atorvastatin (LIPITOR) 20 MG tablet Take 1 tablet (20 mg total) by mouth at bedtime.  . Azelastine-Fluticasone 137-50  MCG/ACT SUSP 1 spray each nostril twice daily following sinus rinse  . cholecalciferol (VITAMIN D3) 25 MCG (1000 UT) tablet Take 1,000 Units by mouth daily.  Marland Kitchen levothyroxine (SYNTHROID, LEVOTHROID) 100 MCG tablet Patient states she is taking 1/2 tablet once daily  . losartan-hydrochlorothiazide (HYZAAR) 50-12.5 MG tablet Take 0.5 tablets by mouth daily.  . Multiple Vitamin (MULTIVITAMIN) tablet Take 1 tablet by mouth daily.  Marland Kitchen omeprazole (PRILOSEC) 20 MG capsule Take 1 capsule (20 mg total) by mouth daily.  . ondansetron (ZOFRAN) 4 MG tablet Take 1 tablet (4 mg total) by mouth every 8 (eight) hours as needed for nausea or vomiting.  . [DISCONTINUED] atorvastatin (LIPITOR) 40 MG tablet Take 1 tablet (40 mg total) by mouth at bedtime.  . [DISCONTINUED] losartan-hydrochlorothiazide (HYZAAR) 50-12.5 MG tablet Take 1 tablet by mouth daily.  . [DISCONTINUED] progesterone (PROMETRIUM) 100 MG capsule Take 100 mg by mouth daily.       Allergies:  Allergies  Allergen Reactions  . Azithromycin Rash    REACTION: rash  . Codeine Nausea And Vomiting  . Penicillins Rash  . Metronidazole Diarrhea  . Shellfish Allergy Rash     ROS:  See above HPI for pertinent positives and negatives   Objective:   Blood pressure 132/78, height _0  (1.651 m), weight 140 lb (63.5 kg).  (if some vitals are omitted, this means that patient was UNABLE to obtain them even though they were asked to get them prior to OV today.  They were asked to call us at their earliest convenience with these once obtained. ) General: A & O * 3; sounds in no acute distress; in usual state of health.  Skin: Pt confirms warm and dry extremities and pink fingertips HEENT: Pt confirms lips non-cyanotic Chest: Patient confirms normal chest excursion and movement Respiratory: speaking in full sentences, no conversational dyspnea; patient confirms no use of accessory muscles Psych: insight appears good, mood- appears full

## 2019-07-04 ENCOUNTER — Encounter: Payer: Self-pay | Admitting: Dietician

## 2019-07-04 ENCOUNTER — Other Ambulatory Visit: Payer: Self-pay

## 2019-07-04 ENCOUNTER — Encounter: Payer: 59 | Attending: Family Medicine | Admitting: Dietician

## 2019-07-04 DIAGNOSIS — E1169 Type 2 diabetes mellitus with other specified complication: Secondary | ICD-10-CM | POA: Insufficient documentation

## 2019-07-04 NOTE — Patient Instructions (Addendum)
Plan:  Aim for 2-3 Carb Choices per meal (30-45 grams) +/- 1 either way  Aim for 0-1 Carbs per snack if hungry  Include protein in moderation with your meals and snacks Consider reading food labels for Total Carbohydrate of foods Consider  increasing your activity level by walking for 30 minutes daily as tolerated Consider checking BG at alternate times per day   Mindfulness:  Are you hungry for the snack or eating out of boredom. Consider increased variety, including protein with meals, foods you can chew rather than drinks to be more satisfied with meals.

## 2019-07-04 NOTE — Progress Notes (Signed)
Diabetes Self-Management Education  Visit Type: First/Initial  Appt. Start Time: 0910 Appt. End Time: 1020  07/04/2019  [name], identified by name and date of birth, is a 64 y.o. female with a diagnosis of Diabetes: Type 2.   ASSESSMENT Patient is here today alone.  She would like to learn more about nutrition and specifically how to get her blood sugar down in the am.  History includes newly diagnosed diabetes, prediabetes for the last 8 years with very strong family history of diabetes, HLD, HTN, vitamin D deficiency. Did not tolerate Metformin. A1C dropped from 7.3% 02/2019 to 6% 05/27/19.  Weight hx: 100 lbs until 64 yo 130 lbs 2 years ago 152 lbs highest weight during covid 142 lbs today  Patient lives with her husband.  She has everything delivered to her home.  She does the cooking. Patient states that she stress eats. 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  She has been working increased hours.  Increased stress. 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  Gained to 152 lbs during [place]. Height 5\' 5"  (1.651 m), weight 142 lb (64.4 kg). Body mass index is 23.63 kg/m.  Diabetes Self-Management Education - 07/04/19 0924      Visit Information   Visit Type  First/Initial      Initial Visit   Diabetes Type  Type 2    Are you currently following a meal plan?  Yes    What type of meal plan do you follow?  low carb    Are you taking your medications as prescribed?  Not on Medications    Date Diagnosed  2021      Health Coping   How would you rate your overall health?  Good      Psychosocial Assessment   Patient Belief/Attitude about Diabetes  Motivated to manage diabetes    Self-care barriers  None    Self-management support  Doctor's office    Other persons present  Patient    Patient Concerns  Nutrition/Meal planning    Special Needs  None    Preferred Learning  Style  No preference indicated    Learning Readiness  Ready    How often do you need to have someone help you when you read instructions, pamphlets, or other written materials from your doctor or pharmacy?  1 - Never    What is the last grade level you completed in school?  12      Pre-Education Assessment   Patient understands the diabetes disease and treatment process.  Needs Review    Patient understands incorporating nutritional management into lifestyle.  Needs Review    Patient undertands incorporating physical activity into lifestyle.  Needs Review    Patient understands using medications safely.  Needs Review    Patient understands monitoring blood glucose, interpreting and using results  Needs Review    Patient understands prevention, detection, and treatment of acute complications.  Needs Review    Patient understands prevention, detection, and treatment of chronic complications.  Needs Review    Patient understands how to develop strategies to address psychosocial issues.  Needs Review    Patient understands how to develop strategies to promote health/change behavior.  Needs Review      Complications   Last HgB A1C per patient/outside source  6 %   05/27/19 decreased from 7.3% 02/2019   How often do you check your blood sugar?  1-2  times/day    Fasting Blood glucose range (mg/dL)  70-129    Postprandial Blood glucose range (mg/dL)  70-129    Number of hypoglycemic episodes per month  0    Number of hyperglycemic episodes per week  0    Have you had a dilated eye exam in the past 12 months?  Yes    Have you had a dental exam in the past 12 months?  Yes    Are you checking your feet?  Yes    How many days per week are you checking your feet?  1      Dietary Intake   Breakfast  Daily Harvest drink (cherries, kale, Acia, berries, water,)   6 am   Snack (morning)  Van's Blueberry waffle with cream cheese   9   Lunch  protein powder (mixed with 4 strawberries and almond milk)     Snack (afternoon)  Adkin's bar and/or popcorn    Dinner  salad, dried cherries, pecans, salmon   6-6:30   Snack (evening)  popcorn    Beverage(s)  water, coffee with coffee      Exercise   Exercise Type  Light (walking / raking leaves)    How many days per week to you exercise?  3    How many minutes per day do you exercise?  30    Total minutes per week of exercise  90      Patient Education   Previous Diabetes Education  Yes (please comment)   2013 for prediabetes   Disease state   Definition of diabetes, type 1 and 2, and the diagnosis of diabetes    Nutrition management   Role of diet in the treatment of diabetes and the relationship between the three main macronutrients and blood glucose level;Food label reading, portion sizes and measuring food.;Meal options for control of blood glucose level and chronic complications.;Effects of alcohol on blood glucose and safety factors with consumption of alcohol.    Physical activity and exercise   Role of exercise on diabetes management, blood pressure control and cardiac health.    Monitoring  Purpose and frequency of SMBG.;Identified appropriate SMBG and/or A1C goals.    Acute complications  Taught treatment of hypoglycemia - the 15 rule.;Discussed and identified patients' treatment of hyperglycemia.    Chronic complications  Relationship between chronic complications and blood glucose control;Assessed and discussed foot care and prevention of foot problems    Psychosocial adjustment  Role of stress on diabetes;Identified and addressed patients feelings and concerns about diabetes      Individualized Goals (developed by patient)   Nutrition  General guidelines for healthy choices and portions discussed    Physical Activity  Exercise 5-7 days per week;30 minutes per day    Medications  Not Applicable    Monitoring   test my blood glucose as discussed    Reducing Risk  examine blood glucose patterns;increase portions of healthy fats    Health  Coping  discuss diabetes with (comment)   MD, RD, CDE     Post-Education Assessment   Patient understands the diabetes disease and treatment process.  Demonstrates understanding / competency    Patient understands incorporating nutritional management into lifestyle.  Demonstrates understanding / competency    Patient undertands incorporating physical activity into lifestyle.  Demonstrates understanding / competency    Patient understands using medications safely.  Demonstrates understanding / competency    Patient understands monitoring blood glucose, interpreting and using results  Demonstrates understanding /  competency    Patient understands prevention, detection, and treatment of acute complications.  Demonstrates understanding / competency    Patient understands prevention, detection, and treatment of chronic complications.  Demonstrates understanding / competency    Patient understands how to develop strategies to address psychosocial issues.  Demonstrates understanding / competency    Patient understands how to develop strategies to promote health/change behavior.  Demonstrates understanding / competency      Outcomes   Expected Outcomes  Demonstrated interest in learning. Expect positive outcomes    Future DMSE  PRN    Program Status  Completed       Individualized Plan for Diabetes Self-Management Training:   Learning Objective:  Patient will have a greater understanding of diabetes self-management. Patient education plan is to attend individual and/or group sessions per assessed needs and concerns.   Plan:   Patient Instructions  Plan:  Aim for 2-3 Carb Choices per meal (30-45 grams) +/- 1 either way  Aim for 0-1 Carbs per snack if hungry  Include protein in moderation with your meals and snacks Consider reading food labels for Total Carbohydrate of foods Consider  increasing your activity level by walking for 30 minutes daily as tolerated Consider checking BG at  alternate times per day   Mindfulness:  Are you hungry for the snack or eating out of boredom. Consider increased variety, including protein with meals, foods you can chew rather than drinks to be more satisfied with meals.     Expected Outcomes:  Demonstrated interest in learning. Expect positive outcomes  Education material provided: ADA - How to Thrive: A Guide for Your Journey with Diabetes, Meal plan card and Snack sheet  If problems or questions, patient to contact team via:  Phone  Future DSME appointment: PRN

## 2019-07-17 ENCOUNTER — Other Ambulatory Visit: Payer: Self-pay | Admitting: Family Medicine

## 2019-07-18 ENCOUNTER — Other Ambulatory Visit: Payer: Self-pay | Admitting: Physician Assistant

## 2019-07-18 MED ORDER — ATENOLOL 25 MG PO TABS: 25.0000 mg | ORAL_TABLET | Freq: Every day | ORAL | 0 refills | Status: DC

## 2019-07-18 NOTE — Telephone Encounter (Signed)
Patient called states pharmacy sent refill request to [place] & got no response(advised req went to Dr. Raliegh Scarlet since she was the last one who prescribed Rx & since Dr. Jenetta Downer no longer @ [facility] it was denied/rejected)--Advised would send manual request to med staff.  --Forwarding request to med asst for refill on :   1)--- atenolol (TENORMIN) 25 MG tablet [id number]   Order Details Dose: 25 mg Route: Oral Frequency: Daily  Dispense Quantity: 90 tablet Refills: 1       Sig: Take 1 tablet (25 mg total) by mouth daily.       --Pt uses :   Product/process development scientist [address]), [place] [address] [id number] (Phone) [phone] (Fax)   --glh

## 2019-08-10 ENCOUNTER — Other Ambulatory Visit: Payer: Self-pay

## 2019-08-10 ENCOUNTER — Emergency Department (HOSPITAL_COMMUNITY)
Admission: EM | Admit: 2019-08-10 | Discharge: 2019-08-10 | Disposition: A | Discharge status: Home / Self Care | Payer: PRIVATE HEALTH INSURANCE | Attending: Emergency Medicine | Admitting: Emergency Medicine

## 2019-08-10 ENCOUNTER — Encounter (HOSPITAL_COMMUNITY): Payer: Self-pay | Admitting: Emergency Medicine

## 2019-08-10 DIAGNOSIS — Z79899 Other long term (current) drug therapy: Secondary | ICD-10-CM | POA: Diagnosis not present

## 2019-08-10 DIAGNOSIS — E119 Type 2 diabetes mellitus without complications: Secondary | ICD-10-CM | POA: Diagnosis not present

## 2019-08-10 DIAGNOSIS — R102 Pelvic and perineal pain: Secondary | ICD-10-CM | POA: Insufficient documentation

## 2019-08-10 DIAGNOSIS — R300 Dysuria: Secondary | ICD-10-CM | POA: Diagnosis not present

## 2019-08-10 DIAGNOSIS — Z7984 Long term (current) use of oral hypoglycemic drugs: Secondary | ICD-10-CM | POA: Diagnosis not present

## 2019-08-10 DIAGNOSIS — E039 Hypothyroidism, unspecified: Secondary | ICD-10-CM | POA: Insufficient documentation

## 2019-08-10 DIAGNOSIS — I10 Essential (primary) hypertension: Secondary | ICD-10-CM | POA: Diagnosis not present

## 2019-08-10 DIAGNOSIS — J45909 Unspecified asthma, uncomplicated: Secondary | ICD-10-CM | POA: Insufficient documentation

## 2019-08-10 NOTE — ED Provider Notes (Signed)
[facility] EMERGENCY DEPARTMENT Provider Note   CSN: [id number] Arrival date & time: 08/10/19  1102     History Chief Complaint  Patient presents with  . Vaginal Pain    Dominique Brock is a 64 y.o. female.  Patient is a 65 year old female with a history of diabetes, hypertension, hypothyroidism, vaginal atrophy who is presenting today with vaginal pain and dysuria.  Patient reports Dominique Brock saw her OB/GYN last week and had a normal pelvic exam.  However Dominique Brock was having dyspareunia and started using vaginal estrogen inserts.  Dominique Brock states Dominique Brock has used them in the past and was not having any issues using them now.  However today when Dominique Brock attempted to insert the estrogen Dominique Brock was having difficulty inserting it and there was a significant amount of pain.  Dominique Brock then waited and tried again and had significant urethral pain and reported Dominique Brock urinated everywhere.  After the event Dominique Brock was having dysuria and had some mild blood in her urine.  That was directly after the event but now Dominique Brock reports Dominique Brock is starting to feel better.  However Dominique Brock called her OB/GYN and they recommended Dominique Brock come here for evaluation.  The history is provided by the patient.  Vaginal Pain This is a new problem. The current episode started 3 to 5 hours ago. The problem occurs constantly. The problem has been gradually improving. Exacerbated by: Urinating.       Past Medical History:  Diagnosis Date  . Asthma   . Diabetes mellitus    prediabetic diet controlled  . Eczema 06/15/2016  . Hypertension   . Hypothyroidism   . Sleep apnea    only with allergies  . Thyroid disease     Patient Active Problem List   Diagnosis Date Noted  . Diabetes mellitus ([place]) 03/01/2019  . Mixed diabetic hyperlipidemia associated with type 2 diabetes mellitus ([place]) 03/01/2019  . Vitamin D deficiency 03/01/2019  . 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 Hypertension associated with diabetes ([place]) 01/15/2018  . Gastroesophageal  reflux disease 01/15/2018  . Perennial allergic rhinitis 09/19/2016  . Rhinitis medicamentosa 09/19/2016  . 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 Chronic sinusitis 09/19/2016  . Environmental and seasonal allergies 06/15/2016  . Anemia 06/15/2016  . Eczema 06/15/2016  . Hypothyroidism 02/05/2016  . EPISTAXIS, RECURRENT 03/24/2009  . BRONCHITIS, ACUTE 10/31/2008  . Sinusitis, chronic 02/29/2008  . ABDOMINAL PAIN, RIGHT UPPER QUADRANT 06/29/2007  . Lipoma 06/12/2007  . SYNCOPE, VASOVAGAL 06/12/2007  . Moderate persistent asthma 02/17/2007  . Obstructive sleep apnea, hx of 02/17/2007    Past Surgical History:  Procedure Laterality Date  . ABDOMINAL HYSTERECTOMY    . BILATERAL OOPHORECTOMY    . EXCISION MASS UPPER EXTREMETIES Left 04/18/2019   Procedure: Excision of left arm mass / lipoma;  Surgeon: Wallace Going, DO;  Location: [facility];  Service: Plastics;  Laterality: Left;  . SINUS EXPLORATION  1996     OB History   No obstetric history on file.     Family History  Problem Relation Age of Onset  . Hypertension Mother   . Hyperlipidemia Mother   . Liver cancer Mother 69       died in 2011/05/19, at age 36 or 41.  . Allergies Mother   . Diabetes Mother 59       onset "mid fifties."  . Healthy Father   . Dementia Father 51       not officialy diagnosed  .  Healthy Other        2 siblings  . Asthma Maternal Grandmother   . Diabetes Maternal Grandmother   . Asthma Cousin   . Allergic rhinitis Neg Hx   . Angioedema Neg Hx   . Eczema Neg Hx   . Immunodeficiency Neg Hx   . Urticaria Neg Hx     Social History   Tobacco Use  . Smoking status: Never Smoker  . Smokeless tobacco: Never Used  Vaping Use  . Vaping Use: Never used  Substance Use Topics  . Alcohol use: Yes    Comment: less than once a month  . Drug use: No    Home Medications Prior to Admission medications   Medication Sig Start Date End Date Taking? Authorizing  Provider  atenolol (TENORMIN) 25 MG tablet Take 1 tablet (25 mg total) by mouth daily. 12/26/18   Opalski, Deborah, DO  atenolol (TENORMIN) 25 MG tablet Take 1 tablet (25 mg total) by mouth daily. 07/18/19   Lorrene Reid, PA-C  atorvastatin (LIPITOR) 20 MG tablet Take 1 tablet (20 mg total) by mouth at bedtime. Patient taking differently: Take 10 mg by mouth at bedtime.  05/30/19   Mellody Dance, DO  Azelastine-Fluticasone 137-50 MCG/ACT SUSP 1 spray each nostril twice daily following sinus rinse 07/04/18   Opalski, Deborah, DO  cholecalciferol (VITAMIN D3) 25 MCG (1000 UT) tablet Take 1,000 Units by mouth daily.    [provider]  levothyroxine (SYNTHROID, LEVOTHROID) 100 MCG tablet Patient states Dominique Brock is taking 1/2 tablet once daily 10/19/17   [provider]  losartan-hydrochlorothiazide (HYZAAR) 50-12.5 MG tablet Take 0.5 tablets by mouth daily. Patient not taking: Reported on 07/04/2019 05/30/19   Mellody Dance, DO  Multiple Vitamin (MULTIVITAMIN) tablet Take 1 tablet by mouth daily.    [provider]  omeprazole (PRILOSEC) 20 MG capsule Take 1 capsule (20 mg total) by mouth daily. 12/26/18   Opalski, Deborah, DO  ondansetron (ZOFRAN) 4 MG tablet Take 1 tablet (4 mg total) by mouth every 8 (eight) hours as needed for nausea or vomiting. Patient not taking: Reported on 07/04/2019 04/09/19   Threasa Heads, PA-C  progesterone 200 MG SUPP Place 100 mg vaginally at bedtime.    [provider]  imipramine (TOFRANIL) 25 MG tablet Take 1 tablet (25 mg total) by mouth at bedtime. 12/28/10 05/04/11  Baird Lyons D, MD  progesterone (PROMETRIUM) 100 MG capsule Take 100 mg by mouth daily.    05/30/19  [provider]    Allergies    Azithromycin, Codeine, Penicillins, Metronidazole, and Shellfish allergy  Review of Systems   Review of Systems  Genitourinary: Positive for vaginal pain.  All other systems reviewed and are negative.   Physical  Exam Updated Vital Signs BP (!) 150/77   Pulse (!) 50   Temp 97.9 F (36.6 C) (Oral)   Resp 18   Ht 5\' 5"  (1.651 m)   Wt 63.5 kg   SpO2 100%   BMI 23.30 kg/m   Physical Exam Vitals and nursing note reviewed.  Constitutional:      General: Dominique Brock is not in acute distress.    Appearance: Normal appearance. Dominique Brock is well-developed and normal weight.  HENT:     Head: Normocephalic and atraumatic.  Eyes:     Pupils: Pupils are equal, round, and reactive to light.  Cardiovascular:     Rate and Rhythm: Normal rate and regular rhythm.     Heart sounds: Normal heart sounds. No  murmur heard.  No friction rub.  Pulmonary:     Effort: Pulmonary effort is normal.     Breath sounds: Normal breath sounds. No wheezing or rales.  Abdominal:     General: Bowel sounds are normal. There is no distension.     Palpations: Abdomen is soft.     Tenderness: There is no abdominal tenderness. There is no right CVA tenderness, left CVA tenderness, guarding or rebound.  Genitourinary:    General: Normal vulva.     Urethra: No prolapse, urethral pain, urethral swelling or urethral lesion.     Vagina: Normal.     Cervix: Normal.     Comments: Minimal tenderness bilaterally with adnexal palpation.  No masses noted Musculoskeletal:        General: No tenderness. Normal range of motion.     Right lower leg: No edema.     Left lower leg: No edema.     Comments: No edema  Skin:    General: Skin is warm and dry.     Findings: No rash.  Neurological:     Mental Status: Dominique Brock is alert and oriented to person, place, and time.     Cranial Nerves: No cranial nerve deficit.  Psychiatric:        Behavior: Behavior normal.     ED Results / Procedures / Treatments   Labs (all labs ordered are listed, but only abnormal results are displayed) Labs Reviewed - No data to display  EKG None  Radiology No results found.  Procedures Procedures (including critical care time)  Medications Ordered in  ED Medications - No data to display  ED Course  I have reviewed the triage vital signs and the nursing notes.  Pertinent labs & imaging results that were available during my care of the patient were reviewed by me and considered in my medical decision making (see chart for details).    MDM Rules/Calculators/A&P                          Patient presenting after experiencing dysuria, minimal hematuria after attempting to insert a vaginal estrogen and meeting resistance and significant pain.  Patient urinated just prior to pelvic exam and reports now has no further pain or hematuria.  On exam there is no evidence of bladder or uterine prolapse.  No evidence of bleeding or acute abnormality.  At this time encourage patient to hold the estrogen in chart for 1 day or 2 until symptoms resolve and then Dominique Brock can try again or discuss with her OB/GYN.  Final Clinical Impression(s) / ED Diagnoses Final diagnoses:  Vaginal pain    Rx / DC Orders ED Discharge Orders    None       Blanchie Dessert, MD 08/10/19 1534

## 2019-08-10 NOTE — Discharge Instructions (Signed)
Hold using any of the gel for the next 1 to 2 days and then if everything is feeling normal you can try again but if symptoms occur again discontinue.

## 2019-08-10 NOTE — ED Triage Notes (Signed)
Pt states she was inserting vaginal Estrogen and felt like her bladder popped.  States there was a small amount of blood.  Reports vaginal burning.

## 2019-08-10 NOTE — ED Notes (Signed)
Got patient on the monitor patient is resting with call bell in reach  ?

## 2019-08-10 NOTE — ED Notes (Signed)
Patient verbalizes understanding of discharge instructions. Opportunity for questioning and answers were provided. Armband removed by staff, pt discharged from ED.  

## 2019-09-26 ENCOUNTER — Other Ambulatory Visit: Payer: Self-pay | Admitting: Family Medicine

## 2019-09-26 ENCOUNTER — Telehealth: Payer: Self-pay | Admitting: Physician Assistant

## 2019-09-26 DIAGNOSIS — J3089 Other allergic rhinitis: Secondary | ICD-10-CM

## 2019-09-26 MED ORDER — AZELASTINE-FLUTICASONE 137-50 MCG/ACT NA SUSP: NASAL | 0 refills | Status: DC

## 2019-09-26 NOTE — Addendum Note (Signed)
Addended by: Mickel Crow on: 09/26/2019 01:47 PM   Modules accepted: Orders

## 2019-09-26 NOTE — Telephone Encounter (Signed)
Patient is requesting a refill of her nasal spray, if approved please send to [facility] on [address].

## 2019-09-26 NOTE — Telephone Encounter (Signed)
Patient needs to schedule an apt for further refills.    Refill sent to requested pharmacy. AS, CMA

## 2019-10-16 ENCOUNTER — Other Ambulatory Visit: Payer: 59

## 2019-10-16 ENCOUNTER — Other Ambulatory Visit: Payer: Self-pay

## 2019-10-16 DIAGNOSIS — Z20822 Contact with and (suspected) exposure to covid-19: Secondary | ICD-10-CM

## 2019-10-17 LAB — NOVEL CORONAVIRUS, NAA: SARS-CoV-2, NAA: NOT DETECTED

## 2019-11-06 ENCOUNTER — Telehealth: Payer: Self-pay | Admitting: Physician Assistant

## 2019-11-06 MED ORDER — ATENOLOL 25 MG PO TABS: 25.0000 mg | ORAL_TABLET | Freq: Every day | ORAL | 0 refills | Status: DC

## 2019-11-06 NOTE — Telephone Encounter (Signed)
Patient called in stating [facility] needs to get in touch with [place] about her atenolol. It is [facility] [address]. I think she needs a refill.

## 2019-11-06 NOTE — Addendum Note (Signed)
Addended by: Mickel Crow on: 11/06/2019 03:17 PM   Modules accepted: Orders

## 2019-11-06 NOTE — Telephone Encounter (Signed)
Refill sent to patient pharmacy.   Please call patient to schedule apt for further medication refills. AS, CMA

## 2019-12-11 ENCOUNTER — Other Ambulatory Visit: Payer: Self-pay

## 2019-12-11 ENCOUNTER — Encounter: Payer: Self-pay | Admitting: Physician Assistant

## 2019-12-11 ENCOUNTER — Ambulatory Visit: Payer: 59 | Admitting: Physician Assistant

## 2019-12-11 ENCOUNTER — Other Ambulatory Visit: Payer: 59

## 2019-12-11 VITALS — BP 126/76 | HR 63 | Temp 98.0°F | Ht 65.0 in | Wt 144.2 lb

## 2019-12-11 DIAGNOSIS — G8321 Monoplegia of upper limb affecting right dominant side: Secondary | ICD-10-CM

## 2019-12-11 DIAGNOSIS — M79641 Pain in right hand: Secondary | ICD-10-CM | POA: Diagnosis not present

## 2019-12-11 DIAGNOSIS — E1169 Type 2 diabetes mellitus with other specified complication: Secondary | ICD-10-CM

## 2019-12-11 DIAGNOSIS — E119 Type 2 diabetes mellitus without complications: Secondary | ICD-10-CM

## 2019-12-11 DIAGNOSIS — E782 Mixed hyperlipidemia: Secondary | ICD-10-CM

## 2019-12-11 NOTE — Progress Notes (Signed)
Acute Office Visit  Subjective:    Patient ID: Dominique Brock, female    DOB: 04-13-1955, 64 y.o.   MRN: [id number]  Chief Complaint  Patient presents with   right hand curling and intermittent paralysis    HPI Patient is in today for concerns of episodic intermittent right hand paralysis with hand curling.  Reports has experienced 2 episodes this year which usually happen all of a sudden and last less than 5 minutes.  States during episodes is unable to move her hand but upper arm is mobile.  Denies new medications, joint stiffness, swelling, redness, numbness, tingling, pins or needles sensation.  Reports fatigue, pain around the base of both hands R>L, decreased strength and reports left thumb sometimes locks. Denies family history of neurological disease or autoimmune conditions.  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  Has been extremely stressed for the last 2 years. States thyroid is managed by [facility] and recently had a TSH checked which was normal.  Past Medical History:  Diagnosis Date   Asthma    Diabetes mellitus    prediabetic diet controlled   Eczema 06/15/2016   Hypertension    Hypothyroidism    Sleep apnea    only with allergies   Thyroid disease     Past Surgical History:  Procedure Laterality Date   ABDOMINAL HYSTERECTOMY     BILATERAL OOPHORECTOMY     EXCISION MASS UPPER EXTREMETIES Left 04/18/2019   Procedure: Excision of left arm mass / lipoma;  Surgeon: Wallace Going, DO;  Location: [place];  Service: Plastics;  Laterality: Left;   SINUS EXPLORATION  1996    Family History  Problem Relation Age of Onset   Hypertension Mother    Hyperlipidemia Mother    Liver cancer Mother 24       died in April 11, 2011, at age 27 or 61.   Allergies Mother    Diabetes Mother 46       onset "mid 71."   Healthy Father    Dementia Father 46       not officialy diagnosed   Healthy Other        2 siblings   Asthma  Maternal Grandmother    Diabetes Maternal Grandmother    Asthma Cousin    Allergic rhinitis Neg Hx    Angioedema Neg Hx    Eczema Neg Hx    Immunodeficiency Neg Hx    Urticaria Neg Hx     Social History   Socioeconomic History   Marital status: Married    Spouse name: Not on file   Number of children: 2   Years of education: Not on file   Highest education level: Not on file  Occupational History   Not on file  Tobacco Use   Smoking status: Never Smoker   Smokeless tobacco: Never Used  Vaping Use   Vaping Use: Never used  Substance and Sexual Activity   Alcohol use: Yes    Comment: less than once a month   Drug use: No   Sexual activity: Yes  Other Topics Concern   Not on file  Social History Narrative   Not on file   Social Determinants of Health   Financial Resource Strain:    Difficulty of Paying Living Expenses: Not on file  Food Insecurity:    Worried About Running Out of Food in the Last Year: Not on file   [employer] of Food in the Last  Year: Not on file  Transportation Needs:    Lack of Transportation (Medical): Not on file   Lack of Transportation (Non-Medical): Not on file  Physical Activity:    Days of Exercise per Week: Not on file   Minutes of Exercise per Session: Not on file  Stress:    Feeling of Stress : Not on file  Social Connections:    Frequency of Communication with Friends and Family: Not on file   Frequency of Social Gatherings with Friends and Family: Not on file   Attends Religious Services: Not on file   Active Member of Clubs or Organizations: Not on file   Attends Archivist Meetings: Not on file   Marital Status: Not on file  Intimate Partner Violence:    Fear of Current or Ex-Partner: Not on file   Emotionally Abused: Not on file   Physically Abused: Not on file   Sexually Abused: Not on file    Outpatient Medications Prior to Visit  Medication Sig Dispense Refill   atenolol  (TENORMIN) 25 MG tablet Take 1 tablet (25 mg total) by mouth daily. 90 tablet 0   atenolol (TENORMIN) 25 MG tablet Take 1 tablet (25 mg total) by mouth daily. **NEEDS APT FOR FURTHER REFILLS** 60 tablet 0   Azelastine-Fluticasone 137-50 MCG/ACT SUSP 1 spray each nostril twice daily following sinus rinse 23 g 0   cholecalciferol (VITAMIN D3) 25 MCG (1000 UT) tablet Take 1,000 Units by mouth daily.     levothyroxine (SYNTHROID, LEVOTHROID) 100 MCG tablet Patient states she is taking 1/2 tablet once daily  2   Multiple Vitamin (MULTIVITAMIN) tablet Take 1 tablet by mouth daily.     omeprazole (PRILOSEC) 20 MG capsule Take 1 capsule (20 mg total) by mouth daily. 90 capsule 3   ondansetron (ZOFRAN) 4 MG tablet Take 1 tablet (4 mg total) by mouth every 8 (eight) hours as needed for nausea or vomiting. (Patient not taking: Reported on 07/04/2019) 20 tablet 0   progesterone 200 MG SUPP Place 100 mg vaginally at bedtime.     spironolactone (ALDACTONE) 25 MG tablet Take 25 mg by mouth daily.     atorvastatin (LIPITOR) 20 MG tablet Take 1 tablet (20 mg total) by mouth at bedtime. (Patient taking differently: Take 10 mg by mouth at bedtime. )     losartan-hydrochlorothiazide (HYZAAR) 50-12.5 MG tablet Take 0.5 tablets by mouth daily. (Patient not taking: Reported on 07/04/2019) 45 tablet 0   No facility-administered medications prior to visit.    Allergies  Allergen Reactions   Azithromycin Rash    REACTION: rash   Codeine Nausea And Vomiting   Penicillins Rash   Metronidazole Diarrhea   Shellfish Allergy Rash    Review of Systems  A fourteen system review of systems was performed and found to be positive as per HPI.    Objective:    Physical Exam General:  Well Developed, well nourished, in no acute disress Neuro:  Alert and oriented,  extra-ocular muscles intact, no focal deficits HEENT:  Normocephalic, atraumatic, neck supple  Skin:  no gross rash, warm, pink. Cardiac:  RRR,  S1 S2 Respiratory:  ECTA B/L, Not using accessory muscles, speaking in full sentences- unlabored. MSK: No significant bony abnormalities, good radial pulses, good range of motion of upper extremities, good grip strength, tenderness to palpation of thenar eminence R>L. Vascular:  Ext warm, no cyanosis apprec.; cap RF less 2 sec. Psych:  No HI/SI, judgement and insight good, Euthymic mood.  Full Affect.  BP 126/76    Pulse 63    Temp 98 F (36.7 C) (Oral)    Ht _0  (1.651 m)    Wt 144 lb 3.2 oz (65.4 kg)    SpO2 100% Comment: on RA   BMI 24.00 kg/m  Wt Readings from Last 3 Encounters:  12/11/19 144 lb 3.2 oz (65.4 kg)  08/10/19 140 lb (63.5 kg)  07/04/19 142 lb (64.4 kg)    Health Maintenance Due  Topic Date Due   Hepatitis C Screening  Never done   PNEUMOCOCCAL POLYSACCHARIDE VACCINE AGE 99-64 HIGH RISK  Never done   FOOT EXAM  Never done   COVID-19 Vaccine (1) Never done   HIV Screening  Never done   COLONOSCOPY  Never done   MAMMOGRAM  10/30/2008   INFLUENZA VACCINE  09/15/2019   HEMOGLOBIN A1C  11/26/2019    There are no preventive care reminders to display for this patient.   Lab Results  Component Value Date   TSH 2.04 03/02/2018   Lab Results  Component Value Date   WBC 7.7 05/27/2019   HGB 14.4 05/27/2019   HCT 43.4 05/27/2019   MCV 97 05/27/2019   PLT 216 05/27/2019   Lab Results  Component Value Date   NA 142 05/27/2019   K 4.1 05/27/2019   CO2 23 05/27/2019   GLUCOSE 96 05/27/2019   BUN 18 05/27/2019   CREATININE 0.93 05/27/2019   BILITOT 0.3 05/27/2019   ALKPHOS 66 05/27/2019   AST 24 05/27/2019   ALT 34 (H) 05/27/2019   PROT 6.0 05/27/2019   ALBUMIN 3.9 05/27/2019   CALCIUM 8.9 05/27/2019   ANIONGAP 9 04/16/2019   Lab Results  Component Value Date   CHOL 91 (L) 05/27/2019   Lab Results  Component Value Date   HDL 36 (L) 05/27/2019   Lab Results  Component Value Date   LDLCALC 43 05/27/2019   Lab Results  Component Value Date    TRIG 50 05/27/2019   Lab Results  Component Value Date   CHOLHDL 2.5 05/27/2019   Lab Results  Component Value Date   HGBA1C 6.0 (H) 05/27/2019       Assessment & Plan:   Problem List Items Addressed This Visit    None    Visit Diagnoses    Right hand pain    -  Primary   Relevant Orders   Ambulatory referral to Orthopedic Surgery   Paralysis of right hand ([place])- intermittent, brief       Relevant Orders   Ambulatory referral to Orthopedic Surgery   Antinuclear Antib (ANA)   Rheumatoid Factor   Comp Met (CMET)     Right hand pain, Paralysis of right hand- intermittent and brief: -Etiology is unclear.  Patient came in this morning for fasting blood work and will add additional labs to evaluate for possible etiologies such as electrolyte imbalances especially potassium, autoimmune disorder, or RA.  Patient denies paresthesias but will likely benefit from a nerve conduction study.  Hand pain most likely musculoskeletal related due to occupation/overuse.  Discussed with patient referrals to neurology and orthopedics and would like to proceed with orthopedic referral and prefers to wait on consultation before proceeding with EMG, which is reasonable.  No orders of the defined types were placed in this encounter.  Note:  This note was prepared with assistance of Dragon voice recognition software. Occasional wrong-word or sound-a-like substitutions may have occurred due to the inherent limitations of voice  recognition software.   Lorrene Reid, PA-C

## 2019-12-12 LAB — LIPID PANEL
Chol/HDL Ratio: 4.2 ratio (ref 0.0–4.4)
Cholesterol, Total: 189 mg/dL (ref 100–199)
HDL: 45 mg/dL (ref >39)
LDL Chol Calc (NIH): 129 mg/dL — ABNORMAL HIGH (ref 0–99)
Triglycerides: 83 mg/dL (ref 0–149)
VLDL Cholesterol Cal: 15 mg/dL (ref 5–40)

## 2019-12-12 LAB — COMPREHENSIVE METABOLIC PANEL
ALT: 25 IU/L (ref 0–32)
AST: 21 IU/L (ref 0–40)
Albumin/Globulin Ratio: 1.5 (ref 1.2–2.2)
Albumin: 4.4 g/dL (ref 3.8–4.8)
Alkaline Phosphatase: 70 IU/L (ref 44–121)
BUN/Creatinine Ratio: 20 (ref 12–28)
BUN: 21 mg/dL (ref 8–27)
Bilirubin Total: 0.5 mg/dL (ref 0.0–1.2)
CO2: 24 mmol/L (ref 20–29)
Calcium: 9.9 mg/dL (ref 8.7–10.3)
Chloride: 103 mmol/L (ref 96–106)
Creatinine, Ser: 1.06 mg/dL — ABNORMAL HIGH (ref 0.57–1.00)
GFR calc Af Amer: 65 mL/min/{1.73_m2} (ref >59)
GFR calc non Af Amer: 56 mL/min/{1.73_m2} — ABNORMAL LOW (ref >59)
Globulin, Total: 3 g/dL (ref 1.5–4.5)
Glucose: 97 mg/dL (ref 65–99)
Potassium: 4.6 mmol/L (ref 3.5–5.2)
Sodium: 141 mmol/L (ref 134–144)
Total Protein: 7.4 g/dL (ref 6.0–8.5)

## 2019-12-12 LAB — HEMOGLOBIN A1C
Est. average glucose Bld gHb Est-mCnc: 140 mg/dL
Hgb A1c MFr Bld: 6.5 % — ABNORMAL HIGH (ref 4.8–5.6)

## 2019-12-12 LAB — ANA: Anti Nuclear Antibody (ANA): NEGATIVE

## 2019-12-12 LAB — RHEUMATOID FACTOR: Rheumatoid fact SerPl-aCnc: <10 IU/mL (ref 0.0–13.9)

## 2019-12-18 ENCOUNTER — Telehealth: Payer: Self-pay | Admitting: Physician Assistant

## 2019-12-18 ENCOUNTER — Ambulatory Visit (INDEPENDENT_AMBULATORY_CARE_PROVIDER_SITE_OTHER): Payer: 59 | Admitting: Orthopaedic Surgery

## 2019-12-18 ENCOUNTER — Ambulatory Visit (INDEPENDENT_AMBULATORY_CARE_PROVIDER_SITE_OTHER): Payer: 59

## 2019-12-18 DIAGNOSIS — M79641 Pain in right hand: Secondary | ICD-10-CM

## 2019-12-18 DIAGNOSIS — G8321 Monoplegia of upper limb affecting right dominant side: Secondary | ICD-10-CM

## 2019-12-18 DIAGNOSIS — R202 Paresthesia of skin: Secondary | ICD-10-CM

## 2019-12-18 NOTE — Telephone Encounter (Signed)
Patient left voicemail stating how she was referred to a ortho doc and now she needs a referral to a neurologist. [phone]. Can you call her and advise please, thank you.

## 2019-12-18 NOTE — Telephone Encounter (Signed)
Per ortho office patient needs referral to Neurologist for hand paralysis. Ordered placed for referral. AS, CMA

## 2019-12-18 NOTE — Progress Notes (Signed)
Office Visit Note   Patient: Dominique Brock           Date of Birth: Dec 09, 1955           MRN: [id number] Visit Date: 12/18/2019              Requested by: Lorrene Reid, PA-C [place],  [place] [id number] PCP: Lorrene Reid, PA-C   Assessment & Plan: Visit Diagnoses:  1. Pain in right hand   2. 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  In regards to the [facility] arthritis, we have discussed cortisone injection, oral steroids or thumb spica splint.  The patient notes that her pain is not significant enough for cortisone injection and that oral steroids raise her blood sugar.  She already has a thumb spica splint which she will wear as needed.  In regards to the carpal tunnel syndrome, we will refer her to Dr. Ernestina Patches for nerve conduction study right upper extremity.  I told her that we will call her with the results.  In regards to the right hand paralysis for which she has had 2 episodes, I have discussed with her the necessity to further discuss this with her primary care doctor and for possible referral to neurology to rule out neurologic condition such as TIA or MS.  She will follow up with [place] as needed.  Follow-Up Instructions: Return if symptoms worsen or fail to improve.   Orders:  Orders Placed This Encounter  Procedures   XR Hand Complete Right   No orders of the defined types were placed in this encounter.     Procedures: No procedures performed   Clinical Data: No additional findings.   Subjective: Chief Complaint  Patient presents with   Right Hand - Pain    HPI patient is a very pleasant 64 year old right-hand-dominant female who comes in today with right hand pain and 2 episodes of paralysis.  The pain has been ongoing for the past 1 to 2 years.  She has had 2 episodes where her right hand  goes completely limp.  The only thing she can attribute to the paralysis type episodes is that she was very exhausted the day prior.  Otherwise, she does not note numbness, tingling or burning.  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  The pain that she gets is primarily to the thenar eminence and radiates into the thumb and first [place] joint.  She has increased pain opening jars as well as grasping things.  She takes an occasional aspirin when she wakes up with this pain.  She has a history of diabetes and hypertension.  She is not currently on any anticoagulants.  Review of Systems as detailed in HPI.  All others reviewed and are negative.   Objective: Vital Signs: There were no vitals taken for this visit.  Physical Exam well-developed well-nourished female no acute distress.  Alert and oriented x3.  Ortho Exam right hand exam shows very minimal tenderness to the first [facility] joint.  Very minimal positive grind test.  No tenderness to the first dorsal compartment.  Negative Finkelstein.  She has a positive Phalen on the right.  Negative Tinel.  She has full sensation distally.  Specialty Comments:  No specialty comments available.  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   Chronic sinusitis 09/19/2016   Environmental and seasonal allergies 06/15/2016   Anemia  06/15/2016   Eczema 06/15/2016   Hypothyroidism 02/05/2016   EPISTAXIS, RECURRENT 03/24/2009   BRONCHITIS, ACUTE 10/31/2008   Sinusitis, chronic 02/29/2008   ABDOMINAL PAIN, RIGHT UPPER QUADRANT 06/29/2007   Lipoma 06/12/2007   SYNCOPE, VASOVAGAL 06/12/2007   Moderate persistent asthma 02/17/2007   Obstructive sleep apnea, hx of 02/17/2007   Past Medical History:  Diagnosis Date   Asthma    Diabetes mellitus    prediabetic diet controlled   Eczema 06/15/2016   Hypertension    Hypothyroidism    Sleep apnea    only with allergies   Thyroid disease     Family History  Problem Relation Age of Onset   Hypertension Mother    Hyperlipidemia Mother    Liver cancer Mother 47       died in 05/29/2011, at age 27 or 45.   Allergies Mother    Diabetes Mother 58       onset "mid 81."   Healthy Father    Dementia Father 21       not officialy diagnosed   Healthy Other        2 siblings   Asthma Maternal Grandmother    Diabetes Maternal Grandmother    Asthma Cousin    Allergic rhinitis Neg Hx    Angioedema Neg Hx    Eczema Neg Hx    Immunodeficiency Neg Hx    Urticaria Neg Hx     Past Surgical History:  Procedure Laterality Date   ABDOMINAL HYSTERECTOMY     BILATERAL OOPHORECTOMY     EXCISION MASS UPPER EXTREMETIES Left 04/18/2019   Procedure: Excision of left arm mass / lipoma;  Surgeon: Wallace Going, DO;  Location: [place];  Service: Plastics;  Laterality: Left;   SINUS EXPLORATION  1996   Social History   Occupational History   Not on file  Tobacco Use   Smoking status: Never Smoker   Smokeless tobacco: Never Used  Vaping Use   Vaping Use: Never used  Substance and Sexual Activity   Alcohol use: Yes    Comment: less than once a month   Drug use: No   Sexual activity: Yes

## 2019-12-18 NOTE — Addendum Note (Signed)
Addended by: Mickel Crow on: 12/18/2019 11:03 AM   Modules accepted: Orders

## 2020-01-17 ENCOUNTER — Other Ambulatory Visit: Payer: Self-pay | Admitting: Physician Assistant

## 2020-02-04 ENCOUNTER — Encounter: Payer: 59 | Admitting: Physical Medicine and Rehabilitation

## 2020-02-20 ENCOUNTER — Other Ambulatory Visit: Payer: Self-pay | Admitting: Physician Assistant

## 2020-02-20 DIAGNOSIS — J3089 Other allergic rhinitis: Secondary | ICD-10-CM

## 2020-03-07 ENCOUNTER — Other Ambulatory Visit: Payer: Self-pay | Admitting: Physician Assistant

## 2020-03-12 ENCOUNTER — Ambulatory Visit: Payer: 59 | Admitting: Physician Assistant

## 2020-03-27 ENCOUNTER — Telehealth: Payer: Self-pay | Admitting: Physician Assistant

## 2020-03-27 DIAGNOSIS — K219 Gastro-esophageal reflux disease without esophagitis: Secondary | ICD-10-CM

## 2020-03-27 MED ORDER — OMEPRAZOLE 20 MG PO CPDR: 20.0000 mg | DELAYED_RELEASE_CAPSULE | Freq: Every day | ORAL | 0 refills | Status: DC

## 2020-03-27 NOTE — Telephone Encounter (Signed)
rx sent to pharmacy. AS, CMA 

## 2020-04-08 ENCOUNTER — Other Ambulatory Visit: Payer: Self-pay

## 2020-04-08 ENCOUNTER — Ambulatory Visit: Payer: 59 | Admitting: Physician Assistant

## 2020-04-08 ENCOUNTER — Encounter: Payer: Self-pay | Admitting: Physician Assistant

## 2020-04-08 VITALS — BP 121/70 | HR 79 | Temp 99.8°F | Ht 65.0 in | Wt 147.4 lb

## 2020-04-08 DIAGNOSIS — E782 Mixed hyperlipidemia: Secondary | ICD-10-CM

## 2020-04-08 DIAGNOSIS — E1159 Type 2 diabetes mellitus with other circulatory complications: Secondary | ICD-10-CM

## 2020-04-08 DIAGNOSIS — K219 Gastro-esophageal reflux disease without esophagitis: Secondary | ICD-10-CM | POA: Diagnosis not present

## 2020-04-08 DIAGNOSIS — E1169 Type 2 diabetes mellitus with other specified complication: Secondary | ICD-10-CM

## 2020-04-08 DIAGNOSIS — J3089 Other allergic rhinitis: Secondary | ICD-10-CM

## 2020-04-08 DIAGNOSIS — I152 Hypertension secondary to endocrine disorders: Secondary | ICD-10-CM

## 2020-04-08 DIAGNOSIS — E119 Type 2 diabetes mellitus without complications: Secondary | ICD-10-CM

## 2020-04-08 LAB — POCT GLYCOSYLATED HEMOGLOBIN (HGB A1C): Hemoglobin A1C: 6.2 % — AB (ref 4.0–5.6)

## 2020-04-08 MED ORDER — AZELASTINE HCL 0.1 % NA SOLN: 1.0000 | Freq: Two times a day (BID) | NASAL | 2 refills | Status: DC

## 2020-04-08 MED ORDER — FLUTICASONE PROPIONATE 50 MCG/ACT NA SUSP: 2.0000 | Freq: Every day | NASAL | 0 refills | Status: DC

## 2020-04-08 NOTE — Progress Notes (Signed)
Established Patient Office Visit  Subjective:  Patient ID: Dominique Brock, female    DOB: 1955/03/13  Age: 65 y.o. MRN: [id number]  CC:  Chief Complaint  Patient presents with  . Diabetes  . Hypertension    HPI Dominique Brock presents for diabetes mellitus and hypertension. Followed by Lyn Henri MD who manage her thyroid medication.  Diabetes: Pt denies increased urination or thirst. No hypoglycemic events. Has not been checking glucose at home recently. Reports recently went on a work trip to [place] [place] which was not a safe location and had to stay at the hotel, therefore, had to eat what the hotel restaurant offered. Reports increased stress the last few months related to work which is merging with another company.    HTN: Pt denies chest pain, palpitations, dizziness or leg swelling. Taking medication as directed without side effects. Pt follows a low salt diet. Tries to stay hydrated.  GERD: Taking omeprazole to help with heartburn symptoms. Pt reports remote history of hiatal hernia and PUD.  Allergies: Reports nasal spray has helped with year round allergies but received notification from insurance it will no longer be covered.  Past Medical History:  Diagnosis Date  . Asthma   . Diabetes mellitus    prediabetic diet controlled  . Eczema 06/15/2016  . Hypertension   . Hypothyroidism   . Sleep apnea    only with allergies  . Thyroid disease     Past Surgical History:  Procedure Laterality Date  . ABDOMINAL HYSTERECTOMY    . BILATERAL OOPHORECTOMY    . EXCISION MASS UPPER EXTREMETIES Left 04/18/2019   Procedure: Excision of left arm mass / lipoma;  Surgeon: Wallace Going, DO;  Location: [place];  Service: Plastics;  Laterality: Left;  . SINUS EXPLORATION  1996    Family History  Problem Relation Age of Onset  . Hypertension Mother   . Hyperlipidemia Mother   . Liver cancer Mother 8       died in 2011/05/23, at age 48 or 41.  . Allergies Mother    . Diabetes Mother 14       onset "mid fifties."  . Healthy Father   . Dementia Father 103       not officialy diagnosed  . Healthy Other        2 siblings  . Asthma Maternal Grandmother   . Diabetes Maternal Grandmother   . Asthma Cousin   . Allergic rhinitis Neg Hx   . Angioedema Neg Hx   . Eczema Neg Hx   . Immunodeficiency Neg Hx   . Urticaria Neg Hx     Social History   Socioeconomic History  . Marital status: Married    Spouse name: Not on file  . Number of children: 2  . Years of education: Not on file  . Highest education level: Not on file  Occupational History  . Not on file  Tobacco Use  . Smoking status: Never Smoker  . Smokeless tobacco: Never Used  Vaping Use  . Vaping Use: Never used  Substance and Sexual Activity  . Alcohol use: Yes    Comment: less than once a month  . Drug use: No  . Sexual activity: Yes  Other Topics Concern  . Not on file  Social History Narrative  . Not on file   Social Determinants of Health   Financial Resource Strain: Not on file  Food Insecurity: Not on file  Transportation Needs: Not on  file  Physical Activity: Not on file  Stress: Not on file  Social Connections: Not on file  Intimate Partner Violence: Not on file    Outpatient Medications Prior to Visit  Medication Sig Dispense Refill  . atenolol (TENORMIN) 25 MG tablet TAKE 1 TABLET BY MOUTH ONCE DAILY . APPOINTMENT REQUIRED FOR FUTURE REFILLS 60 tablet 0  . Azelastine-Fluticasone 137-50 MCG/ACT SUSP USE 1 SPRAY  TWICE DAILY IN EACH NOSTRIL FOLLOWING SINUS RINSE 23 g 0  . cholecalciferol (VITAMIN D3) 25 MCG (1000 UT) tablet Take 1,000 Units by mouth daily.    Marland Kitchen levothyroxine (SYNTHROID, LEVOTHROID) 100 MCG tablet Patient states she is taking 1/2 tablet once daily  2  . Multiple Vitamin (MULTIVITAMIN) tablet Take 1 tablet by mouth daily.    Marland Kitchen omeprazole (PRILOSEC) 20 MG capsule Take 1 capsule (20 mg total) by mouth daily. 90 capsule 0  . progesterone 200 MG SUPP  Place 100 mg vaginally at bedtime.    Marland Kitchen spironolactone (ALDACTONE) 25 MG tablet Take 25 mg by mouth daily.    Marland Kitchen atenolol (TENORMIN) 25 MG tablet Take 1 tablet (25 mg total) by mouth daily. 90 tablet 0  . ondansetron (ZOFRAN) 4 MG tablet Take 1 tablet (4 mg total) by mouth every 8 (eight) hours as needed for nausea or vomiting. (Patient not taking: Reported on 04/08/2020) 20 tablet 0   No facility-administered medications prior to visit.    Allergies  Allergen Reactions  . Azithromycin Rash    REACTION: rash  . Codeine Nausea And Vomiting  . Penicillins Rash  . Metronidazole Diarrhea  . Nitrofurantoin Macrocrystal   . Shellfish Allergy Rash    ROS Review of Systems A fourteen system review of systems was performed and found to be positive as per HPI.    Objective:    Physical Exam General:  Well Developed, well nourished, in no acute distress  Neuro:  Alert and oriented,  extra-ocular muscles intact  HEENT:  Normocephalic, atraumatic, neck supple Skin:  no gross rash, warm, pink. Cardiac:  RRR, S1 S2 Respiratory:  ECTA B/L w/o wheezing, Not using accessory muscles, speaking in full sentences- unlabored. Vascular:  Ext warm, no cyanosis apprec.; cap RF less 2 sec. Psych:  No HI/SI, judgement and insight good, Euthymic mood. Full Affect.   BP 121/70   Pulse 79   Temp 99.8 F (37.7 C)   Ht 5\' 5"  (1.651 m)   Wt 147 lb 6.4 oz (66.9 kg)   SpO2 99%   BMI 24.53 kg/m  Wt Readings from Last 3 Encounters:  04/08/20 147 lb 6.4 oz (66.9 kg)  12/11/19 144 lb 3.2 oz (65.4 kg)  08/10/19 140 lb (63.5 kg)     Health Maintenance Due  Topic Date Due  . Hepatitis C Screening  Never done  . PNEUMOCOCCAL POLYSACCHARIDE VACCINE AGE 63-64 HIGH RISK  Never done  . COVID-19 Vaccine (1) Never done  . FOOT EXAM  Never done  . HIV Screening  Never done  . COLONOSCOPY (Pts 45-98yrs Insurance coverage will need to be confirmed)  Never done  . MAMMOGRAM  10/30/2008  . INFLUENZA VACCINE   09/15/2019  . OPHTHALMOLOGY EXAM  02/18/2020  . URINE MICROALBUMIN  05/26/2020    There are no preventive care reminders to display for this patient.  Lab Results  Component Value Date   TSH 2.04 03/02/2018   Lab Results  Component Value Date   WBC 7.7 05/27/2019   HGB 14.4 05/27/2019   HCT  43.4 05/27/2019   MCV 97 05/27/2019   PLT 216 05/27/2019   Lab Results  Component Value Date   NA 141 12/11/2019   K 4.6 12/11/2019   CO2 24 12/11/2019   GLUCOSE 97 12/11/2019   BUN 21 12/11/2019   CREATININE 1.06 (H) 12/11/2019   BILITOT 0.5 12/11/2019   ALKPHOS 70 12/11/2019   AST 21 12/11/2019   ALT 25 12/11/2019   PROT 7.4 12/11/2019   ALBUMIN 4.4 12/11/2019   CALCIUM 9.9 12/11/2019   ANIONGAP 9 04/16/2019   Lab Results  Component Value Date   CHOL 189 12/11/2019   Lab Results  Component Value Date   HDL 45 12/11/2019   Lab Results  Component Value Date   LDLCALC 129 (H) 12/11/2019   Lab Results  Component Value Date   TRIG 83 12/11/2019   Lab Results  Component Value Date   CHOLHDL 4.2 12/11/2019   Lab Results  Component Value Date   HGBA1C 6.2 (A) 04/08/2020      Assessment & Plan:   Problem List Items Addressed This Visit      Cardiovascular and Mediastinum   Hypertension associated with diabetes ([place])     Digestive   Gastroesophageal reflux disease     Endocrine   Mixed 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 -Recommend to continue with monitoring carbohydrates and glucose. -Stay as active as possible. -Will  continue to monitor.  Hypertension associated with diabetes: -Controlled. -Continue current medication regimen -Follow low sodium diet and stay well hydrated. -Last CMP renal function stable, electrolytes normal.  Mixed diabetic hyperlipidemia associated with type 2 diabetes mellitus: -Last lipid panel: Total cholesterol 189, triglycerides 83, HDL 45, LDL 129 (goal <70) -Patient was on atorvastatin in the past. Advised to schedule lab visit to repeat lipid panel and if LDL remains above goal recommend restarting atorvastatin 10 mg. -Follow a heart healthy diet. -Will continue to monitor.  Gastroesophageal reflux disease: -Stable. -On omeprazole 20 mg. -Recommend to avoid provocative foods and elevate the head of the bed.  Environmental and seasonal allergies: -Will send individual prescriptions for combination nasal spray. If not covered by insurance, recommend contacting them to inquire about alternatives.    Meds ordered this encounter  Medications  . azelastine (ASTELIN) 0.1 % nasal spray    Sig: Place 1 spray into both nostrils 2 (two) times daily. Use in each nostril as directed    Dispense:  30 mL    Refill:  2    Order Specific Question:   Supervising Provider    Answer:   Beatrice Lecher D [[id number]]  . fluticasone (FLONASE) 50 MCG/ACT nasal spray    Sig: Place 2 sprays into both nostrils daily.    Dispense:  16 g    Refill:  0    Order Specific Question:   Supervising Provider    Answer:   Beatrice Lecher D [[id number]]    Follow-up: Return in about 4 months (around 08/06/2020) for DM, HTN, HLD; lab  visit for FBW inc Vit D this week or next.   Note:  This note was prepared with assistance of Dragon voice recognition software. Occasional wrong-word or sound-a-like substitutions may have occurred due to the inherent limitations of voice recognition software.  Lorrene Reid, PA-C

## 2020-04-08 NOTE — Patient Instructions (Signed)

## 2020-04-17 ENCOUNTER — Other Ambulatory Visit: Payer: Self-pay | Admitting: Physician Assistant

## 2020-04-17 DIAGNOSIS — E559 Vitamin D deficiency, unspecified: Secondary | ICD-10-CM

## 2020-04-17 DIAGNOSIS — E1159 Type 2 diabetes mellitus with other circulatory complications: Secondary | ICD-10-CM

## 2020-04-17 DIAGNOSIS — E119 Type 2 diabetes mellitus without complications: Secondary | ICD-10-CM

## 2020-04-17 DIAGNOSIS — I152 Hypertension secondary to endocrine disorders: Secondary | ICD-10-CM

## 2020-04-17 DIAGNOSIS — E1169 Type 2 diabetes mellitus with other specified complication: Secondary | ICD-10-CM

## 2020-04-17 DIAGNOSIS — Z0000 Encounter for general adult medical examination without abnormal findings: Secondary | ICD-10-CM

## 2020-04-17 DIAGNOSIS — E782 Mixed hyperlipidemia: Secondary | ICD-10-CM

## 2020-04-21 ENCOUNTER — Other Ambulatory Visit: Payer: Self-pay

## 2020-04-21 ENCOUNTER — Other Ambulatory Visit: Payer: 59

## 2020-04-21 DIAGNOSIS — Z0000 Encounter for general adult medical examination without abnormal findings: Secondary | ICD-10-CM

## 2020-04-21 DIAGNOSIS — E119 Type 2 diabetes mellitus without complications: Secondary | ICD-10-CM

## 2020-04-21 DIAGNOSIS — I152 Hypertension secondary to endocrine disorders: Secondary | ICD-10-CM

## 2020-04-21 DIAGNOSIS — E1169 Type 2 diabetes mellitus with other specified complication: Secondary | ICD-10-CM

## 2020-04-21 DIAGNOSIS — E1159 Type 2 diabetes mellitus with other circulatory complications: Secondary | ICD-10-CM

## 2020-04-21 DIAGNOSIS — E559 Vitamin D deficiency, unspecified: Secondary | ICD-10-CM

## 2020-04-22 LAB — COMPREHENSIVE METABOLIC PANEL
ALT: 24 IU/L (ref 0–32)
AST: 21 IU/L (ref 0–40)
Albumin/Globulin Ratio: 1.6 (ref 1.2–2.2)
Albumin: 4.2 g/dL (ref 3.8–4.8)
Alkaline Phosphatase: 55 IU/L (ref 44–121)
BUN/Creatinine Ratio: 23 (ref 12–28)
BUN: 23 mg/dL (ref 8–27)
Bilirubin Total: 0.6 mg/dL (ref 0.0–1.2)
CO2: 22 mmol/L (ref 20–29)
Calcium: 9.5 mg/dL (ref 8.7–10.3)
Chloride: 105 mmol/L (ref 96–106)
Creatinine, Ser: 1 mg/dL (ref 0.57–1.00)
Globulin, Total: 2.7 g/dL (ref 1.5–4.5)
Glucose: 103 mg/dL — ABNORMAL HIGH (ref 65–99)
Potassium: 4.7 mmol/L (ref 3.5–5.2)
Sodium: 140 mmol/L (ref 134–144)
Total Protein: 6.9 g/dL (ref 6.0–8.5)
eGFR: 63 mL/min/{1.73_m2} (ref >59)

## 2020-04-22 LAB — CBC
Hematocrit: 49.9 % — ABNORMAL HIGH (ref 34.0–46.6)
Hemoglobin: 16.6 g/dL — ABNORMAL HIGH (ref 11.1–15.9)
MCH: 31.8 pg (ref 26.6–33.0)
MCHC: 33.3 g/dL (ref 31.5–35.7)
MCV: 96 fL (ref 79–97)
Platelets: 238 10*3/uL (ref 150–450)
RBC: 5.22 x10E6/uL (ref 3.77–5.28)
RDW: 12.5 % (ref 11.7–15.4)
WBC: 8.2 10*3/uL (ref 3.4–10.8)

## 2020-04-22 LAB — LIPID PANEL
Chol/HDL Ratio: 4.7 ratio — ABNORMAL HIGH (ref 0.0–4.4)
Cholesterol, Total: 206 mg/dL — ABNORMAL HIGH (ref 100–199)
HDL: 44 mg/dL (ref >39)
LDL Chol Calc (NIH): 140 mg/dL — ABNORMAL HIGH (ref 0–99)
Triglycerides: 121 mg/dL (ref 0–149)
VLDL Cholesterol Cal: 22 mg/dL (ref 5–40)

## 2020-04-22 LAB — HEMOGLOBIN A1C
Est. average glucose Bld gHb Est-mCnc: 134 mg/dL
Hgb A1c MFr Bld: 6.3 % — ABNORMAL HIGH (ref 4.8–5.6)

## 2020-04-22 LAB — VITAMIN D 25 HYDROXY (VIT D DEFICIENCY, FRACTURES): Vit D, 25-Hydroxy: 50.1 ng/mL (ref 30.0–100.0)

## 2020-04-22 LAB — TSH: TSH: 1.33 u[IU]/mL (ref 0.450–4.500)

## 2020-04-24 LAB — SPECIMEN STATUS REPORT

## 2020-04-24 LAB — B12 AND FOLATE PANEL
Folate: 17.8 ng/mL (ref >3.0)
Vitamin B-12: 778 pg/mL (ref 232–1245)

## 2020-06-04 ENCOUNTER — Telehealth: Payer: Self-pay | Admitting: Physician Assistant

## 2020-06-04 DIAGNOSIS — I152 Hypertension secondary to endocrine disorders: Secondary | ICD-10-CM

## 2020-06-04 MED ORDER — ATENOLOL 25 MG PO TABS: 25.0000 mg | ORAL_TABLET | Freq: Every day | ORAL | 0 refills | Status: DC

## 2020-06-04 NOTE — Telephone Encounter (Signed)
Patient needs a refill and prior authorization for her atenolol and uses [facility] on [address]. Thanks

## 2020-06-04 NOTE — Telephone Encounter (Signed)
Refill sent to pharmacy. There is not a PA request in Cover My Meds at this time. AS, CMA

## 2020-06-04 NOTE — Addendum Note (Signed)
Addended by: Mickel Crow on: 06/04/2020 09:51 AM   Modules accepted: Orders

## 2020-07-06 ENCOUNTER — Other Ambulatory Visit: Payer: Self-pay

## 2020-07-06 ENCOUNTER — Telehealth: Payer: Self-pay | Admitting: Physician Assistant

## 2020-07-06 DIAGNOSIS — K219 Gastro-esophageal reflux disease without esophagitis: Secondary | ICD-10-CM

## 2020-07-06 MED ORDER — OMEPRAZOLE 20 MG PO CPDR: 20.0000 mg | DELAYED_RELEASE_CAPSULE | Freq: Every day | ORAL | 0 refills | Status: DC

## 2020-07-06 NOTE — Telephone Encounter (Signed)
Patient would like a refill on Prilosec and sent to [facility] on [address]. Thanks

## 2020-07-06 NOTE — Telephone Encounter (Signed)
Patient would like a refill

## 2020-07-06 NOTE — Telephone Encounter (Signed)
[facility]ill has been [facility] to the [facility] [address].  [place], [place]

## 2020-08-19 DIAGNOSIS — R87610 Atypical squamous cells of undetermined significance on cytologic smear of cervix (ASC-US): Secondary | ICD-10-CM | POA: Insufficient documentation

## 2020-09-15 ENCOUNTER — Ambulatory Visit: Payer: Managed Care, Other (non HMO) | Admitting: Orthopaedic Surgery

## 2020-09-17 ENCOUNTER — Telehealth: Payer: Self-pay | Admitting: Physician Assistant

## 2020-09-17 ENCOUNTER — Other Ambulatory Visit: Payer: Self-pay

## 2020-09-17 DIAGNOSIS — I152 Hypertension secondary to endocrine disorders: Secondary | ICD-10-CM

## 2020-09-17 DIAGNOSIS — E1159 Type 2 diabetes mellitus with other circulatory complications: Secondary | ICD-10-CM

## 2020-09-17 MED ORDER — ATENOLOL 25 MG PO TABS: 25.0000 mg | ORAL_TABLET | Freq: Every day | ORAL | 0 refills | Status: DC

## 2020-09-17 NOTE — Telephone Encounter (Signed)
Ben,   Please call the patient to notify refill has been sent and to schedule a follow up appointment per last AVS.  Thank you,  Gwyndolyn Saxon

## 2020-09-17 NOTE — Telephone Encounter (Signed)
Patient needs a refill on atenolol and would like [facility] to the [facility] [address] in [place], thanks.

## 2020-09-23 ENCOUNTER — Ambulatory Visit: Payer: Managed Care, Other (non HMO) | Admitting: Orthopaedic Surgery

## 2020-09-23 ENCOUNTER — Other Ambulatory Visit: Payer: Self-pay

## 2020-09-23 DIAGNOSIS — M1812 Unilateral primary osteoarthritis of first carpometacarpal joint, left hand: Secondary | ICD-10-CM | POA: Diagnosis not present

## 2020-09-23 DIAGNOSIS — M1811 Unilateral primary osteoarthritis of first carpometacarpal joint, right hand: Secondary | ICD-10-CM | POA: Diagnosis not present

## 2020-09-23 MED ORDER — METHYLPREDNISOLONE ACETATE 40 MG/ML IJ SUSP
40.0000 mg | INTRAMUSCULAR | Status: AC | PRN
  Administered 2020-09-23: 40 mg

## 2020-09-23 NOTE — Progress Notes (Signed)
Office Visit Note   Patient: Dominique Brock           Date of Birth: Jul 19, 1955           MRN: [id number] Visit Date: 09/23/2020              Requested by: Lorrene Reid, PA-C [address] [place],  [place] [id number] PCP: Lorrene Reid, PA-C   Assessment & Plan: Visit Diagnoses:  1. Primary osteoarthritis of first carpometacarpal joint of right hand   2. Primary osteoarthritis of first carpometacarpal joint of left hand     Plan: Impression is bilateral thumb CMC arthritis.  Bilateral injections performed today.  Patient tolerated this well.  She will continue to use the brace and medications as needed.  Follow-up as needed.  Follow-Up Instructions: No follow-ups on file.   Orders:  No orders of the defined types were placed in this encounter.  No orders of the defined types were placed in this encounter.     Procedures: Hand/UE Inj: bilateral thumb CMC for osteoarthritis on 09/23/2020 9:01 AM Indications: pain Details: 25 G needle Medications (Right): 40 mg methylPREDNISolone acetate 40 MG/ML Medications (Left): 40 mg methylPREDNISolone acetate 40 MG/ML Outcome: tolerated well, no immediate complications     Clinical Data: No additional findings.   Subjective: Chief Complaint  Patient presents with   Right Hand - Pain   Left Hand - Pain    HPI Dominique Brock returns today for follow-up of bilateral thumb pain due to [facility] arthritis worse on the right hand.  She has tried managing this with a conservative treatment such as medications and activity modifications and bracing since we saw her for this in November but unfortunately the pain has gotten worse and she is interested in injections today.  Review of Systems   Objective: Vital Signs: There were no vitals taken for this visit.  Physical Exam  Ortho Exam Bilateral thumbs show pain with grind test.  No crepitus with grind test. Specialty Comments:  No specialty comments  available.  Imaging: No results found.   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   Chronic sinusitis 09/19/2016   Environmental and seasonal allergies 06/15/2016   Anemia 06/15/2016   Eczema 06/15/2016   Hypothyroidism 02/05/2016   EPISTAXIS, RECURRENT 03/24/2009   BRONCHITIS, ACUTE 10/31/2008   Sinusitis, chronic 02/29/2008   ABDOMINAL PAIN, RIGHT UPPER QUADRANT 06/29/2007   Lipoma 06/12/2007   SYNCOPE, VASOVAGAL 06/12/2007   Moderate persistent asthma 02/17/2007   Obstructive sleep apnea, hx of 02/17/2007   Past Medical History:  Diagnosis Date   Asthma    Diabetes mellitus    prediabetic diet controlled   Eczema 06/15/2016   Hypertension    Hypothyroidism    Sleep apnea    only with allergies   Thyroid disease     Family History  Problem Relation Age of Onset   Hypertension Mother    Hyperlipidemia Mother    Liver cancer Mother 87       died in 05-10-11, at age 44 or 49.   Allergies Mother  Diabetes Mother 19       onset "mid 73."   Healthy Father    Dementia Father 59       not officialy diagnosed   Healthy Other        2 siblings   Asthma Maternal Grandmother    Diabetes Maternal Grandmother    Asthma Cousin    Allergic rhinitis Neg Hx    Angioedema Neg Hx    Eczema Neg Hx    Immunodeficiency Neg Hx    Urticaria Neg Hx     Past Surgical History:  Procedure Laterality Date   ABDOMINAL HYSTERECTOMY     BILATERAL OOPHORECTOMY     EXCISION MASS UPPER EXTREMETIES Left 04/18/2019   Procedure: Excision of  left arm mass / lipoma;  Surgeon: Wallace Going, DO;  Location: [facility];  Service: Plastics;  Laterality: Left;   SINUS EXPLORATION  1996   Social History   Occupational History   Not on file  Tobacco Use   Smoking status: Never   Smokeless tobacco: Never  Vaping Use   Vaping Use: Never used  Substance and Sexual Activity   Alcohol use: Yes    Comment: less than once a month   Drug use: No   Sexual activity: Yes

## 2020-09-24 ENCOUNTER — Ambulatory Visit: Payer: Managed Care, Other (non HMO) | Admitting: Physician Assistant

## 2020-09-30 ENCOUNTER — Ambulatory Visit (INDEPENDENT_AMBULATORY_CARE_PROVIDER_SITE_OTHER): Payer: Managed Care, Other (non HMO) | Admitting: Physician Assistant

## 2020-09-30 ENCOUNTER — Encounter: Payer: Self-pay | Admitting: Physician Assistant

## 2020-09-30 ENCOUNTER — Other Ambulatory Visit: Payer: Self-pay

## 2020-09-30 VITALS — BP 115/72 | HR 83 | Temp 97.4°F | Ht 65.0 in | Wt 145.4 lb

## 2020-09-30 DIAGNOSIS — E782 Mixed hyperlipidemia: Secondary | ICD-10-CM

## 2020-09-30 DIAGNOSIS — N898 Other specified noninflammatory disorders of vagina: Secondary | ICD-10-CM | POA: Insufficient documentation

## 2020-09-30 DIAGNOSIS — F5101 Primary insomnia: Secondary | ICD-10-CM | POA: Insufficient documentation

## 2020-09-30 DIAGNOSIS — G9332 Myalgic encephalomyelitis/chronic fatigue syndrome: Secondary | ICD-10-CM | POA: Insufficient documentation

## 2020-09-30 DIAGNOSIS — I152 Hypertension secondary to endocrine disorders: Secondary | ICD-10-CM

## 2020-09-30 DIAGNOSIS — R232 Flushing: Secondary | ICD-10-CM | POA: Insufficient documentation

## 2020-09-30 DIAGNOSIS — E1169 Type 2 diabetes mellitus with other specified complication: Secondary | ICD-10-CM | POA: Diagnosis not present

## 2020-09-30 DIAGNOSIS — Z87898 Personal history of other specified conditions: Secondary | ICD-10-CM | POA: Insufficient documentation

## 2020-09-30 DIAGNOSIS — Z6823 Body mass index (BMI) 23.0-23.9, adult: Secondary | ICD-10-CM | POA: Insufficient documentation

## 2020-09-30 DIAGNOSIS — Z780 Asymptomatic menopausal state: Secondary | ICD-10-CM | POA: Insufficient documentation

## 2020-09-30 DIAGNOSIS — R6882 Decreased libido: Secondary | ICD-10-CM | POA: Insufficient documentation

## 2020-09-30 DIAGNOSIS — E7800 Pure hypercholesterolemia, unspecified: Secondary | ICD-10-CM | POA: Insufficient documentation

## 2020-09-30 DIAGNOSIS — L709 Acne, unspecified: Secondary | ICD-10-CM | POA: Insufficient documentation

## 2020-09-30 DIAGNOSIS — E1159 Type 2 diabetes mellitus with other circulatory complications: Secondary | ICD-10-CM | POA: Diagnosis not present

## 2020-09-30 DIAGNOSIS — E119 Type 2 diabetes mellitus without complications: Secondary | ICD-10-CM

## 2020-09-30 DIAGNOSIS — R5382 Chronic fatigue, unspecified: Secondary | ICD-10-CM | POA: Insufficient documentation

## 2020-09-30 DIAGNOSIS — N951 Menopausal and female climacteric states: Secondary | ICD-10-CM | POA: Insufficient documentation

## 2020-09-30 LAB — POCT GLYCOSYLATED HEMOGLOBIN (HGB A1C): Hemoglobin A1C: 6.1 % — AB (ref 4.0–5.6)

## 2020-09-30 MED ORDER — ATORVASTATIN CALCIUM 10 MG PO TABS: 10.0000 mg | ORAL_TABLET | Freq: Every day | ORAL | 1 refills | Status: DC

## 2020-09-30 MED ORDER — ATENOLOL 25 MG PO TABS: 25.0000 mg | ORAL_TABLET | Freq: Every day | ORAL | 0 refills | Status: DC

## 2020-09-30 NOTE — Assessment & Plan Note (Addendum)
-  A1c today has improved from 6.3 to 6.1, continue low carbohydrate and glucose diet. -Patient unable to provide urine sample for microalbumin, will attempt at follow up visit. -Will continue to monitor.

## 2020-09-30 NOTE — Assessment & Plan Note (Signed)
-  Last lipid panel: total cholesterol 206, triglycerides 121, HDL 44, LDL 140 (goal <70). -Will send rx for atorvastatin 10 mg. Recommend to continue with low fat diet. -Will repeat lipid panel and hepatic function at follow up visit.

## 2020-09-30 NOTE — Patient Instructions (Signed)

## 2020-09-30 NOTE — Progress Notes (Signed)
Established Patient Office Visit  Subjective:  Patient ID: Dominique Brock, female    DOB: 01-10-1956  Age: 65 y.o. MRN: [id number]  CC:  Chief Complaint  Patient presents with   Hypertension   Diabetes   Hyperlipidemia    HPI Dominique Brock presents for follow up on diabetes mellitus, hypertension and hyperlipidemia. Patient has no acute concerns.  Diabetes: Pt denies increased urination or thirst. Pt continues to monitor carbohydrates and sugar intake. Reports a hypoglycemic event when working outside on the weekend and going a couple hours without eating. No recent episodes. Checking glucose at home. Continues to have work stress.  HTN: Pt denies chest pain, palpitations, dizziness or lower extremity swelling. Taking medication as directed without side effects. Has not checked BP at home recently. Pt follows a low salt diet. Reports good hydration.  HLD: Pt states is agreeable to restart atorvastatin 10 mg. States her diet consists of low fat and cholesterol is more hereditary vs poor diet. Tolerated medication without issues.   Past Medical History:  Diagnosis Date   Asthma    Diabetes mellitus    prediabetic diet controlled   Eczema 06/15/2016   Hypertension    Hypothyroidism    Sleep apnea    only with allergies   Thyroid disease     Past Surgical History:  Procedure Laterality Date   ABDOMINAL HYSTERECTOMY     BILATERAL OOPHORECTOMY     EXCISION MASS UPPER EXTREMETIES Left 04/18/2019   Procedure: Excision of left arm mass / lipoma;  Surgeon: Wallace Going, DO;  Location: [place];  Service: Plastics;  Laterality: Left;   SINUS EXPLORATION  1996    Family History  Problem Relation Age of Onset   Hypertension Mother    Hyperlipidemia Mother    Liver cancer Mother 64       died in 05-07-2011, at age 52 or 44.   Allergies Mother    Diabetes Mother 42       onset "mid 65."   Healthy Father    Dementia Father 98       not officialy  diagnosed   Healthy Other        2 siblings   Asthma Maternal Grandmother    Diabetes Maternal Grandmother    Asthma Cousin    Allergic rhinitis Neg Hx    Angioedema Neg Hx    Eczema Neg Hx    Immunodeficiency Neg Hx    Urticaria Neg Hx     Social History   Socioeconomic History   Marital status: Married    Spouse name: Not on file   Number of children: 2   Years of education: Not on file   Highest education level: Not on file  Occupational History   Not on file  Tobacco Use   Smoking status: Never   Smokeless tobacco: Never  Vaping Use   Vaping Use: Never used  Substance and Sexual Activity   Alcohol use: Yes    Comment: less than once a month   Drug use: No   Sexual activity: Yes  Other Topics Concern   Not on file  Social History Narrative   Not on file   Social Determinants of Health   Financial Resource Strain: Not on file  Food Insecurity: Not on file  Transportation Needs: Not on file  Physical Activity: Not on file  Stress: Not on file  Social Connections: Not on file  Intimate Partner Violence: Not on file  Outpatient Medications Prior to Visit  Medication Sig Dispense Refill   azelastine (ASTELIN) 0.1 % nasal spray Place 1 spray into both nostrils 2 (two) times daily. Use in each nostril as directed 30 mL 2   cholecalciferol (VITAMIN D3) 25 MCG (1000 UT) tablet Take 1,000 Units by mouth daily.     fluticasone (FLONASE) 50 MCG/ACT nasal spray Place 2 sprays into both nostrils daily. 16 g 0   levothyroxine (SYNTHROID, LEVOTHROID) 100 MCG tablet Patient states she is taking 1/2 tablet once daily  2   Multiple Vitamin (MULTIVITAMIN) tablet Take 1 tablet by mouth daily.     omeprazole (PRILOSEC) 20 MG capsule Take 1 capsule (20 mg total) by mouth daily. 90 capsule 0   progesterone 200 MG SUPP Place 100 mg vaginally at bedtime.     spironolactone (ALDACTONE) 25 MG tablet Take 25 mg by mouth daily.     atenolol (TENORMIN) 25 MG tablet Take 1 tablet (25  mg total) by mouth daily. 90 tablet 0   Azelastine-Fluticasone 137-50 MCG/ACT SUSP USE 1 SPRAY  TWICE DAILY IN EACH NOSTRIL FOLLOWING SINUS RINSE 23 g 0   ondansetron (ZOFRAN) 4 MG tablet Take 1 tablet (4 mg total) by mouth every 8 (eight) hours as needed for nausea or vomiting. (Patient not taking: Reported on 04/08/2020) 20 tablet 0   No facility-administered medications prior to visit.    Allergies  Allergen Reactions   Azithromycin Rash    REACTION: rash   Codeine Nausea And Vomiting   Penicillins Rash   Metronidazole Diarrhea   Nitrofurantoin Macrocrystal    Shellfish Allergy Rash    ROS Review of Systems Review of Systems:  A fourteen system review of systems was performed and found to be positive as per HPI.   Objective:    Physical Exam General:  Well Developed, well nourished, appropriate for stated age.  Neuro:  Alert and oriented,  extra-ocular muscles intact  HEENT:  Normocephalic, atraumatic, neck supple  Skin:  no gross rash, warm, pink. Cardiac:  RRR Respiratory:  CTA B/L w/o wheezing, crackles or rales, Not using accessory muscles, speaking in full sentences- unlabored. Vascular:  Ext warm, no cyanosis apprec.; cap RF less 2 sec. No edema  Psych:  No HI/SI, judgement and insight good, Euthymic mood. Full Affect.  BP 115/72   Pulse 83   Temp (!) 97.4 F (36.3 C)   Ht '5\' 5"'  (1.651 m)   Wt 145 lb 6.4 oz (66 kg)   SpO2 98%   BMI 24.20 kg/m  Wt Readings from Last 3 Encounters:  09/30/20 145 lb 6.4 oz (66 kg)  04/08/20 147 lb 6.4 oz (66.9 kg)  12/11/19 144 lb 3.2 oz (65.4 kg)     Health Maintenance Due  Topic Date Due   PNEUMOCOCCAL POLYSACCHARIDE VACCINE AGE 60-64 HIGH RISK  Never done   COVID-19 Vaccine (1) Never done   Pneumococcal Vaccine 26-53 Years old (1 - PCV) Never done   FOOT EXAM  Never done   HIV Screening  Never done   Hepatitis C Screening  Never done   Zoster Vaccines- Shingrix (1 of 2) Never done   COLONOSCOPY (Pts 45-68yr Insurance  coverage will need to be confirmed)  Never done   MAMMOGRAM  10/30/2008   OPHTHALMOLOGY EXAM  02/18/2020   URINE MICROALBUMIN  05/26/2020   INFLUENZA VACCINE  09/14/2020    There are no preventive care reminders to display for this patient.  Lab Results  Component Value Date  TSH 1.330 04/21/2020   Lab Results  Component Value Date   WBC 8.2 04/21/2020   HGB 16.6 (H) 04/21/2020   HCT 49.9 (H) 04/21/2020   MCV 96 04/21/2020   PLT 238 04/21/2020   Lab Results  Component Value Date   NA 140 04/21/2020   K 4.7 04/21/2020   CO2 22 04/21/2020   GLUCOSE 103 (H) 04/21/2020   BUN 23 04/21/2020   CREATININE 1.00 04/21/2020   BILITOT 0.6 04/21/2020   ALKPHOS 55 04/21/2020   AST 21 04/21/2020   ALT 24 04/21/2020   PROT 6.9 04/21/2020   ALBUMIN 4.2 04/21/2020   CALCIUM 9.5 04/21/2020   ANIONGAP 9 04/16/2019   EGFR 63 04/21/2020   Lab Results  Component Value Date   CHOL 206 (H) 04/21/2020   Lab Results  Component Value Date   HDL 44 04/21/2020   Lab Results  Component Value Date   LDLCALC 140 (H) 04/21/2020   Lab Results  Component Value Date   TRIG 121 04/21/2020   Lab Results  Component Value Date   CHOLHDL 4.7 (H) 04/21/2020   Lab Results  Component Value Date   HGBA1C 6.1 (A) 09/30/2020      Assessment & Plan:   Problem List Items Addressed This Visit       Cardiovascular and Mediastinum   Hypertension associated with diabetes ([place])    -Controlled. -Continue current medication regimen. Will continue to monitor.      Relevant Medications   atenolol (TENORMIN) 25 MG tablet   atorvastatin (LIPITOR) 10 MG tablet     Endocrine   Diabetes mellitus ([place]) - Primary    -A1c today has improved from 6.3 to 6.1, continue low carbohydrate and glucose diet. -Patient unable to provide urine sample for microalbumin, will attempt at follow up visit. -Will continue to monitor.      Relevant Medications   atorvastatin (LIPITOR) 10 MG tablet   Other  Relevant Orders   POCT glycosylated hemoglobin (Hb A1C) (Completed)   Mixed diabetic hyperlipidemia associated with type 2 diabetes mellitus ([place])    -Last lipid panel: total cholesterol 206, triglycerides 121, HDL 44, LDL 140 (goal <70). -Will send rx for atorvastatin 10 mg. Recommend to continue with low fat diet. -Will repeat lipid panel and hepatic function at follow up visit.      Relevant Medications   atenolol (TENORMIN) 25 MG tablet   atorvastatin (LIPITOR) 10 MG tablet      Meds ordered this encounter  Medications   atenolol (TENORMIN) 25 MG tablet    Sig: Take 1 tablet (25 mg total) by mouth daily.    Dispense:  90 tablet    Refill:  0   atorvastatin (LIPITOR) 10 MG tablet    Sig: Take 1 tablet (10 mg total) by mouth daily.    Dispense:  90 tablet    Refill:  1    Follow-up: Return in about 4 months (around 01/30/2021) for DM, HTN, HLD with FBW few days before (exclude TSH).    Lorrene Reid, PA-C

## 2020-09-30 NOTE — Assessment & Plan Note (Signed)
-  Controlled. Continue current medication regimen. Will continue to monitor. 

## 2020-10-05 ENCOUNTER — Other Ambulatory Visit: Payer: Self-pay | Admitting: Physician Assistant

## 2020-10-05 DIAGNOSIS — K219 Gastro-esophageal reflux disease without esophagitis: Secondary | ICD-10-CM

## 2020-12-30 ENCOUNTER — Other Ambulatory Visit: Payer: Self-pay | Admitting: Physician Assistant

## 2020-12-30 DIAGNOSIS — K219 Gastro-esophageal reflux disease without esophagitis: Secondary | ICD-10-CM

## 2020-12-30 DIAGNOSIS — I152 Hypertension secondary to endocrine disorders: Secondary | ICD-10-CM

## 2021-01-05 ENCOUNTER — Other Ambulatory Visit: Payer: Self-pay | Admitting: Physician Assistant

## 2021-01-05 DIAGNOSIS — J3089 Other allergic rhinitis: Secondary | ICD-10-CM

## 2021-03-31 ENCOUNTER — Other Ambulatory Visit: Payer: Self-pay | Admitting: Physician Assistant

## 2021-03-31 DIAGNOSIS — E1169 Type 2 diabetes mellitus with other specified complication: Secondary | ICD-10-CM

## 2021-03-31 DIAGNOSIS — E782 Mixed hyperlipidemia: Secondary | ICD-10-CM

## 2021-04-29 ENCOUNTER — Other Ambulatory Visit: Payer: Self-pay | Admitting: Physician Assistant

## 2021-05-13 ENCOUNTER — Other Ambulatory Visit: Payer: Self-pay | Admitting: Physician Assistant

## 2021-05-13 DIAGNOSIS — E1169 Type 2 diabetes mellitus with other specified complication: Secondary | ICD-10-CM

## 2021-06-28 ENCOUNTER — Telehealth: Payer: Self-pay | Admitting: Physician Assistant

## 2021-06-28 ENCOUNTER — Other Ambulatory Visit: Payer: Self-pay | Admitting: Physician Assistant

## 2021-06-28 DIAGNOSIS — K219 Gastro-esophageal reflux disease without esophagitis: Secondary | ICD-10-CM

## 2021-06-28 DIAGNOSIS — J3089 Other allergic rhinitis: Secondary | ICD-10-CM

## 2021-06-28 MED ORDER — AZELASTINE HCL 0.1 % NA SOLN: 1.0000 | Freq: Two times a day (BID) | NASAL | 2 refills | Status: DC

## 2021-06-28 NOTE — Telephone Encounter (Signed)
Done. AS, CMA °

## 2021-06-28 NOTE — Telephone Encounter (Signed)
Patient has not been seen since 09/2020 and advised to follow up in 4 months.  ? ?I can sent in a 30 day supply of RX but patient will need to schedule apt for refills.  ? ?Please advise patient, verify which pharmacy and which medications need refill. AS, CMA ?

## 2021-06-28 NOTE — Telephone Encounter (Signed)
Patient called back stating only one of her nose sprays were called in and she needs both, the other being Flonase and she wants them to go to [facility] off of [address].  ?

## 2021-06-28 NOTE — Telephone Encounter (Signed)
Patient is scheduled for next week and the only medication she needs right now is the nasal spray for her allergies.  ?

## 2021-06-28 NOTE — Telephone Encounter (Signed)
Patient is requesting refills of her medications and is asking they be sent to [facility] for a 90 day supply. Also requesting a dexcom? Please advise.  ?

## 2021-06-29 ENCOUNTER — Other Ambulatory Visit: Payer: Self-pay | Admitting: Physician Assistant

## 2021-06-29 DIAGNOSIS — I152 Hypertension secondary to endocrine disorders: Secondary | ICD-10-CM

## 2021-07-02 NOTE — Patient Instructions (Incomplete)

## 2021-07-08 ENCOUNTER — Ambulatory Visit: Payer: Managed Care, Other (non HMO) | Admitting: Physician Assistant

## 2021-07-28 ENCOUNTER — Ambulatory Visit (INDEPENDENT_AMBULATORY_CARE_PROVIDER_SITE_OTHER): Payer: Managed Care, Other (non HMO) | Admitting: Physician Assistant

## 2021-07-28 ENCOUNTER — Encounter: Payer: Self-pay | Admitting: Physician Assistant

## 2021-07-28 VITALS — BP 129/69 | HR 67 | Temp 97.7°F | Ht 64.0 in | Wt 149.0 lb

## 2021-07-28 DIAGNOSIS — E1159 Type 2 diabetes mellitus with other circulatory complications: Secondary | ICD-10-CM

## 2021-07-28 DIAGNOSIS — E1169 Type 2 diabetes mellitus with other specified complication: Secondary | ICD-10-CM | POA: Diagnosis not present

## 2021-07-28 DIAGNOSIS — I152 Hypertension secondary to endocrine disorders: Secondary | ICD-10-CM

## 2021-07-28 DIAGNOSIS — J3089 Other allergic rhinitis: Secondary | ICD-10-CM

## 2021-07-28 DIAGNOSIS — E782 Mixed hyperlipidemia: Secondary | ICD-10-CM

## 2021-07-28 DIAGNOSIS — E559 Vitamin D deficiency, unspecified: Secondary | ICD-10-CM

## 2021-07-28 LAB — POCT GLYCOSYLATED HEMOGLOBIN (HGB A1C): Hemoglobin A1C: 7 % — AB (ref 4.0–5.6)

## 2021-07-28 MED ORDER — DEXCOM G6 RECEIVER DEVI: 0 refills | Status: DC

## 2021-07-28 MED ORDER — DEXCOM G6 TRANSMITTER MISC: 3 refills | Status: DC

## 2021-07-28 MED ORDER — DEXCOM G6 SENSOR MISC: 6 refills | Status: DC

## 2021-07-28 MED ORDER — FLUTICASONE PROPIONATE 50 MCG/ACT NA SUSP: NASAL | 1 refills | Status: DC

## 2021-07-28 NOTE — Assessment & Plan Note (Signed)
-  Stable. -Continue current medication regimen.  -Will continue to monitor. 

## 2021-07-28 NOTE — Assessment & Plan Note (Signed)
-  Discussed with patient A1c today which has increased from 6.1 to 7.0. Patient prefers to work on dietary and lifestyle changes. Previously did not tolerate Metformin. Will send rx for Dexcom G6. Will continue to monitor.

## 2021-07-28 NOTE — Progress Notes (Signed)
Established patient visit   Patient: Dominique Brock   DOB: 1955-07-31   66 y.o. Female  MRN: [id number] Visit Date: 07/28/2021  Chief Complaint  Patient presents with   Diabetes   Subjective    HPI  Patient presents for chronic follow-up visit.  Diabetes mellitus: Pt denies increased urination or thirst. Pt reports medication compliance. Patient reports feels like her sugars decrease when she is working outside in the yard and improve after she eats something. States contacted her insurance and they do cover for Dexcom which she would like to try. Patient does not check sugars at home. Patient reports has not been exercising and not as good with her diet.   HTN: Pt denies chest pain, palpitations, dizziness, headache or lower extremity swelling. Taking medication as directed without side effects.   HLD: Pt taking medication without issues but has ran out of medication for a few weeks.   Allergies: Reports using Flonase and Astelin together which help with her allergies.   Medications: Outpatient Medications Prior to Visit  Medication Sig   atenolol (TENORMIN) 25 MG tablet TAKE 1 TABLET (25 MG TOTAL) BY MOUTH DAILY.   atorvastatin (LIPITOR) 10 MG tablet TAKE 1 TABLET (10 MG TOTAL) BY MOUTH DAILY. **CALL TO SCHEDULE A FOLLOW UP FOR FUTURE MED REFILLS**   azelastine (ASTELIN) 0.1 % nasal spray Place 1 spray into both nostrils 2 (two) times daily. Use in each nostril as directed   cholecalciferol (VITAMIN D3) 25 MCG (1000 UT) tablet Take 1,000 Units by mouth daily.   levothyroxine (SYNTHROID, LEVOTHROID) 100 MCG tablet Patient states she is taking 1/2 tablet once daily   Multiple Vitamin (MULTIVITAMIN) tablet Take 1 tablet by mouth daily.   omeprazole (PRILOSEC) 20 MG capsule TAKE 1 CAPSULE BY MOUTH EVERY DAY   progesterone 200 MG SUPP Place 100 mg vaginally at bedtime.   spironolactone (ALDACTONE) 25 MG tablet Take 25 mg by mouth daily.   [DISCONTINUED] fluticasone (FLONASE) 50  MCG/ACT nasal spray Use 2 spray(s) in each nostril once daily   No facility-administered medications prior to visit.    Review of Systems Review of Systems:  A fourteen system review of systems was performed and found to be positive as per HPI.     Objective    BP 129/69   Pulse 67   Temp 97.7 F (36.5 C)   Ht _0  (1.626 m)   Wt 149 lb (67.6 kg)   SpO2 98%   BMI 25.58 kg/m  BP Readings from Last 3 Encounters:  07/28/21 129/69  09/30/20 115/72  04/08/20 121/70   Wt Readings from Last 3 Encounters:  07/28/21 149 lb (67.6 kg)  09/30/20 145 lb 6.4 oz (66 kg)  04/08/20 147 lb 6.4 oz (66.9 kg)    Physical Exam  General:  Well Developed, well nourished, appropriate for stated age.  Neuro:  Alert and oriented,  extra-ocular muscles intact  HEENT:  Normocephalic, atraumatic, neck supple  Skin:  no gross rash, warm, pink. Cardiac:  RRR, S1 S2 Respiratory: CTA B/L  Vascular:  Ext warm, no cyanosis apprec.; cap RF less 2 sec. Psych:  No HI/SI, judgement and insight good, Euthymic mood. Full Affect.   Results for orders placed or performed in visit on 07/28/21  POCT glycosylated hemoglobin (Hb A1C)  Result Value Ref Range   Hemoglobin A1C 7.0 (A) 4.0 - 5.6 %   HbA1c POC (<> result, manual entry)     HbA1c, POC (prediabetic range)  HbA1c, POC (controlled diabetic range)      Assessment & Plan      Problem List Items Addressed This Visit       Cardiovascular and Mediastinum   Hypertension associated with diabetes ([place])    -Stable. -Continue current medication regimen. Patient wants prescriptions to be sent for 90 day supply to mail pharmacy but unsure which mail pharmacy so will send [place] the information via mychart. -Advised to schedule a lab visit for fasting labs including CMP.  -Will continue to monitor.      Relevant Orders   CBC w/Diff   Comp Met (CMET)     Endocrine   Diabetes mellitus ([employer])    -Discussed with patient A1c today which has increased  from 6.1 to 7.0. Patient prefers to work on dietary and lifestyle changes. Previously did not tolerate Metformin. Will send rx for Dexcom G6. Will continue to monitor.      Relevant Medications   fluticasone (FLONASE) 50 MCG/ACT nasal spray   Continuous Blood Gluc Receiver (DEXCOM G6 RECEIVER) DEVI   Continuous Blood Gluc Transmit (DEXCOM G6 TRANSMITTER) MISC   Continuous Blood Gluc Sensor (DEXCOM G6 SENSOR) MISC   Other Relevant Orders   POCT glycosylated hemoglobin (Hb A1C) (Completed)   CBC w/Diff   Comp Met (CMET)   Urine Microalbumin w/creat. ratio   Mixed diabetic hyperlipidemia associated with type 2 diabetes mellitus (HCC) - Primary    -Last lipid panel LDL 140 and patient was started on atorvastatin 10 mg. Will repeat lipid panel and hepatic function with lab visit. Will continue to monitor.      Relevant Orders   CBC w/Diff   Comp Met (CMET)   Lipid Profile     Other   Environmental and seasonal allergies    -Stable. -Continue current medication regimen. -Will continue to monitor.      Relevant Medications   fluticasone (FLONASE) 50 MCG/ACT nasal spray   Vitamin D deficiency   Relevant Orders   Vitamin D (25 hydroxy)    Return in about 4 months (around 11/27/2021) for DM, HTN, HLD; lab visi tin 1-4 weeks for FBW (not A1c).        Lorrene Reid, PA-C  [facility] [phone] (phone) [phone] (fax)  Rafael Gonzalez

## 2021-07-28 NOTE — Patient Instructions (Signed)
Diabetes Mellitus and Foot Care Foot care is an important part of your health, especially when you have diabetes. Diabetes may cause you to have problems because of poor blood flow (circulation) to your feet and legs, which can cause your skin to: Become thinner and drier. Break more easily. Heal more slowly. Peel and crack. You may also have nerve damage (neuropathy) in your legs and feet, causing decreased feeling in them. This means that you may not notice minor injuries to your feet that could lead to more serious problems. Noticing and addressing any potential problems early is the best way to prevent future foot problems. How to care for your feet Foot hygiene  Wash your feet daily with warm water and mild soap. Do not use hot water. Then, pat your feet and the areas between your toes until they are completely dry. Do not soak your feet as this can dry your skin. Trim your toenails straight across. Do not dig under them or around the cuticle. File the edges of your nails with an emery board or nail file. Apply a moisturizing lotion or petroleum jelly to the skin on your feet and to dry, brittle toenails. Use lotion that does not contain alcohol and is unscented. Do not apply lotion between your toes. Shoes and socks Wear clean socks or stockings every day. Make sure they are not too tight. Do not wear knee-high stockings since they may decrease blood flow to your legs. Wear shoes that fit properly and have enough cushioning. Always look in your shoes before you put them on to be sure there are no objects inside. To break in new shoes, wear them for just a few hours a day. This prevents injuries on your feet. Wounds, scrapes, corns, and calluses  Check your feet daily for blisters, cuts, bruises, sores, and redness. If you cannot see the bottom of your feet, use a mirror or ask someone for help. Do not cut corns or calluses or try to remove them with medicine. If you find a minor scrape,  cut, or break in the skin on your feet, keep it and the skin around it clean and dry. You may clean these areas with mild soap and water. Do not clean the area with peroxide, alcohol, or iodine. If you have a wound, scrape, corn, or callus on your foot, look at it several times a day to make sure it is healing and not infected. Check for: Redness, swelling, or pain. Fluid or blood. Warmth. Pus or a bad smell. General tips Do not cross your legs. This may decrease blood flow to your feet. Do not use heating pads or hot water bottles on your feet. They may burn your skin. If you have lost feeling in your feet or legs, you may not know this is happening until it is too late. Protect your feet from hot and cold by wearing shoes, such as at the beach or on hot pavement. Schedule a complete foot exam at least once a year (annually) or more often if you have foot problems. Report any cuts, sores, or bruises to your health care provider immediately. Where to find more information American Diabetes Association: www.diabetes.org Association of Diabetes Care & Education Specialists: www.diabeteseducator.org Contact a health care provider if: You have a medical condition that increases your risk of infection and you have any cuts, sores, or bruises on your feet. You have an injury that is not healing. You have redness on your legs or feet. You   feel burning or tingling in your legs or feet. You have pain or cramps in your legs and feet. Your legs or feet are numb. Your feet always feel cold. You have pain around any toenails. Get help right away if: You have a wound, scrape, corn, or callus on your foot and: You have pain, swelling, or redness that gets worse. You have fluid or blood coming from the wound, scrape, corn, or callus. Your wound, scrape, corn, or callus feels warm to the touch. You have pus or a bad smell coming from the wound, scrape, corn, or callus. You have a fever. You have a red  line going up your leg. Summary Check your feet every day for blisters, cuts, bruises, sores, and redness. Apply a moisturizing lotion or petroleum jelly to the skin on your feet and to dry, brittle toenails. Wear shoes that fit properly and have enough cushioning. If you have foot problems, report any cuts, sores, or bruises to your health care provider immediately. Schedule a complete foot exam at least once a year (annually) or more often if you have foot problems. This information is not intended to replace advice given to you by your health care provider. Make sure you discuss any questions you have with your health care provider. Document Revised: 08/22/2019 Document Reviewed: 08/22/2019 Elsevier Patient Education  2023 Elsevier Inc.  

## 2021-07-28 NOTE — Assessment & Plan Note (Signed)
-  Stable. -Continue current medication regimen. Patient wants prescriptions to be sent for 90 day supply to mail pharmacy but unsure which mail pharmacy so will send [place] the information via mychart. -Advised to schedule a lab visit for fasting labs including CMP.  -Will continue to monitor.

## 2021-07-28 NOTE — Assessment & Plan Note (Signed)
-  Last lipid panel LDL 140 and patient was started on atorvastatin 10 mg. Will repeat lipid panel and hepatic function with lab visit. Will continue to monitor.

## 2021-07-28 NOTE — Telephone Encounter (Signed)
close

## 2021-07-29 ENCOUNTER — Other Ambulatory Visit: Payer: Self-pay | Admitting: Physician Assistant

## 2021-07-29 DIAGNOSIS — E1159 Type 2 diabetes mellitus with other circulatory complications: Secondary | ICD-10-CM

## 2021-08-02 LAB — HM COLONOSCOPY

## 2021-08-04 ENCOUNTER — Telehealth: Payer: Self-pay | Admitting: Physician Assistant

## 2021-08-04 NOTE — Telephone Encounter (Signed)
Patient called and has some questions and concerns about the Dexcom. Her sugar levels are fluctuating all over. Please advise. [phone]

## 2021-08-04 NOTE — Telephone Encounter (Signed)
Called pt she is advised to continue with her routine of exercise low carbs and drink plenty of water

## 2021-08-04 NOTE — Telephone Encounter (Signed)
I saw that Dominique Brock just started her wearing the Dexcom monitor. How high and how low are the sugars?  We may want to consider adding a different medication than metformin as she hadn't tolerated metformin well in the past .

## 2021-08-09 ENCOUNTER — Other Ambulatory Visit: Payer: Self-pay | Admitting: Physician Assistant

## 2021-08-09 DIAGNOSIS — Z0000 Encounter for general adult medical examination without abnormal findings: Secondary | ICD-10-CM

## 2021-08-09 DIAGNOSIS — E1159 Type 2 diabetes mellitus with other circulatory complications: Secondary | ICD-10-CM

## 2021-08-09 DIAGNOSIS — E1169 Type 2 diabetes mellitus with other specified complication: Secondary | ICD-10-CM

## 2021-08-10 ENCOUNTER — Other Ambulatory Visit: Payer: Managed Care, Other (non HMO)

## 2021-08-27 ENCOUNTER — Other Ambulatory Visit: Payer: Self-pay | Admitting: Physician Assistant

## 2021-08-27 DIAGNOSIS — J3089 Other allergic rhinitis: Secondary | ICD-10-CM

## 2021-08-27 DIAGNOSIS — E1169 Type 2 diabetes mellitus with other specified complication: Secondary | ICD-10-CM

## 2021-09-01 ENCOUNTER — Telehealth: Payer: Self-pay | Admitting: Orthopaedic Surgery

## 2021-09-01 NOTE — Telephone Encounter (Signed)
Pt is having severe pain in her hand, she does not want the injection again , as it did not work. Please call and advise what she can do for the pain

## 2021-09-01 NOTE — Telephone Encounter (Signed)
Does she think she's ready for surgery or does she want to continue to try to manage it with medications and braces.

## 2021-09-01 NOTE — Telephone Encounter (Signed)
Called patient. She has multiple questions regarding surgery. I have scheduled her an appointment to come in and discuss.

## 2021-09-07 ENCOUNTER — Ambulatory Visit (INDEPENDENT_AMBULATORY_CARE_PROVIDER_SITE_OTHER): Payer: Managed Care, Other (non HMO)

## 2021-09-07 ENCOUNTER — Ambulatory Visit: Payer: Managed Care, Other (non HMO) | Admitting: Orthopaedic Surgery

## 2021-09-07 ENCOUNTER — Ambulatory Visit: Payer: Self-pay

## 2021-09-07 DIAGNOSIS — M1812 Unilateral primary osteoarthritis of first carpometacarpal joint, left hand: Secondary | ICD-10-CM

## 2021-09-07 DIAGNOSIS — M1811 Unilateral primary osteoarthritis of first carpometacarpal joint, right hand: Secondary | ICD-10-CM | POA: Diagnosis not present

## 2021-09-07 NOTE — Progress Notes (Signed)
Office Visit Note   Patient: Dominique Brock           Date of Birth: 1955/10/12           MRN: [id number] Visit Date: 09/07/2021              Requested by: Lorrene Reid, PA-C [place],  [place] [address] PCP: Lorrene Reid, PA-C   Assessment & Plan: Visit Diagnoses:  1. Primary osteoarthritis of first carpometacarpal joint of right hand   2. Primary osteoarthritis of first carpometacarpal joint of left hand     Plan: Impression is advanced bilateral thumb CMC osteoarthritis.  Treatment options were reviewed and explained in detail at this point she will have to decide if she wants to continue conservative management or undergo [facility] arthroplasty particularly for the left hand.  She has Debbie's card who she will call when she is ready for surgery.  Otherwise she can follow-up as needed.  Follow-Up Instructions: No follow-ups on file.   Orders:  Orders Placed This Encounter  Procedures   XR Finger Thumb Left   XR Finger Thumb Right   No orders of the defined types were placed in this encounter.     Procedures: No procedures performed   Clinical Data: No additional findings.   Subjective: Chief Complaint  Patient presents with   Right Hand - Pain   Left Hand - Pain    HPI Kyira returns today for follow-up of bilateral thumb CMC arthritis.  I saw her almost a year ago for these problems and we did bilateral thumb CMC injections.  The right side has improved significantly but the left side received minimal relief.  Not interested in repeating injections.  Right-hand-dominant.  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  Review of Systems   Objective: Vital Signs: There were no vitals taken for this visit.  Physical Exam  Ortho Exam Examination of bilateral thumbs is unchanged. Specialty Comments:  No specialty comments available.  Imaging: XR Finger Thumb Left  Result Date: 09/07/2021 Advanced thumb  CMC osteoarthritis.   XR Finger Thumb Right  Result Date: 09/07/2021 Advanced degenerative changes to the thumb CMC joint.    PMFS History: Patient Active Problem List   Diagnosis Date Noted   Acne 09/30/2020   Body mass index (BMI) 23.0-23.9, adult 09/30/2020   Chronic fatigue syndrome 09/30/2020   History of weight gain 09/30/2020   Hot flashes 09/30/2020   Low libido 09/30/2020   Female climacteric state 09/30/2020   Menopause 09/30/2020   Primary insomnia 09/30/2020   Pure hypercholesterolemia 09/30/2020   Vaginal dryness 09/30/2020   Diabetes mellitus ([place]) 03/01/2019   Mixed diabetic hyperlipidemia associated with type 2 diabetes mellitus ([place]) 03/01/2019   Vitamin D deficiency 03/01/2019   Lipoma of left axilla 03/13/2018   Hypertension associated with diabetes ([place]) 01/15/2018   Gastroesophageal reflux disease 01/15/2018   Perennial allergic rhinitis 09/19/2016   Rhinitis medicamentosa 09/19/2016   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   Chronic sinusitis 09/19/2016   Environmental and seasonal allergies 06/15/2016   Anemia 06/15/2016   Eczema 06/15/2016   Hypothyroidism 02/05/2016   EPISTAXIS, RECURRENT 03/24/2009   BRONCHITIS, ACUTE 10/31/2008   Sinusitis, chronic 02/29/2008   ABDOMINAL PAIN, RIGHT UPPER QUADRANT 06/29/2007   Lipoma 06/12/2007   SYNCOPE, VASOVAGAL 06/12/2007   Moderate persistent asthma 02/17/2007   Obstructive sleep apnea, hx of 02/17/2007   Past Medical History:  Diagnosis Date   Asthma    Diabetes mellitus    prediabetic diet controlled   Eczema 06/15/2016   Hypertension    Hypothyroidism    Sleep apnea    only with allergies   Thyroid disease     Family History  Problem Relation Age of Onset   Hypertension Mother    Hyperlipidemia Mother    Liver cancer Mother 47       died in 05/19/2011, at age 31 or 56.   Allergies Mother    Diabetes Mother 39       onset "mid 56."   Healthy Father    Dementia Father 48        not officialy diagnosed   Healthy Other        2 siblings   Asthma Maternal Grandmother    Diabetes Maternal Grandmother    Asthma Cousin    Allergic rhinitis Neg Hx    Angioedema Neg Hx    Eczema Neg Hx    Immunodeficiency Neg Hx    Urticaria Neg Hx     Past Surgical History:  Procedure Laterality Date   ABDOMINAL HYSTERECTOMY     BILATERAL OOPHORECTOMY     EXCISION MASS UPPER EXTREMETIES Left 04/18/2019   Procedure: Excision of left arm mass / lipoma;  Surgeon: Wallace Going, DO;  Location: [place];  Service: Plastics;  Laterality: Left;   SINUS EXPLORATION  1996   Social History   Occupational History   Not on file  Tobacco Use   Smoking status: Never   Smokeless tobacco: Never  Vaping Use   Vaping Use: Never used  Substance and Sexual Activity   Alcohol use: Yes    Comment: less than once a month   Drug use: No   Sexual activity: Yes

## 2021-09-20 ENCOUNTER — Telehealth: Payer: Self-pay | Admitting: Orthopaedic Surgery

## 2021-09-20 NOTE — Telephone Encounter (Signed)
Patient seen a couple of weeks ago and was told to call when ready to schedule surgery Left CMC arthroplasty.  Please provide surgery sheet.  Patient wants to get an idea of cost prior to scheduling and most likely will opt for the procedure in September.

## 2021-09-21 NOTE — Telephone Encounter (Signed)
Will leave on your desk.  Thanks.

## 2021-09-27 ENCOUNTER — Other Ambulatory Visit: Payer: Self-pay | Admitting: Physician Assistant

## 2021-09-27 DIAGNOSIS — K219 Gastro-esophageal reflux disease without esophagitis: Secondary | ICD-10-CM

## 2021-10-19 ENCOUNTER — Other Ambulatory Visit: Payer: Self-pay | Admitting: Physician Assistant

## 2021-10-19 MED ORDER — OXYCODONE-ACETAMINOPHEN 5-325 MG PO TABS: 1.0000 | ORAL_TABLET | Freq: Four times a day (QID) | ORAL | 0 refills | Status: DC | PRN

## 2021-10-19 MED ORDER — ONDANSETRON HCL 4 MG PO TABS: 4.0000 mg | ORAL_TABLET | Freq: Three times a day (TID) | ORAL | 0 refills | Status: DC | PRN

## 2021-10-21 DIAGNOSIS — M1812 Unilateral primary osteoarthritis of first carpometacarpal joint, left hand: Secondary | ICD-10-CM | POA: Diagnosis not present

## 2021-10-25 IMAGING — MR MR HUMERUS*L* WO/W CM
4 of 9 series · 19 of 40 positions shown · IV contrast (Y)
Comparison: Ultrasound 01/16/2019

CLINICAL DATA: Evaluate left upper arm mass.

EXAM:
MRI OF THE LEFT HUMERUS WITHOUT AND WITH CONTRAST
TECHNIQUE: Multiplanar, multisequence MR imaging of the left upper extremity
was performed before and after the administration of intravenous
contrast.
CONTRAST:  7mL GADAVIST GADOBUTROL 1 MMOL/ML IV SOLN

[Series 4: T1 · axial · 5.0mm · 0.43mm/px · z∈[-185,+87]mm · 7 of 55 slices shown (1 of 3)]
[im 1/55]
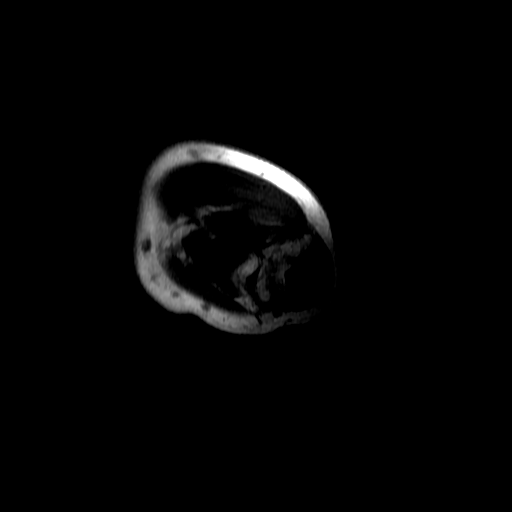
[im 10/55]
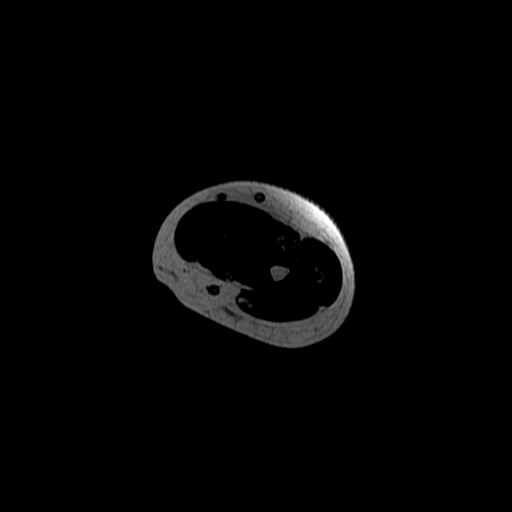
[im 19/55]
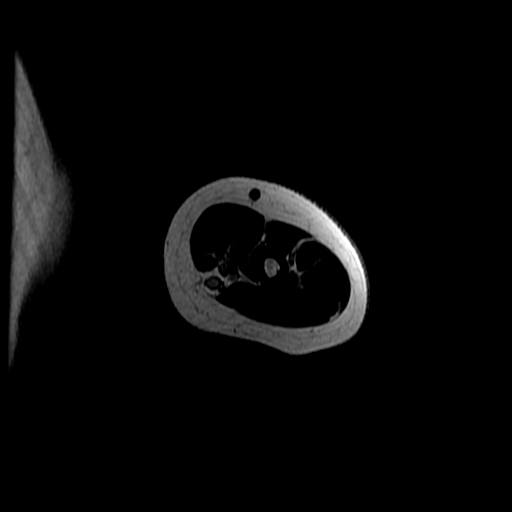
[im 28/55]
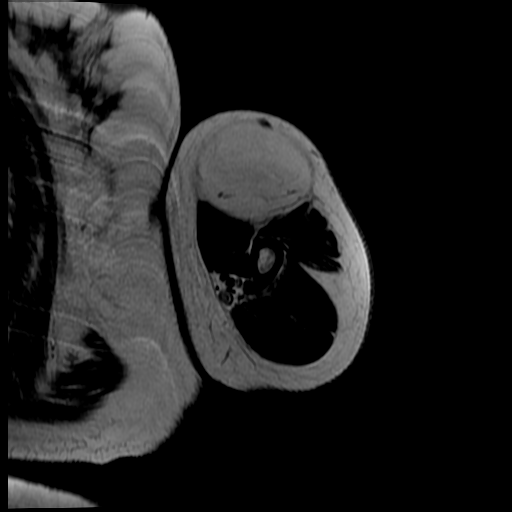
[im 37/55]
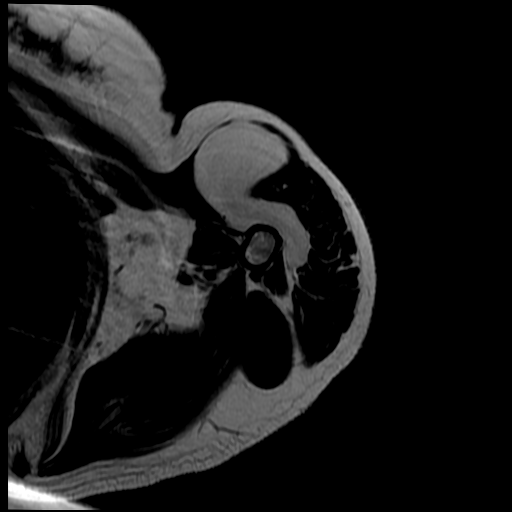
[im 46/55]
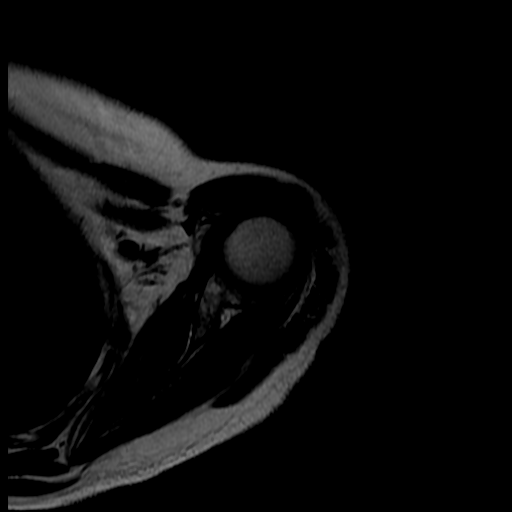
[im 55/55]
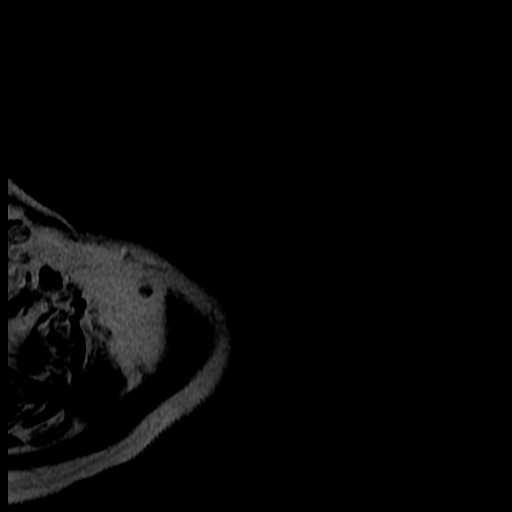

[Series 5: T2 fat-sat · axial · 5.0mm · 0.43mm/px · z∈[-185,+87]mm · 6 of 55 slices shown]
[im 1/55]
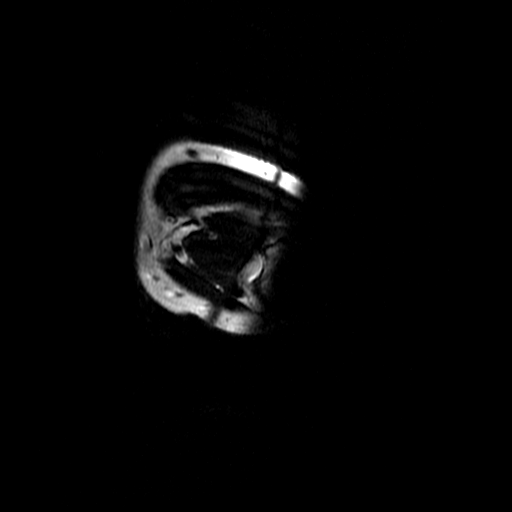
[im 11/55]
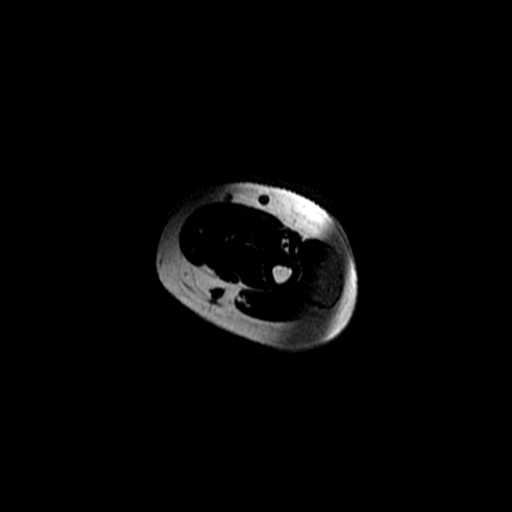
[im 22/55]
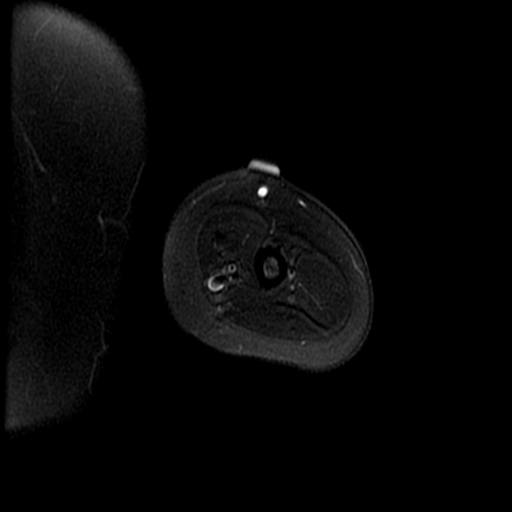
[im 33/55]
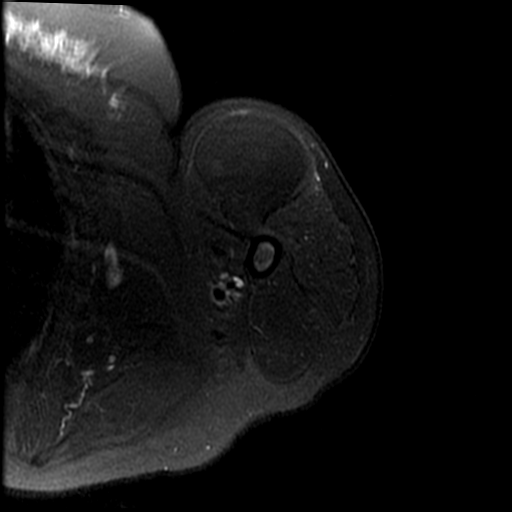
[im 44/55]
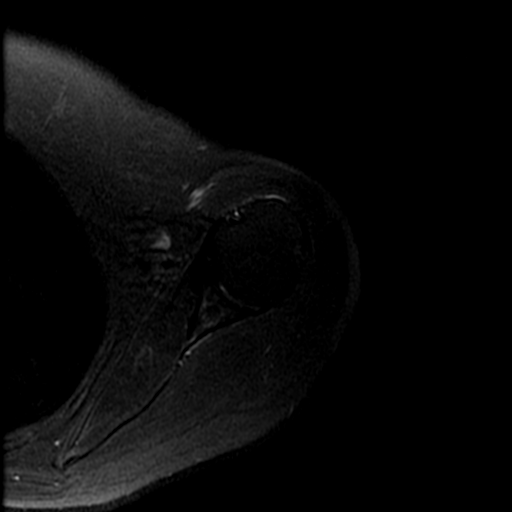
[im 55/55]
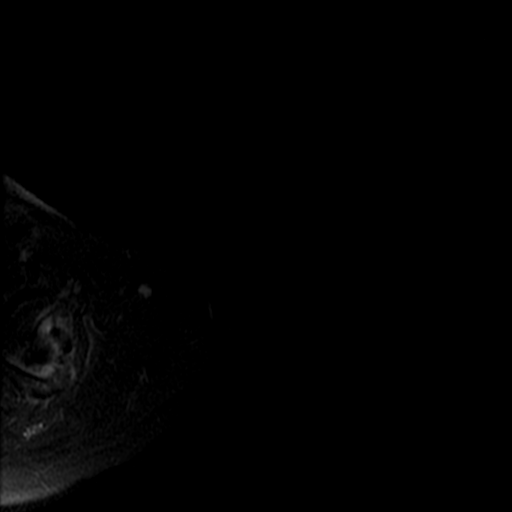

[Series 6: T1 · coronal · 4.0mm · 0.64mm/px · 3 of 33 slices shown (2 of 3)]
[im 1/33]
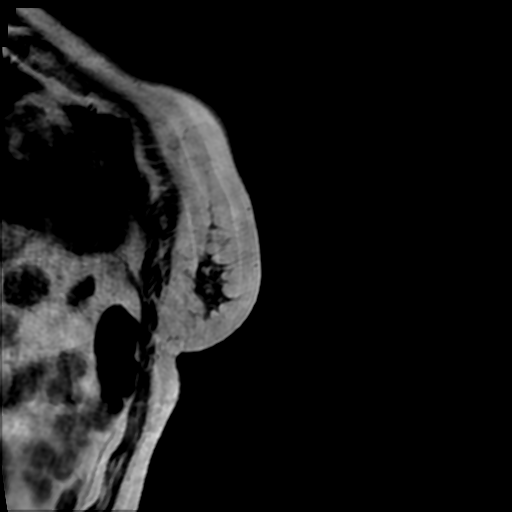
[im 17/33]
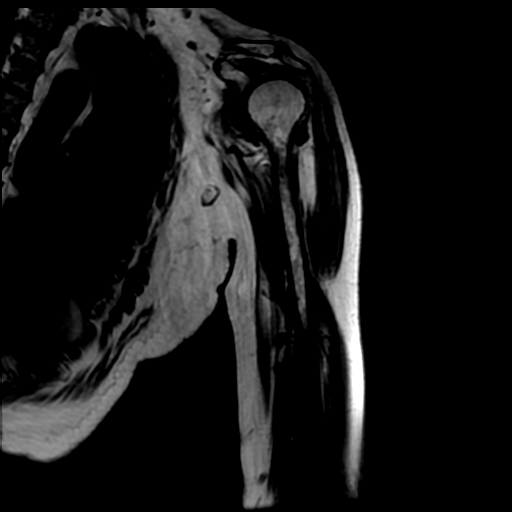
[im 33/33]
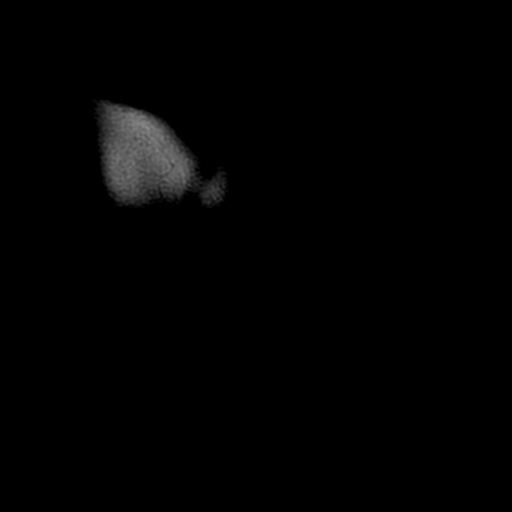

[Series 8: T1 · sagittal · 4.0mm · 0.72mm/px · 3 of 30 slices shown (3 of 3)]
[im 1/30]
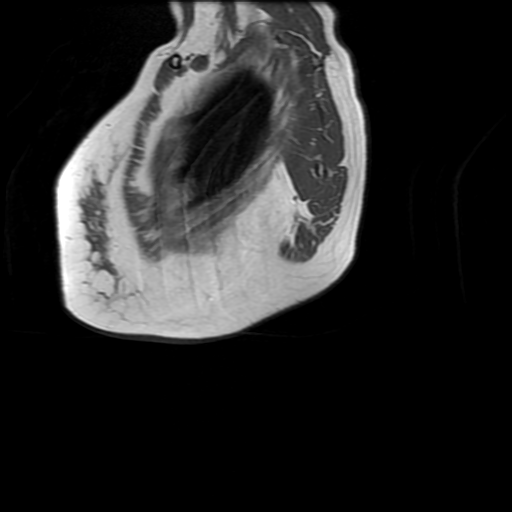
[im 15/30]
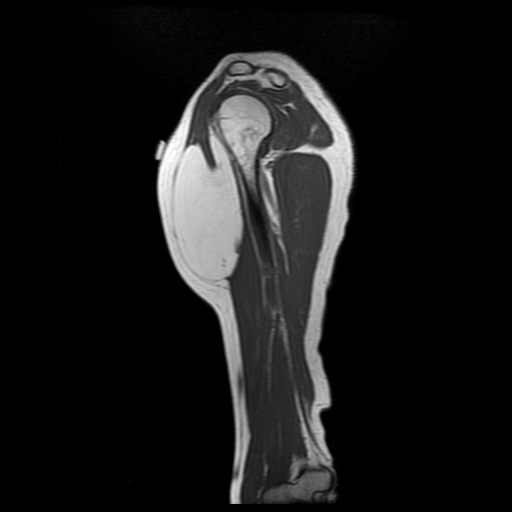
[im 30/30]
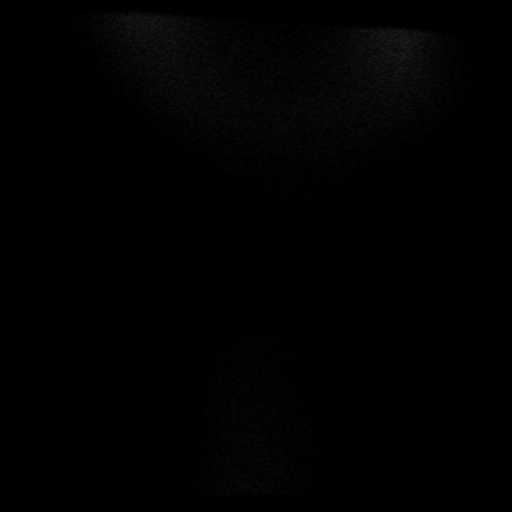

[19 of 40 positions shown; findings below may reference images not displayed]

FINDINGS: There is a homogeneously fatty mass involving the anterior aspect of
the left upper extremity correlating with the patient's palpable
abnormality in the recent ultrasound examination. This measures
x 5.3 x 5.3 cm. It is located between the lateral deltoid muscle and
the humeral shaft and extends into the anterior deltoid muscle
bulging into the subcutaneous tissues. It appears well encapsulated
and there are no worrisome areas of soft tissue nodularity or
enhancement to suggest a complex lipoma or liposarcoma.

The anterior deltoid muscle is splayed but no surrounding
inflammation or edema or enhancement.

The humerus is unremarkable. The rotator cuff tendons appear normal.
The long head biceps tendon is intact. No axillary adenopathy.
IMPRESSION: Large simple appearing lipoma accounting for the patient's palpable
abnormality. No worrisome or aggressive MR imaging features.

## 2021-10-28 ENCOUNTER — Encounter: Payer: Managed Care, Other (non HMO) | Admitting: Orthopaedic Surgery

## 2021-11-04 ENCOUNTER — Ambulatory Visit (INDEPENDENT_AMBULATORY_CARE_PROVIDER_SITE_OTHER): Payer: Managed Care, Other (non HMO)

## 2021-11-04 ENCOUNTER — Ambulatory Visit: Payer: Managed Care, Other (non HMO) | Admitting: Orthopaedic Surgery

## 2021-11-04 ENCOUNTER — Telehealth: Payer: Self-pay | Admitting: Orthopaedic Surgery

## 2021-11-04 ENCOUNTER — Encounter: Payer: Self-pay | Admitting: Orthopaedic Surgery

## 2021-11-04 DIAGNOSIS — M1812 Unilateral primary osteoarthritis of first carpometacarpal joint, left hand: Secondary | ICD-10-CM

## 2021-11-04 DIAGNOSIS — M1811 Unilateral primary osteoarthritis of first carpometacarpal joint, right hand: Secondary | ICD-10-CM

## 2021-11-04 NOTE — Telephone Encounter (Signed)
Patient called asking if she would still be able to to PT with a hard cast on her hand. CB # [phone]

## 2021-11-04 NOTE — Progress Notes (Signed)
Post-Op Visit Note   Patient: Dominique Brock           Date of Birth: 29-Jan-1956           MRN: [id number] Visit Date: 11/04/2021 PCP: Lorrene Reid, PA-C   Assessment & Plan:  Chief Complaint:  Chief Complaint  Patient presents with   Left Hand - Follow-up    Left thumb [facility] arthroplasty 10/21/2021   Visit Diagnoses:  1. Primary osteoarthritis of first carpometacarpal joint of right hand     Plan: Bailey Mech is 2 weeks status post left thumb CMC arthroplasty.  Doing well.  Takes Advil for the pain.  No complaints.  Examination of the left hand shows healed surgical incision.  No signs of infection.  Expected postoperative stiffness to the thumb.  Brisk capillary refill.  Sutures removed Steri-Strips applied.  Thumb spica cast was applied.  We will come back in 2 weeks for cast removal and initiation of hand therapy and conversion to a hand-based thermoplastic splint.  Referral has been made.  Follow-Up Instructions: No follow-ups on file.   Orders:  Orders Placed This Encounter  Procedures   XR Hand Complete Left   Ambulatory referral to Occupational Therapy   No orders of the defined types were placed in this encounter.   Imaging: No results found.  PMFS History: Patient Active Problem List   Diagnosis Date Noted   Acne 09/30/2020   Body mass index (BMI) 23.0-23.9, adult 09/30/2020   Chronic fatigue syndrome 09/30/2020   History of weight gain 09/30/2020   Hot flashes 09/30/2020   Low libido 09/30/2020   Female climacteric state 09/30/2020   Menopause 09/30/2020   Primary insomnia 09/30/2020   Pure hypercholesterolemia 09/30/2020   Vaginal dryness 09/30/2020   Diabetes mellitus ([place]) 03/01/2019   Mixed diabetic hyperlipidemia associated with type 2 diabetes mellitus ([place]) 03/01/2019   Vitamin D deficiency 03/01/2019   Lipoma of left axilla 03/13/2018   Hypertension associated with diabetes ([place]) 01/15/2018   Gastroesophageal reflux disease 01/15/2018    Perennial allergic rhinitis 09/19/2016   Rhinitis medicamentosa 09/19/2016   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   Chronic sinusitis 09/19/2016   Environmental and seasonal allergies 06/15/2016   Anemia 06/15/2016   Eczema 06/15/2016   Hypothyroidism 02/05/2016   EPISTAXIS, RECURRENT 03/24/2009   BRONCHITIS, ACUTE 10/31/2008   Sinusitis, chronic 02/29/2008   ABDOMINAL PAIN, RIGHT UPPER QUADRANT 06/29/2007   Lipoma 06/12/2007   SYNCOPE, VASOVAGAL 06/12/2007   Moderate persistent asthma 02/17/2007   Obstructive sleep apnea, hx of 02/17/2007   Past Medical History:  Diagnosis Date   Asthma    Diabetes mellitus    prediabetic diet controlled   Eczema 06/15/2016   Hypertension    Hypothyroidism    Sleep apnea    only with allergies   Thyroid disease     Family History  Problem Relation Age of Onset   Hypertension Mother    Hyperlipidemia Mother    Liver cancer Mother 4       died in 2011/05/08, at age 58 or 89.   Allergies Mother    Diabetes Mother 52       onset "mid 58."   Healthy Father    Dementia Father 11       not officialy diagnosed   Healthy Other        2 siblings   Asthma Maternal Grandmother    Diabetes Maternal Grandmother    Asthma Cousin    Allergic rhinitis  Neg Hx    Angioedema Neg Hx    Eczema Neg Hx    Immunodeficiency Neg Hx    Urticaria Neg Hx     Past Surgical History:  Procedure Laterality Date   ABDOMINAL HYSTERECTOMY     BILATERAL OOPHORECTOMY     EXCISION MASS UPPER EXTREMETIES Left 04/18/2019   Procedure: Excision of left arm mass / lipoma;  Surgeon: Wallace Going, DO;  Location: [place];  Service: Plastics;  Laterality: Left;   SINUS EXPLORATION  1996   Social History   Occupational History   Not on file  Tobacco Use   Smoking status: Never   Smokeless tobacco: Never  Vaping Use   Vaping Use: Never used  Substance and Sexual Activity   Alcohol use: Yes    Comment: less than once a month    Drug use: No   Sexual activity: Yes

## 2021-11-05 NOTE — Telephone Encounter (Signed)
SW pt, informed her that she will need to do PT/OT on or after 11/18/21 with her f/u with Dr. Erlinda Hong. She cannot do therapy with a cast on. Can you please reschedule her PT/OT appointment for upstairs please? She is currently on the schedule for that on 11/17/21. Thank you!!

## 2021-11-17 ENCOUNTER — Encounter: Payer: Managed Care, Other (non HMO) | Admitting: Rehabilitative and Restorative Service Providers"

## 2021-11-17 ENCOUNTER — Encounter: Payer: Self-pay | Admitting: Rehabilitative and Restorative Service Providers"

## 2021-11-17 ENCOUNTER — Ambulatory Visit (INDEPENDENT_AMBULATORY_CARE_PROVIDER_SITE_OTHER): Payer: Managed Care, Other (non HMO) | Admitting: Physician Assistant

## 2021-11-17 ENCOUNTER — Other Ambulatory Visit: Payer: Self-pay

## 2021-11-17 ENCOUNTER — Ambulatory Visit: Payer: Managed Care, Other (non HMO) | Admitting: Rehabilitative and Restorative Service Providers"

## 2021-11-17 DIAGNOSIS — M1812 Unilateral primary osteoarthritis of first carpometacarpal joint, left hand: Secondary | ICD-10-CM

## 2021-11-17 DIAGNOSIS — M25642 Stiffness of left hand, not elsewhere classified: Secondary | ICD-10-CM | POA: Diagnosis not present

## 2021-11-17 DIAGNOSIS — R600 Localized edema: Secondary | ICD-10-CM | POA: Diagnosis not present

## 2021-11-17 DIAGNOSIS — M6281 Muscle weakness (generalized): Secondary | ICD-10-CM | POA: Diagnosis not present

## 2021-11-17 DIAGNOSIS — M25542 Pain in joints of left hand: Secondary | ICD-10-CM

## 2021-11-17 DIAGNOSIS — R278 Other lack of coordination: Secondary | ICD-10-CM

## 2021-11-17 NOTE — Therapy (Signed)
OUTPATIENT OCCUPATIONAL THERAPY ORTHO EVALUATION  Patient Name: Dominique Brock MRN: [id number] DOB:10/03/1955, 66 y.o., female Today's Date: 11/17/2021  PCP: Lorrene Reid, PA-C REFERRING PROVIDER: Leandrew Koyanagi, MD   OT End of Session - 11/17/21 1022     Visit Number 1    Date for OT Re-Evaluation 12/31/21    Authorization Type [employer] $40 copay    OT Start Time 1115    OT Stop Time 1222    OT Time Calculation (min) 67 min    Equipment Utilized During Treatment orthotic materials    Activity Tolerance Patient tolerated treatment well;No increased pain;Patient limited by fatigue;Patient limited by pain    Behavior During Therapy [facility] for tasks assessed/performed             Past Medical History:  Diagnosis Date   Asthma    Diabetes mellitus    prediabetic diet controlled   Eczema 06/15/2016   Hypertension    Hypothyroidism    Sleep apnea    only with allergies   Thyroid disease    Past Surgical History:  Procedure Laterality Date   ABDOMINAL HYSTERECTOMY     BILATERAL OOPHORECTOMY     EXCISION MASS UPPER EXTREMETIES Left 04/18/2019   Procedure: Excision of left arm mass / lipoma;  Surgeon: Wallace Going, DO;  Location: [place];  Service: Plastics;  Laterality: Left;   SINUS EXPLORATION  1996   Patient Active Problem List   Diagnosis Date Noted   Acne 09/30/2020   Body mass index (BMI) 23.0-23.9, adult 09/30/2020   Chronic fatigue syndrome 09/30/2020   History of weight gain 09/30/2020   Hot flashes 09/30/2020   Low libido 09/30/2020   Female climacteric state 09/30/2020   Menopause 09/30/2020   Primary insomnia 09/30/2020   Pure hypercholesterolemia 09/30/2020   Vaginal dryness 09/30/2020   Diabetes mellitus ([place]) 03/01/2019   Mixed diabetic hyperlipidemia associated with type 2 diabetes mellitus ([place]) 03/01/2019   Vitamin D deficiency 03/01/2019   Lipoma of left axilla 03/13/2018   Hypertension associated with diabetes ([place])  01/15/2018   Gastroesophageal reflux disease 01/15/2018   Perennial allergic rhinitis 09/19/2016   Rhinitis medicamentosa 09/19/2016   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   Chronic sinusitis 09/19/2016   Environmental and seasonal allergies 06/15/2016   Anemia 06/15/2016   Eczema 06/15/2016   Hypothyroidism 02/05/2016   EPISTAXIS, RECURRENT 03/24/2009   BRONCHITIS, ACUTE 10/31/2008   Sinusitis, chronic 02/29/2008   ABDOMINAL PAIN, RIGHT UPPER QUADRANT 06/29/2007   Lipoma 06/12/2007   SYNCOPE, VASOVAGAL 06/12/2007   Moderate persistent asthma 02/17/2007   Obstructive sleep apnea, hx of 02/17/2007    ONSET DATE: DOS: 10/21/21  REFERRING DIAG: M18.11 (ICD-10-CM) - Primary osteoarthritis of first carpometacarpal joint of right hand  THERAPY DIAG:  Stiffness of left hand, not elsewhere classified - Plan: Ot plan of care cert/re-cert  Localized edema - Plan: Ot plan of care cert/re-cert  Muscle weakness (generalized) - Plan: Ot plan of care cert/re-cert  Other lack of coordination - Plan: Ot plan of care cert/re-cert  Pain in joint of left hand - Plan: Ot plan of care cert/re-cert  Rationale for Evaluation and Treatment Rehabilitation  SUBJECTIVE:   SUBJECTIVE STATEMENT: 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 She states hard time opening jars, typing, lifting/holding things, etc.  She arrives in pre-fab wrist cock-up and steristrips over sx area.    PERTINENT HISTORY: Per MD "Eval and treat s/p  left thumb CMC arthroplasty.  Hand rehab and thermoplastic hand based splint to begin 10/5 or 10/6."   PRECAUTIONS: ~4 weeks post-op now, follow post-op protocols: A/ROM to thumb and P/AROM to wrist as tolerated, @ 5 weeks, PROM to thumb, _0  weeks light isometrics & weaning from orthotic, PRE @ 8 weeks, caution until 10-12 weeks    WEIGHT BEARING RESTRICTIONS Yes NWB Lt thumb now  PAIN:  Are you having pain? Yes Rating: 1/10 at rest now, up to  3/10 in past week when typing or doing things around the house  FALLS: Has patient fallen in last 6 months? No  LIVING ENVIRONMENT: Lives with: lives with their spouse   PLOF: Independent  PATIENT GOALS: Get left hand better for life activities   OBJECTIVE:   HAND DOMINANCE: Right   ADLs: Overall ADLs: States decreased ability to grab, hold household objects, pain and inability to open containers, perform FMS tasks (manipulate fasteners on clothing), mild to moderate bathing problems as well.    FUNCTIONAL OUTCOME MEASURES: Eval: Patient Specific Functional Scale: 1.6 (sew, typing, opening jar)  (Higher Score  =  Better Ability for the Selected Tasks)      UPPER EXTREMITY ROM     Shoulder to Wrist AROM Left eval  Forearm supination Approx 90  Forearm pronation  Approx 90  Wrist flexion 27  Wrist extension 50  Wrist ulnar deviation 26  Wrist radial deviation 4  Functional dart thrower's motion (F-DTM) in ulnar flexion   F-DTM in radial extension    (Blank rows = not tested)   Hand AROM Left eval  Full Fist Ability (or Gap to Distal Palmar Crease) Loose full fist (lack of MCP J motion)   Thumb Opposition to Small Finger (or Gap) 3.8cm  Thumb Opposition to Base of Small Finger (or Gap)  7cm  Thumb MCP (0-60) 15  Thumb IP (0-80) 6  Thumb Radial abd/add (0-55)    Thumb Palmar abd/add (0-45) 44   (Blank rows = not tested)   UPPER EXTREMITY MMT:    Eval:  NT at eval due to recent and still healing sx. Will be tested when appropriate.   MMT Left TBD  Wrist flexion   Wrist extension   Wrist ulnar deviation   Wrist radial deviation   (Blank rows = not tested)  HAND FUNCTION: Eval: Observed weakness in affected Lt hand. NT for safety today Grip strength Right: TBD lbs, Left: TBD lbs   COORDINATION: Eval: Observed coordination impairments with affected Lt hand. Details TBD.  9 Hole Peg Test Left: TBD sec   SENSATION: Eval:  Light touch intact today, though  diminished around sx area    EDEMA:   Eval:  Mildly swollen in thumb and radial wrist today  OBSERVATIONS:   Eval: steristrips in place, only mild swelling, no redness, still with surgical iodine on arm, not overly tender to palpation but is somewhat hypersensitive around scar, which is also mildly adhered looking   TODAY'S TREATMENT:  Post-evaluation treatment:  Custom orthotic fabrication was indicated due to pt's healing Lt thumb CMC arthroplasty and need for safe, functional positioning. OT fabricated custom hand-based thumb spica orthotic for pt today to immobilize CMC J and stabilize thumb (IP J free). It fit well with no areas of pressure, pt states a comfortable fit. Pt was educated on the wearing schedule, to call or come in ASAP if it is causing any irritation or is not achieving desired function. It will be checked/adjusted in upcoming  sessions, as needed. Pt states understanding.  She was edu to switch to her pre-fab wrist-thumb orthotic at night to support wrist and thumb in the night, she states understanding.   OT also advises no lifting or functional use out of brace for now, able o shower/wash arm, but not soak arm yet. OT gives out initial HEP as below, which she stated understanding, demo's back with minimal pain today:   Exercises/Activities - Bend and Pull Back Wrist SLOWLY  - 4 x daily - 10-15 reps - "Windshield Wipers"   - 4 x daily - 10-15 reps - Touch Thumb to EACH Finger  (FMS) - 4-6 x daily - 10 reps -Thumb IP J Blocking AROM 4x day, 15x  - Tendon Glides  - 4-6 x daily - 3-5 reps - 2-3 seconds hold - Seated Composite Thumb Flexion AROM  - 4-6 x daily - 1 sets - 10-15 reps - Thumb AROM Palmar Abduction to Radial Abduction  - 4-6 x daily - 10-15 reps Patient Education - Scar Massage 4 x day 2 mins    PATIENT EDUCATION: Education details: See tx section above for details  Person educated: Patient Education method: Verbal Instruction, Teach back, Handouts   Education comprehension: States and demonstrates understanding, Additional Education required    HOME EXERCISE PROGRAM: Access Code: [email or web address] URL: [email or web address] Date: 11/17/2021 Prepared by: Benito Mccreedy   GOALS: Goals reviewed with patient? Yes   SHORT TERM GOALS: (STG required if POC>30 days) Target Date: 11/26/21  Pt will obtain protective, custom orthotic. Goal status: MET   2.  Pt will demo/state understanding of initial HEP to improve pain levels and prerequisite motion. Goal status: INITIAL   LONG TERM GOALS: Target Date: 12/31/21  Pt will improve functional ability by decreased impairment per PSFS assessment from 1.6 to 7.5 or better, for better quality of life. Goal status: INITIAL  2.  Pt will improve grip strength in Lt hand to at least 40lbs for functional use at home and in IADLs. Goal status: INITIAL  3.  Pt will improve A/ROM in Lt thumb MCP and IP J flexion from 15/6* respectively, to at least 50/60* to have functional motion for tasks like grasp, pinch.  Goal status: INITIAL  4.  Pt will improve strength in Lt thumb abd/adduction to at least 4/5 MMT with no pain to have increased functional ability to carry out selfcare and higher-level homecare tasks with no difficulty. Goal status: INITIAL  5.  Pt will improve coordination skills in Lt hand, as seen by better score on 9 Hole Peg Test testing (TBD next session) to have increased functional ability to carry out fine motor tasks (fasteners, etc.) and more complex, coordinated IADLs (meal prep, sports, etc.).  Goal status: INITIAL- TBD baseline next session    ASSESSMENT:  CLINICAL IMPRESSION: Patient is a 66 y.o. female who was seen today for occupational therapy evaluation for Lt thumb pain, recent sx, and decreased ability. She will benefit from OP OT to increase function and quality of life.   PERFORMANCE DEFICITS in functional skills including ADLs, IADLs, coordination,  dexterity, sensation, edema, ROM, strength, pain, fascial restrictions, flexibility, FMC, body mechanics, decreased knowledge of precautions, wound, and UE functional use, cognitive skills including safety awareness, and psychosocial skills including coping strategies, environmental adaptation, and routines and behaviors.   IMPAIRMENTS are limiting patient from ADLs, IADLs, work, leisure, and social participation.   COMORBIDITIES has co-morbidities such as asthma, DM II, HTN, sleep apnea, thyroid disease, and possibly others  that affects occupational performance. Patient will benefit from skilled OT to address above impairments and improve overall function.  MODIFICATION OR ASSISTANCE TO COMPLETE EVALUATION: No modification of tasks or assist necessary to complete an evaluation.  OT OCCUPATIONAL PROFILE AND HISTORY: Problem focused assessment: Including review of records relating to presenting problem.  CLINICAL DECISION MAKING: Moderate - several treatment options, min-mod task modification necessary  REHAB POTENTIAL: Excellent  EVALUATION COMPLEXITY: Low      PLAN: OT FREQUENCY: 2x/week  OT DURATION: 6 weeks (through  12/31/21 as needed)  PLANNED INTERVENTIONS: self care/ADL training, therapeutic exercise, therapeutic activity, neuromuscular re-education, manual therapy, scar mobilization, passive range of motion, splinting, electrical stimulation, ultrasound, fluidotherapy, compression bandaging, moist heat, cryotherapy, contrast bath, patient/family education, and coping strategies training  RECOMMENDED OTHER SERVICES: none now   CONSULTED AND AGREED WITH PLAN OF CARE: Patient  PLAN FOR NEXT SESSION: Check orthotic and initial HEP, upgrade to PROM at thumb as tolerated.    Benito Mccreedy, OTR/L, CHT 11/17/2021, 12:36 PM

## 2021-11-17 NOTE — Progress Notes (Signed)
Post-Op Visit Note   Patient: Dominique Brock           Date of Birth: July 31, 1955           MRN: [id number] Visit Date: 11/17/2021 PCP: Lorrene Reid, PA-C   Assessment & Plan:  Chief Complaint:  Chief Complaint  Patient presents with   Left Thumb - Routine Post Op   Visit Diagnoses:  1. Primary osteoarthritis of first carpometacarpal joint of left hand     Plan: Patient is a pleasant 66 year old female comes in today 4 weeks status post right thumb CMC arthroplasty 10/21/2021.  She is doing well.  Minimal pain which is relieved with over-the-counter NSAIDs.  She has been compliant wearing her thumb spica cast.  Examination of her left thumb after removal of the cast shows a fully healed surgical scar without complication.  She still has residual swelling which is mild.  Fingers are warm and well-perfused.  She is neurovascularly intact distally.  At this point, we will provide her with a removable thumb spica splint.  She has already been referred to hand therapy where she will be fitted for a thermoplastic splint.  Follow-up with [place] in 6 weeks for repeat evaluation.  Call with concerns or questions in the meantime.  Follow-Up Instructions: Return in about 6 weeks (around 12/29/2021).   Orders:  No orders of the defined types were placed in this encounter.  No orders of the defined types were placed in this encounter.   Imaging: No new imaging  PMFS History: Patient Active Problem List   Diagnosis Date Noted   Acne 09/30/2020   Body mass index (BMI) 23.0-23.9, adult 09/30/2020   Chronic fatigue syndrome 09/30/2020   History of weight gain 09/30/2020   Hot flashes 09/30/2020   Low libido 09/30/2020   Female climacteric state 09/30/2020   Menopause 09/30/2020   Primary insomnia 09/30/2020   Pure hypercholesterolemia 09/30/2020   Vaginal dryness 09/30/2020   Diabetes mellitus ([place]) 03/01/2019   Mixed diabetic hyperlipidemia associated with type 2 diabetes mellitus ([place])  03/01/2019   Vitamin D deficiency 03/01/2019   Lipoma of left axilla 03/13/2018   Hypertension associated with diabetes ([place]) 01/15/2018   Gastroesophageal reflux disease 01/15/2018   Perennial allergic rhinitis 09/19/2016   Rhinitis medicamentosa 09/19/2016   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   Chronic sinusitis 09/19/2016   Environmental and seasonal allergies 06/15/2016   Anemia 06/15/2016   Eczema 06/15/2016   Hypothyroidism 02/05/2016   EPISTAXIS, RECURRENT 03/24/2009   BRONCHITIS, ACUTE 10/31/2008   Sinusitis, chronic 02/29/2008   ABDOMINAL PAIN, RIGHT UPPER QUADRANT 06/29/2007   Lipoma 06/12/2007   SYNCOPE, VASOVAGAL 06/12/2007   Moderate persistent asthma 02/17/2007   Obstructive sleep apnea, hx of 02/17/2007   Past Medical History:  Diagnosis Date   Asthma    Diabetes mellitus    prediabetic diet controlled   Eczema 06/15/2016   Hypertension    Hypothyroidism    Sleep apnea    only with allergies   Thyroid disease     Family History  Problem Relation Age of Onset   Hypertension Mother    Hyperlipidemia Mother    Liver cancer Mother 95       died in 05/23/11, at age 69 or 10.   Allergies Mother    Diabetes Mother 14       onset "mid 64."   Healthy Father    Dementia Father 56       not officialy diagnosed  Healthy Other        2 siblings   Asthma Maternal Grandmother    Diabetes Maternal Grandmother    Asthma Cousin    Allergic rhinitis Neg Hx    Angioedema Neg Hx    Eczema Neg Hx    Immunodeficiency Neg Hx    Urticaria Neg Hx     Past Surgical History:  Procedure Laterality Date   ABDOMINAL HYSTERECTOMY     BILATERAL OOPHORECTOMY     EXCISION MASS UPPER EXTREMETIES Left 04/18/2019   Procedure: Excision of left arm mass / lipoma;  Surgeon: Wallace Going, DO;  Location: [place];  Service: Plastics;  Laterality: Left;   SINUS EXPLORATION  1996   Social History   Occupational History   Not on file  Tobacco  Use   Smoking status: Never   Smokeless tobacco: Never  Vaping Use   Vaping Use: Never used  Substance and Sexual Activity   Alcohol use: Yes    Comment: less than once a month   Drug use: No   Sexual activity: Yes

## 2021-11-18 ENCOUNTER — Encounter: Payer: Managed Care, Other (non HMO) | Admitting: Orthopaedic Surgery

## 2021-11-23 ENCOUNTER — Encounter: Payer: Managed Care, Other (non HMO) | Admitting: Rehabilitative and Restorative Service Providers"

## 2021-11-23 ENCOUNTER — Encounter: Payer: Self-pay | Admitting: Rehabilitative and Restorative Service Providers"

## 2021-11-23 ENCOUNTER — Ambulatory Visit: Payer: Managed Care, Other (non HMO) | Admitting: Rehabilitative and Restorative Service Providers"

## 2021-11-23 DIAGNOSIS — R278 Other lack of coordination: Secondary | ICD-10-CM | POA: Diagnosis not present

## 2021-11-23 DIAGNOSIS — M6281 Muscle weakness (generalized): Secondary | ICD-10-CM

## 2021-11-23 DIAGNOSIS — M25642 Stiffness of left hand, not elsewhere classified: Secondary | ICD-10-CM

## 2021-11-23 DIAGNOSIS — R600 Localized edema: Secondary | ICD-10-CM

## 2021-11-23 DIAGNOSIS — M25542 Pain in joints of left hand: Secondary | ICD-10-CM | POA: Diagnosis not present

## 2021-11-23 NOTE — Therapy (Signed)
OUTPATIENT OCCUPATIONAL THERAPY TREATMENT NOTE  Patient Name: Dominique Brock MRN: [id number] DOB:06/01/1955, 66 y.o., female Today's Date: 11/23/2021  PCP: Lorrene Reid, PA-C REFERRING PROVIDER: Leandrew Koyanagi, MD   OT End of Session - 11/23/21 1435     Visit Number 2    Date for OT Re-Evaluation 12/31/21    Authorization Type [employer] $40 copay    OT Start Time 1441    OT Stop Time 1516    OT Time Calculation (min) 35 min    Equipment Utilized During Treatment --    Activity Tolerance Patient tolerated treatment well;No increased pain;Patient limited by fatigue;Patient limited by pain    Behavior During Therapy [facility] for tasks assessed/performed              Past Medical History:  Diagnosis Date   Asthma    Diabetes mellitus    prediabetic diet controlled   Eczema 06/15/2016   Hypertension    Hypothyroidism    Sleep apnea    only with allergies   Thyroid disease    Past Surgical History:  Procedure Laterality Date   ABDOMINAL HYSTERECTOMY     BILATERAL OOPHORECTOMY     EXCISION MASS UPPER EXTREMETIES Left 04/18/2019   Procedure: Excision of left arm mass / lipoma;  Surgeon: Wallace Going, DO;  Location: [place];  Service: Plastics;  Laterality: Left;   SINUS EXPLORATION  1996   Patient Active Problem List   Diagnosis Date Noted   Acne 09/30/2020   Body mass index (BMI) 23.0-23.9, adult 09/30/2020   Chronic fatigue syndrome 09/30/2020   History of weight gain 09/30/2020   Hot flashes 09/30/2020   Low libido 09/30/2020   Female climacteric state 09/30/2020   Menopause 09/30/2020   Primary insomnia 09/30/2020   Pure hypercholesterolemia 09/30/2020   Vaginal dryness 09/30/2020   Diabetes mellitus ([place]) 03/01/2019   Mixed diabetic hyperlipidemia associated with type 2 diabetes mellitus ([place]) 03/01/2019   Vitamin D deficiency 03/01/2019   Lipoma of left axilla 03/13/2018   Hypertension associated with diabetes ([place]) 01/15/2018    Gastroesophageal reflux disease 01/15/2018   Perennial allergic rhinitis 09/19/2016   Rhinitis medicamentosa 09/19/2016   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   Chronic sinusitis 09/19/2016   Environmental and seasonal allergies 06/15/2016   Anemia 06/15/2016   Eczema 06/15/2016   Hypothyroidism 02/05/2016   EPISTAXIS, RECURRENT 03/24/2009   BRONCHITIS, ACUTE 10/31/2008   Sinusitis, chronic 02/29/2008   ABDOMINAL PAIN, RIGHT UPPER QUADRANT 06/29/2007   Lipoma 06/12/2007   SYNCOPE, VASOVAGAL 06/12/2007   Moderate persistent asthma 02/17/2007   Obstructive sleep apnea, hx of 02/17/2007    ONSET DATE: DOS: 10/21/21  REFERRING DIAG: M18.11 (ICD-10-CM) - Primary osteoarthritis of first carpometacarpal joint of right hand  THERAPY DIAG:  Localized edema  Muscle weakness (generalized)  Other lack of coordination  Pain in joint of left hand  Stiffness of left hand, not elsewhere classified  Rationale for Evaluation and Treatment Rehabilitation  PERTINENT HISTORY: Per MD "Eval and treat s/p left thumb CMC arthroplasty.  Hand rehab and thermoplastic hand based splint to begin 10/5 or 10/6."   PRECAUTIONS: Just under 5 weeks post-op now, follow post-op protocols: A/ROM to thumb and P/AROM to wrist as tolerated, @ 5 weeks PROM to thumb, _0  weeks light isometrics & weaning from orthotic, PRE @ 8 weeks, caution until 10-12 weeks    WEIGHT BEARING RESTRICTIONS Yes NWB Lt thumb now  SUBJECTIVE:   SUBJECTIVE STATEMENT: She states  the exercises initially hurt but are more tolerable after a few days, unless done too frequently.    PAIN:  Are you having pain? None Rating: 0/10 at rest now, up to 1/10 in past week when typing or doing things around the house   PATIENT GOALS: Get left hand better for life activities    OBJECTIVE: (All objective assessments below are from initial evaluation on: 11/17/21 unless otherwise specified.)    HAND DOMINANCE: Right    ADLs: Overall ADLs: States decreased ability to grab, hold household objects, pain and inability to open containers, perform FMS tasks (manipulate fasteners on clothing), mild to moderate bathing problems as well.    FUNCTIONAL OUTCOME MEASURES: Eval: Patient Specific Functional Scale: 1.6 (sew, typing, opening jar)  (Higher Score  =  Better Ability for the Selected Tasks)      UPPER EXTREMITY ROM     Shoulder to Wrist AROM Left eval Lt  11/23/21  Forearm supination Approx 90   Forearm pronation  Approx 90   Wrist flexion 27 50  Wrist extension 50 63  Wrist ulnar deviation 26 34  Wrist radial deviation 4 12  Functional dart thrower's motion (F-DTM) in ulnar flexion    F-DTM in radial extension     (Blank rows = not tested)   Hand AROM Left eval Lt  11/23/21  Full Fist Ability (or Gap to Distal Palmar Crease) Loose full fist (lack of MCP J motion)    Thumb Opposition to Small Finger (or Gap) 3.8cm FULL  Thumb Opposition to Base of Small Finger (or Gap)  7cm 3.5cm  Thumb MCP (0-60) 15 28  Thumb IP (0-80) 6 21  Thumb Radial abd/add (0-55)     Thumb Palmar abd/add (0-45) 44  60  (Blank rows = not tested)   UPPER EXTREMITY MMT:    Eval:  NT at eval due to recent and still healing sx. Will be tested when appropriate.   MMT Left TBD  Wrist flexion   Wrist extension   Wrist ulnar deviation   Wrist radial deviation   (Blank rows = not tested)  HAND FUNCTION: Eval: Observed weakness in affected Lt hand. NT for safety today Grip strength Right: TBD lbs, Left: TBD lbs   COORDINATION: TBD: 9 Hole Per Test Lt: TBD  Eval: Observed coordination impairments with affected Lt hand. Details TBD.  9 Hole Peg Test Left: TBD sec   SENSATION: Eval:  Light touch intact today, though diminished around sx area    EDEMA:   Eval:  Mildly swollen in thumb and radial wrist today  OBSERVATIONS:  Eval: steristrips in place, only mild swelling, no redness, still with surgical  iodine on arm, not overly tender to palpation but is somewhat hypersensitive around scar, which is also mildly adhered looking   TODAY'S TREATMENT:  11/23/21: She reviews HEP and performs AROM for new measures, shows great progress.  She tolerates all well, needs no significant  verbal cues for known HEP, but she was also edu on upgrades that include PROM and light stretches now ~5 weeks post op. She tolerates these well with no pain, feeling light to moderate stretches at wrist and thumb for exercises bolded as below.   Exercises - Bend and Pull Back Wrist SLOWLY  - 4 x daily - 10-15 reps - Wrist Flexion Stretch  - 4 x daily - 3-5 reps - 15 sec hold - Wrist Extension Stretch Pronated  - 4 x daily - 3-5 reps - 15 hold - "  Windshield Wipers"   - 2-3 x daily - 10 reps - Touch Thumb to [facility] Finger  - 4-6 x daily - 10 reps - Tendon Glides  - 4-6 x daily - 3-5 reps - 2-3 seconds hold - Seated Composite Thumb Flexion AROM  - 4-6 x daily - 1 sets - 10-15 reps - Thumb AROM Palmar Abduction to Radial Abduction  - 4-6 x daily - 10-15 reps - Stretch Thumb Down (DON'T do like the picture)   - 2-3 x daily - 3-5 reps - 15 sec hold - Stretch thumb in "C" shape  - 3-5 reps - 15 sec hold Patient Education - Scar Massage   PATIENT EDUCATION: Education details: See tx section above for details  Person educated: Patient Education method: Verbal Instruction, Teach back, Handouts  Education comprehension: States and demonstrates understanding, Additional Education required    HOME EXERCISE PROGRAM: Access Code: [email or web address] URL: [email or web address]   GOALS: Goals reviewed with patient? Yes   SHORT TERM GOALS: (STG required if POC>30 days) Target Date: 11/26/21  Pt will obtain protective, custom orthotic. Goal status: MET   2.  Pt will demo/state understanding of initial HEP to improve pain levels and prerequisite motion. Goal status:11/23/21: MET   LONG TERM GOALS: Target Date:  12/31/21  Pt will improve functional ability by decreased impairment per PSFS assessment from 1.6 to 7.5 or better, for better quality of life. Goal status: INITIAL  2.  Pt will improve grip strength in Lt hand to at least 40lbs for functional use at home and in IADLs. Goal status: INITIAL  3.  Pt will improve A/ROM in Lt thumb MCP and IP J flexion from 15/6* respectively, to at least 50/60* to have functional motion for tasks like grasp, pinch.  Goal status: INITIAL  4.  Pt will improve strength in Lt thumb abd/adduction to at least 4/5 MMT with no pain to have increased functional ability to carry out selfcare and higher-level homecare tasks with no difficulty. Goal status: INITIAL  5.  Pt will improve coordination skills in Lt hand, as seen by better score on 9 Hole Peg Test testing (TBD) to have increased functional ability to carry out fine motor tasks (fasteners, etc.) and more complex, coordinated IADLs (meal prep, sports, etc.).  Goal status: INITIAL TBD    ASSESSMENT:  CLINICAL IMPRESSION: 11/23/21: She is much improved and is now tolerating upgrades to stretching, still advised no grip strengthening yet or resistive activities. Steri strips removed and scar healing up though surface still mildly gapped, and wound was dressed, she was told to keep clean/dry or light slick of Vaseline and cover with band-aid.      PLAN: OT FREQUENCY: 2x/week  OT DURATION: 6 weeks (through  12/31/21 as needed)  PLANNED INTERVENTIONS: self care/ADL training, therapeutic exercise, therapeutic activity, neuromuscular re-education, manual therapy, scar mobilization, passive range of motion, splinting, electrical stimulation, ultrasound, fluidotherapy, compression bandaging, moist heat, cryotherapy, contrast bath, patient/family education, and coping strategies training  RECOMMENDED OTHER SERVICES: none now   CONSULTED AND AGREED WITH PLAN OF CARE: Patient  PLAN FOR NEXT SESSION:  Check  motion, upgrade to 6 weeks postop HEP. Do 9HPT    Ligia Duguay Laurance Flatten, OTR/L, CHT 11/23/2021, 3:29 PM

## 2021-12-01 ENCOUNTER — Other Ambulatory Visit: Payer: Self-pay | Admitting: Physician Assistant

## 2021-12-01 DIAGNOSIS — E1169 Type 2 diabetes mellitus with other specified complication: Secondary | ICD-10-CM

## 2021-12-01 DIAGNOSIS — J3089 Other allergic rhinitis: Secondary | ICD-10-CM

## 2021-12-01 NOTE — Therapy (Signed)
OUTPATIENT OCCUPATIONAL THERAPY TREATMENT NOTE  Patient Name: Dominique Brock MRN: [id number] DOB:Sep 07, 1955, 66 y.o., female Today's Date: 12/02/2021  PCP: Lorrene Reid, PA-C REFERRING PROVIDER: Leandrew Koyanagi, MD   OT End of Session - 12/02/21 1748     Visit Number 2    Number of Visits 10    Date for OT Re-Evaluation 12/31/21    Authorization Type [employer] $40 copay    OT Start Time 1508    OT Stop Time 1515    OT Time Calculation (min) 7 min    Activity Tolerance Patient tolerated treatment well;No increased pain;Patient limited by fatigue;Patient limited by pain    Behavior During Therapy [facility] for tasks assessed/performed               Past Medical History:  Diagnosis Date   Asthma    Diabetes mellitus    prediabetic diet controlled   Eczema 06/15/2016   Hypertension    Hypothyroidism    Sleep apnea    only with allergies   Thyroid disease    Past Surgical History:  Procedure Laterality Date   ABDOMINAL HYSTERECTOMY     BILATERAL OOPHORECTOMY     EXCISION MASS UPPER EXTREMETIES Left 04/18/2019   Procedure: Excision of left arm mass / lipoma;  Surgeon: Wallace Going, DO;  Location: [place];  Service: Plastics;  Laterality: Left;   SINUS EXPLORATION  1996   Patient Active Problem List   Diagnosis Date Noted   Acne 09/30/2020   Body mass index (BMI) 23.0-23.9, adult 09/30/2020   Chronic fatigue syndrome 09/30/2020   History of weight gain 09/30/2020   Hot flashes 09/30/2020   Low libido 09/30/2020   Female climacteric state 09/30/2020   Menopause 09/30/2020   Primary insomnia 09/30/2020   Pure hypercholesterolemia 09/30/2020   Vaginal dryness 09/30/2020   Diabetes mellitus ([place]) 03/01/2019   Mixed diabetic hyperlipidemia associated with type 2 diabetes mellitus ([place]) 03/01/2019   Vitamin D deficiency 03/01/2019   Lipoma of left axilla 03/13/2018   Hypertension associated with diabetes ([place]) 01/15/2018   Gastroesophageal reflux  disease 01/15/2018   Perennial allergic rhinitis 09/19/2016   Rhinitis medicamentosa 09/19/2016   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   Chronic sinusitis 09/19/2016   Environmental and seasonal allergies 06/15/2016   Anemia 06/15/2016   Eczema 06/15/2016   Hypothyroidism 02/05/2016   EPISTAXIS, RECURRENT 03/24/2009   BRONCHITIS, ACUTE 10/31/2008   Sinusitis, chronic 02/29/2008   ABDOMINAL PAIN, RIGHT UPPER QUADRANT 06/29/2007   Lipoma 06/12/2007   SYNCOPE, VASOVAGAL 06/12/2007   Moderate persistent asthma 02/17/2007   Obstructive sleep apnea, hx of 02/17/2007    ONSET DATE: DOS: 10/21/21  REFERRING DIAG: M18.11 (ICD-10-CM) - Primary osteoarthritis of first carpometacarpal joint of right hand  THERAPY DIAG:  Localized edema  Muscle weakness (generalized)  Other lack of coordination  Stiffness of left hand, not elsewhere classified  Pain in joint of left hand  Rationale for Evaluation and Treatment Rehabilitation  PERTINENT HISTORY: Per MD "Eval and treat s/p left thumb CMC arthroplasty.  Hand rehab and thermoplastic hand based splint to begin 10/5 or 10/6."   PRECAUTIONS: ~6 weeks post-op now, follow post-op protocols: A/ROM to thumb and P/AROM to wrist as tolerated, @ 5 weeks PROM to thumb, '@6'  weeks light isometrics & weaning from orthotic, PRE @ 8 weeks, caution until 10-12 weeks    WEIGHT BEARING RESTRICTIONS Yes NWB Lt thumb now  SUBJECTIVE:   SUBJECTIVE STATEMENT: She was waiting in  lobby and due to error at therapy clinic, OT was not aware. She was brought back for consultation, but no visit will be billed today.  She states her scar was bleeding after last session and it had her worried. She also stats not wearing orthotic except at night or when away from the home  PAIN:  Are you having pain? No Rating: 0/10 at rest now, up to 1/10 in past week when typing or doing things around the house   PATIENT GOALS: Get left hand better for life activities     OBJECTIVE: (All objective assessments below are from initial evaluation on: 11/17/21 unless otherwise specified.)    HAND DOMINANCE: Right   ADLs: Overall ADLs: States decreased ability to grab, hold household objects, pain and inability to open containers, perform FMS tasks (manipulate fasteners on clothing), mild to moderate bathing problems as well.    FUNCTIONAL OUTCOME MEASURES: Eval: Patient Specific Functional Scale: 1.6 (sew, typing, opening jar)  (Higher Score  =  Better Ability for the Selected Tasks)      UPPER EXTREMITY ROM     Shoulder to Wrist AROM Left eval Lt  11/23/21  Forearm supination Approx 90   Forearm pronation  Approx 90   Wrist flexion 27 50  Wrist extension 50 63  Wrist ulnar deviation 26 34  Wrist radial deviation 4 12  Functional dart thrower's motion (F-DTM) in ulnar flexion    F-DTM in radial extension     (Blank rows = not tested)   Hand AROM Left eval Lt  11/23/21  Full Fist Ability (or Gap to Distal Palmar Crease) Loose full fist (lack of MCP J motion)    Thumb Opposition to Small Finger (or Gap) 3.8cm FULL  Thumb Opposition to Base of Small Finger (or Gap)  7cm 3.5cm  Thumb MCP (0-60) 15 28  Thumb IP (0-80) 6 21  Thumb Radial abd/add (0-55)     Thumb Palmar abd/add (0-45) 44  60  (Blank rows = not tested)   UPPER EXTREMITY MMT:    Eval:  NT at eval due to recent and still healing sx. Will be tested when appropriate.   MMT Left TBD  Wrist flexion   Wrist extension   Wrist ulnar deviation   Wrist radial deviation   (Blank rows = not tested)  HAND FUNCTION: Eval: Observed weakness in affected Lt hand. NT for safety today Grip strength Right: TBD lbs, Left: TBD lbs   COORDINATION: TBD: 9 Hole Per Test Lt: TBDsec  Eval: Observed coordination impairments with affected Lt hand. Details TBD.    SENSATION: Eval:  Light touch intact today, though diminished around sx area    EDEMA:   Eval:  Mildly swollen in thumb and  radial wrist today  OBSERVATIONS:  Eval: steristrips in place, only mild swelling, no redness, still with surgical iodine on arm, not overly tender to palpation but is somewhat hypersensitive around scar, which is also mildly adhered looking   TODAY'S TREATMENT:  12/02/21: OT discusses wearing orthotic more (it was never advised to leave off all the time).  OT discusses sx scar which is closed and not bleeding today. 0.5x0.5 mm area of scab is present on scar line.  OT advises to keep stretching and start gently doing thumb isometric strength in 4 planes of motion as tolerated. She states understanding. No gripping or weight bearing yet.    11/23/21: She reviews HEP and performs AROM for new measures, shows great progress.  She tolerates  all well, needs no significant  verbal cues for known HEP, but she was also edu on upgrades that include PROM and light stretches now ~5 weeks post op. She tolerates these well with no pain, feeling light to moderate stretches at wrist and thumb for exercises bolded as below.   Exercises - Bend and Pull Back Wrist SLOWLY  - 4 x daily - 10-15 reps - Wrist Flexion Stretch  - 4 x daily - 3-5 reps - 15 sec hold - Wrist Extension Stretch Pronated  - 4 x daily - 3-5 reps - 15 hold - "Windshield Wipers"   - 2-3 x daily - 10 reps - Touch Thumb to [facility] Finger  - 4-6 x daily - 10 reps - Tendon Glides  - 4-6 x daily - 3-5 reps - 2-3 seconds hold - Seated Composite Thumb Flexion AROM  - 4-6 x daily - 1 sets - 10-15 reps - Thumb AROM Palmar Abduction to Radial Abduction  - 4-6 x daily - 10-15 reps - Stretch Thumb Down (DON'T do like the picture)   - 2-3 x daily - 3-5 reps - 15 sec hold - Stretch thumb in "C" shape  - 3-5 reps - 15 sec hold Patient Education - Scar Massage   PATIENT EDUCATION: Education details: See tx section above for details  Person educated: Patient Education method: Verbal Instruction, Teach back, Handouts  Education comprehension: States and  demonstrates understanding, Additional Education required    HOME EXERCISE PROGRAM: Access Code: [email or web address] URL: [email or web address]   GOALS: Goals reviewed with patient? Yes   SHORT TERM GOALS: (STG required if POC>30 days) Target Date: 11/26/21  Pt will obtain protective, custom orthotic. Goal status: MET   2.  Pt will demo/state understanding of initial HEP to improve pain levels and prerequisite motion. Goal status:11/23/21: MET   LONG TERM GOALS: Target Date: 12/31/21  Pt will improve functional ability by decreased impairment per PSFS assessment from 1.6 to 7.5 or better, for better quality of life. Goal status: INITIAL  2.  Pt will improve grip strength in Lt hand to at least 40lbs for functional use at home and in IADLs. Goal status: INITIAL  3.  Pt will improve A/ROM in Lt thumb MCP and IP J flexion from 15/6* respectively, to at least 50/60* to have functional motion for tasks like grasp, pinch.  Goal status: INITIAL  4.  Pt will improve strength in Lt thumb abd/adduction to at least 4/5 MMT with no pain to have increased functional ability to carry out selfcare and higher-level homecare tasks with no difficulty. Goal status: INITIAL  5.  Pt will improve coordination skills in Lt hand, as seen by better score on 9 Hole Peg Test testing (TBD) to have increased functional ability to carry out fine motor tasks (fasteners, etc.) and more complex, coordinated IADLs (meal prep, sports, etc.).  Goal status: INITIAL TBD    ASSESSMENT:  CLINICAL IMPRESSION: 12/02/21: She couldn't be seen for a full treatment today.   11/23/21: She is much improved and is now tolerating upgrades to stretching, still advised no grip strengthening yet or resistive activities. Steri strips removed and scar healing up though surface still mildly gapped, and wound was dressed, she was told to keep clean/dry or light slick of Vaseline and cover with band-aid.      PLAN: OT  FREQUENCY: 2x/week  OT DURATION: 6 weeks (through  12/31/21 as needed)  PLANNED INTERVENTIONS: self care/ADL training, therapeutic exercise, therapeutic activity, neuromuscular re-education, manual therapy,  scar mobilization, passive range of motion, splinting, electrical stimulation, ultrasound, fluidotherapy, compression bandaging, moist heat, cryotherapy, contrast bath, patient/family education, and coping strategies training  RECOMMENDED OTHER SERVICES: none now   CONSULTED AND AGREED WITH PLAN OF CARE: Patient  PLAN FOR NEXT SESSION:  Check motion, upgrade to 6 weeks postop HEP. Do 9HPT    Chau Savell Laurance Flatten, OTR/L, CHT 12/02/2021, 5:53 PM

## 2021-12-02 ENCOUNTER — Ambulatory Visit: Payer: Managed Care, Other (non HMO) | Admitting: Rehabilitative and Restorative Service Providers"

## 2021-12-02 ENCOUNTER — Encounter: Payer: Self-pay | Admitting: Rehabilitative and Restorative Service Providers"

## 2021-12-02 DIAGNOSIS — M25642 Stiffness of left hand, not elsewhere classified: Secondary | ICD-10-CM

## 2021-12-02 DIAGNOSIS — R600 Localized edema: Secondary | ICD-10-CM

## 2021-12-02 DIAGNOSIS — R278 Other lack of coordination: Secondary | ICD-10-CM

## 2021-12-02 DIAGNOSIS — M6281 Muscle weakness (generalized): Secondary | ICD-10-CM

## 2021-12-02 DIAGNOSIS — M25542 Pain in joints of left hand: Secondary | ICD-10-CM

## 2021-12-08 NOTE — Therapy (Addendum)
OUTPATIENT OCCUPATIONAL THERAPY TREATMENT NOTE  Patient Name: Dominique Brock MRN: [id number] DOB:[birth year], 66 y.o., female Today's Date: 12/09/2021  PCP: Mayer Masker, PA-C REFERRING PROVIDER: Tarry Kos, MD   OT End of Session - 12/09/21 1525     Visit Number 3    Number of Visits 10    Date for OT Re-Evaluation 12/31/21    Authorization Type [employer] $40 copay    OT Start Time 1524    OT Stop Time 1559    OT Time Calculation (min) 35 min    Activity Tolerance Patient tolerated treatment well;No increased pain;Patient limited by fatigue;Patient limited by pain    Behavior During Therapy [facility] for tasks assessed/performed                Past Medical History:  Diagnosis Date   Asthma    Diabetes mellitus    prediabetic diet controlled   Eczema 06/15/2016   Hypertension    Hypothyroidism    Sleep apnea    only with allergies   Thyroid disease    Past Surgical History:  Procedure Laterality Date   ABDOMINAL HYSTERECTOMY     BILATERAL OOPHORECTOMY     EXCISION MASS UPPER EXTREMETIES Left 04/18/2019   Procedure: Excision of left arm mass / lipoma;  Surgeon: Peggye Form, DO;  Location: [facility];  Service: Plastics;  Laterality: Left;   SINUS EXPLORATION  1996   Patient Active Problem List   Diagnosis Date Noted   Acne 09/30/2020   Body mass index (BMI) 23.0-23.9, adult 09/30/2020   Chronic fatigue syndrome 09/30/2020   History of weight gain 09/30/2020   Hot flashes 09/30/2020   Low libido 09/30/2020   Female climacteric state 09/30/2020   Menopause 09/30/2020   Primary insomnia 09/30/2020   Pure hypercholesterolemia 09/30/2020   Vaginal dryness 09/30/2020   Diabetes mellitus (HCC) 03/01/2019   Mixed diabetic hyperlipidemia associated with type 2 diabetes mellitus (HCC) 03/01/2019   Vitamin D deficiency 03/01/2019   Lipoma of left axilla 03/13/2018   Hypertension associated with diabetes (HCC) 01/15/2018   Gastroesophageal reflux  disease 01/15/2018   Perennial allergic rhinitis 09/19/2016   Rhinitis medicamentosa 09/19/2016   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   Chronic sinusitis 09/19/2016   Environmental and seasonal allergies 06/15/2016   Anemia 06/15/2016   Eczema 06/15/2016   Hypothyroidism 02/05/2016   EPISTAXIS, RECURRENT 03/24/2009   BRONCHITIS, ACUTE 10/31/2008   Sinusitis, chronic 02/29/2008   ABDOMINAL PAIN, RIGHT UPPER QUADRANT 06/29/2007   Lipoma 06/12/2007   SYNCOPE, VASOVAGAL 06/12/2007   Moderate persistent asthma 02/17/2007   Obstructive sleep apnea, hx of 02/17/2007    ONSET DATE: DOS: 10/21/21  REFERRING DIAG: M18.11 (ICD-10-CM) - Primary osteoarthritis of first carpometacarpal joint of right hand  THERAPY DIAG:  Localized edema  Muscle weakness (generalized)  Other lack of coordination  Pain in joint of left hand  Stiffness of left hand, not elsewhere classified  Rationale for Evaluation and Treatment Rehabilitation  PERTINENT HISTORY: Per MD "Eval and treat s/p left thumb CMC arthroplasty.  Hand rehab and thermoplastic hand based splint to begin 10/5 or 10/6."   PRECAUTIONS: 7 weeks post-op now, follow post-op protocols: A/ROM to thumb and P/AROM to wrist as tolerated, @ 5 weeks PROM to thumb, @6  weeks light isometrics & weaning from orthotic, PRE @ 8 weeks, caution until 10-12 weeks    WEIGHT BEARING RESTRICTIONS Yes NWB Lt thumb now  SUBJECTIVE:   SUBJECTIVE STATEMENT: She states doing  well, sometimes "forgetting about" her thumb which speaks to better functional ability.    PAIN:  Are you having pain? No  Rating: 0/10 at rest now, up to 2-3/10 in past week when "forgetting" and pushing from hand.    PATIENT GOALS: Get left hand better for life activities    OBJECTIVE: (All objective assessments below are from initial evaluation on: 11/17/21 unless otherwise specified.)    HAND DOMINANCE: Right   ADLs: Overall ADLs: States decreased ability to  grab, hold household objects, pain and inability to open containers, perform FMS tasks (manipulate fasteners on clothing), mild to moderate bathing problems as well.    FUNCTIONAL OUTCOME MEASURES: Eval: Patient Specific Functional Scale: 1.6 (sew, typing, opening jar)  (Higher Score  =  Better Ability for the Selected Tasks)      UPPER EXTREMITY ROM     Shoulder to Wrist AROM Left eval Lt  11/23/21  Forearm supination Approx 90   Forearm pronation  Approx 90   Wrist flexion 27 50  Wrist extension 50 63  Wrist ulnar deviation 26 34  Wrist radial deviation 4 12  Functional dart thrower's motion (F-DTM) in ulnar flexion    F-DTM in radial extension     (Blank rows = not tested)   Hand AROM Left eval Lt  11/23/21  Full Fist Ability (or Gap to Distal Palmar Crease) Loose full fist (lack of MCP J motion)    Thumb Opposition to Small Finger (or Gap) 3.8cm FULL  Thumb Opposition to Base of Small Finger (or Gap)  7cm 3.5cm  Thumb MCP (0-60) 15 28  Thumb IP (0-80) 6 21  Thumb Radial abd/add (0-55)     Thumb Palmar abd/add (0-45) 44  60  (Blank rows = not tested)   UPPER EXTREMITY MMT:    Eval:  NT at eval due to recent and still healing sx. Will be tested when appropriate.   MMT Left TBD  Wrist flexion   Wrist extension   Wrist ulnar deviation   Wrist radial deviation   (Blank rows = not tested)  HAND FUNCTION: Eval: Observed weakness in affected Lt hand. NT for safety today Grip strength Right: TBD lbs, Left: TBD lbs   COORDINATION:  Eval: Observed coordination impairments with affected Lt hand. Details TBD.    SENSATION: Eval:  Light touch intact today, though diminished around sx area    EDEMA:   Eval:  Mildly swollen in thumb and radial wrist today  OBSERVATIONS:  Eval: steristrips in place, only mild swelling, no redness, still with surgical iodine on arm, not overly tender to palpation but is somewhat hypersensitive around scar, which is also mildly adhered  looking   TODAY'S TREATMENT:  12/09/21: She starts with review of recommendations from last meeting (isometric HEP, etc.). OT leads her through thumb stretches and blocking AROM, also has her do isometrics as tol followed by starting very light thumb and grip training activities with yellow putty which she tolerates well with cues, but is encouraged to stop if it becomes painful at all.    Exercises - Wrist Flexion Stretch  - 4 x daily - 3-5 reps - 15 sec hold - Wrist Extension Stretch Pronated  - 4 x daily - 3-5 reps - 15 hold - Touch Thumb to [facility] Finger  - 4-6 x daily - 10 reps - Stretch Thumb Down (DON'T do like the picture)   - 2-3 x daily - 3-5 reps - 15 sec hold - Stretch thumb  in "C" shape  - 3-5 reps - 15 sec hold - Full Fist  - 2-3 x daily - 5 reps - Finger Extension "Pizza!"   - 2-3 x daily - 5 reps - Thumb Press  - 2-3 x daily - 5 reps - Thumb Opposition with Putty  - 2-3 x daily - 5 reps - Finger Spread  - 2-3 x daily - 5 reps  Patient Education - Scar Massage   12/02/21: OT discusses wearing orthotic more (it was never advised to leave off all the time).  OT discusses sx scar which is closed and not bleeding today. 0.5x0.5 mm area of scab is present on scar line.  OT advises to keep stretching and start gently doing thumb isometric strength in 4 planes of motion as tolerated. She states understanding. No gripping or weight bearing yet.    PATIENT EDUCATION: Education details: See tx section above for details  Person educated: Patient Education method: Verbal Instruction, Teach back, Handouts  Education comprehension: States and demonstrates understanding, Additional Education required    HOME EXERCISE PROGRAM: Access Code: [email or web address] URL: [email or web address]   GOALS: Goals reviewed with patient? Yes   SHORT TERM GOALS: (STG required if POC>30 days) Target Date: 11/26/21  Pt will obtain protective, custom orthotic. Goal status: MET   2.  Pt  will demo/state understanding of initial HEP to improve pain levels and prerequisite motion. Goal status:11/23/21: MET   LONG TERM GOALS: Target Date: 12/31/21  Pt will improve functional ability by decreased impairment per PSFS assessment from 1.6 to 7.5 or better, for better quality of life. Goal status: INITIAL  2.  Pt will improve grip strength in Lt hand to at least 40lbs for functional use at home and in IADLs. Goal status: INITIAL  3.  Pt will improve A/ROM in Lt thumb MCP and IP J flexion from 15/6* respectively, to at least 50/60* to have functional motion for tasks like grasp, pinch.  Goal status: INITIAL  4.  Pt will improve strength in Lt thumb abd/adduction to at least 4/5 MMT with no pain to have increased functional ability to carry out selfcare and higher-level homecare tasks with no difficulty. Goal status: INITIAL  5.  Pt will improve coordination skills in Lt hand, as seen by better score on 9 Hole Peg Test testing (TBD) to have increased functional ability to carry out fine motor tasks (fasteners, etc.) and more complex, coordinated IADLs (meal prep, sports, etc.).  Goal status: INITIAL TBD    ASSESSMENT:  CLINICAL IMPRESSION: 12/09/21: She continues to improve motion and ability.   12/02/21: She couldn't be seen for a full treatment today.   11/23/21: She is much improved and is now tolerating upgrades to stretching, still advised no grip strengthening yet or resistive activities. Steri strips removed and scar healing up though surface still mildly gapped, and wound was dressed, she was told to keep clean/dry or light slick of Vaseline and cover with band-aid.      PLAN: OT FREQUENCY: 2x/week  OT DURATION: 6 weeks (through  12/31/21 as needed)  PLANNED INTERVENTIONS: self care/ADL training, therapeutic exercise, therapeutic activity, neuromuscular re-education, manual therapy, scar mobilization, passive range of motion, splinting, electrical stimulation,  ultrasound, fluidotherapy, compression bandaging, moist heat, cryotherapy, contrast bath, patient/family education, and coping strategies training  RECOMMENDED OTHER SERVICES: none now   CONSULTED AND AGREED WITH PLAN OF CARE: Patient  PLAN FOR NEXT SESSION:  Upgrade to wrist strength as tolerated next session.    Fannie Knee,  OTR/L, CHT 12/09/2021, 4:03 PM

## 2021-12-09 ENCOUNTER — Ambulatory Visit: Payer: Managed Care, Other (non HMO) | Admitting: Rehabilitative and Restorative Service Providers"

## 2021-12-09 ENCOUNTER — Encounter: Payer: Self-pay | Admitting: Rehabilitative and Restorative Service Providers"

## 2021-12-09 DIAGNOSIS — R600 Localized edema: Secondary | ICD-10-CM

## 2021-12-09 DIAGNOSIS — M25642 Stiffness of left hand, not elsewhere classified: Secondary | ICD-10-CM

## 2021-12-09 DIAGNOSIS — M25542 Pain in joints of left hand: Secondary | ICD-10-CM | POA: Diagnosis not present

## 2021-12-09 DIAGNOSIS — M6281 Muscle weakness (generalized): Secondary | ICD-10-CM

## 2021-12-09 DIAGNOSIS — R278 Other lack of coordination: Secondary | ICD-10-CM

## 2021-12-15 NOTE — Therapy (Signed)
OUTPATIENT OCCUPATIONAL THERAPY TREATMENT NOTE  Patient Name: Dominique Brock MRN: [id number] DOB:[birth year], 66 y.o., female Today's Date: 12/16/2021  PCP: Lorrene Reid, PA-C REFERRING PROVIDER: Leandrew Koyanagi, MD   OT End of Session - 12/16/21 1524     Visit Number 4    Number of Visits 10    Date for OT Re-Evaluation 12/31/21    Authorization Type [employer] $40 copay    OT Start Time 1524    OT Stop Time 1600    OT Time Calculation (min) 36 min    Activity Tolerance Patient tolerated treatment well;No increased pain;Patient limited by fatigue;Patient limited by pain    Behavior During Therapy [facility] for tasks assessed/performed                 Past Medical History:  Diagnosis Date   Asthma    Diabetes mellitus    prediabetic diet controlled   Eczema 06/15/2016   Hypertension    Hypothyroidism    Sleep apnea    only with allergies   Thyroid disease    Past Surgical History:  Procedure Laterality Date   ABDOMINAL HYSTERECTOMY     BILATERAL OOPHORECTOMY     EXCISION MASS UPPER EXTREMETIES Left 04/18/2019   Procedure: Excision of left arm mass / lipoma;  Surgeon: Wallace Going, DO;  Location: [place];  Service: Plastics;  Laterality: Left;   SINUS EXPLORATION  1996   Patient Active Problem List   Diagnosis Date Noted   Acne 09/30/2020   Body mass index (BMI) 23.0-23.9, adult 09/30/2020   Chronic fatigue syndrome 09/30/2020   History of weight gain 09/30/2020   Hot flashes 09/30/2020   Low libido 09/30/2020   Female climacteric state 09/30/2020   Menopause 09/30/2020   Primary insomnia 09/30/2020   Pure hypercholesterolemia 09/30/2020   Vaginal dryness 09/30/2020   Diabetes mellitus ([place]) 03/01/2019   Mixed diabetic hyperlipidemia associated with type 2 diabetes mellitus ([place]) 03/01/2019   Vitamin D deficiency 03/01/2019   Lipoma of left axilla 03/13/2018   Hypertension associated with diabetes ([place]) 01/15/2018   Gastroesophageal  reflux disease 01/15/2018   Perennial allergic rhinitis 09/19/2016   Rhinitis medicamentosa 09/19/2016   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   Chronic sinusitis 09/19/2016   Environmental and seasonal allergies 06/15/2016   Anemia 06/15/2016   Eczema 06/15/2016   Hypothyroidism 02/05/2016   EPISTAXIS, RECURRENT 03/24/2009   BRONCHITIS, ACUTE 10/31/2008   Sinusitis, chronic 02/29/2008   ABDOMINAL PAIN, RIGHT UPPER QUADRANT 06/29/2007   Lipoma 06/12/2007   SYNCOPE, VASOVAGAL 06/12/2007   Moderate persistent asthma 02/17/2007   Obstructive sleep apnea, hx of 02/17/2007    ONSET DATE: DOS: 10/21/21  REFERRING DIAG: M18.11 (ICD-10-CM) - Primary osteoarthritis of first carpometacarpal joint of right hand  THERAPY DIAG:  Localized edema  Muscle weakness (generalized)  Other lack of coordination  Stiffness of left hand, not elsewhere classified  Pain in joint of left hand  Rationale for Evaluation and Treatment Rehabilitation  PERTINENT HISTORY: Per MD "Eval and treat s/p left thumb CMC arthroplasty.  Hand rehab and thermoplastic hand based splint to begin 10/5 or 10/6."   PRECAUTIONS: 8 weeks post-op now, follow post-op protocols: A/ROM to thumb and P/AROM to wrist as tolerated, @ 5 weeks PROM to thumb, _0  weeks light isometrics & weaning from orthotic, PRE @ 8 weeks, caution until 10-12 weeks    WEIGHT BEARING RESTRICTIONS Yes NWB Lt thumb now  SUBJECTIVE:   SUBJECTIVE STATEMENT: She states  having increased soreness this week, but also admits not stretching as much or using heat as much, and using hand more, etc.    PAIN:  Are you having pain? Yes Rating: 2/10 at rest now, up to 2-3/10 in past week when "forgetting" and pushing from hand.    PATIENT GOALS: Get left hand better for life activities    OBJECTIVE: (All objective assessments below are from initial evaluation on: 11/17/21 unless otherwise specified.)    HAND DOMINANCE: Right   ADLs: Overall  ADLs: States decreased ability to grab, hold household objects, pain and inability to open containers, perform FMS tasks (manipulate fasteners on clothing), mild to moderate bathing problems as well.    FUNCTIONAL OUTCOME MEASURES: Eval: Patient Specific Functional Scale: 1.6 (sew, typing, opening jar)  (Higher Score  =  Better Ability for the Selected Tasks)      UPPER EXTREMITY ROM     Shoulder to Wrist AROM Left eval Lt  11/23/21  Forearm supination Approx 90   Forearm pronation  Approx 90   Wrist flexion 27 50  Wrist extension 50 63  Wrist ulnar deviation 26 34  Wrist radial deviation 4 12  Functional dart thrower's motion (F-DTM) in ulnar flexion    F-DTM in radial extension     (Blank rows = not tested)   Hand AROM Left eval Lt  11/23/21  Full Fist Ability (or Gap to Distal Palmar Crease) Loose full fist (lack of MCP J motion)    Thumb Opposition to Small Finger (or Gap) 3.8cm FULL  Thumb Opposition to Base of Small Finger (or Gap)  7cm 3.5cm  Thumb MCP (0-60) 15 28  Thumb IP (0-80) 6 21  Thumb Radial abd/add (0-55)     Thumb Palmar abd/add (0-45) 44  60  (Blank rows = not tested)   UPPER EXTREMITY MMT:    Eval:  NT at eval due to recent and still healing sx. Will be tested when appropriate.   MMT Left TBD  Wrist flexion   Wrist extension   Wrist ulnar deviation   Wrist radial deviation   (Blank rows = not tested)  HAND FUNCTION: Eval: Observed weakness in affected Lt hand. NT for safety today Grip strength Right: TBD lbs, Left: TBD lbs   COORDINATION:  Eval: Observed coordination impairments with affected Lt hand. Details TBD.    SENSATION: Eval:  Light touch intact today, though diminished around sx area    EDEMA:   Eval:  Mildly swollen in thumb and radial wrist today  OBSERVATIONS:  Eval: steristrips in place, only mild swelling, no redness, still with surgical iodine on arm, not overly tender to palpation but is somewhat hypersensitive around  scar, which is also mildly adhered looking   TODAY'S TREATMENT:  12/16/21:  Due to increase in pain, OT reviews HEP and POC with recommendations like, don't cause pain, don't lift > 10# for now, rest hand if tired/sore after 1-2 hours of functional activity, stretch at least 3 x day, only do new hand strengthening if non-painful, don't do as intense if painful, etc. She states understanding, but also "jumping back into" her daily routines.  OT uses MH during education, then does manual therapy stretches at thumb and wrist, also MFR over thumb and scar.  OT removes k-tape as it may have been causing some compression (this is k-tape she applied herself). She has no pain after manual techniques.  OT then has her try putty activities again, and with cues and caution  she can pinch and squeeze without pain, but unable to go through a fullness of motion.  OT tells her that's ok and to progress it slowly, as tolerated. She states understanding.    PATIENT EDUCATION: Education details: See tx section above for details  Person educated: Patient Education method: Verbal Instruction, Teach back, Handouts  Education comprehension: States and demonstrates understanding, Additional Education required    HOME EXERCISE PROGRAM: Access Code: [email or web address] URL: [email or web address]   GOALS: Goals reviewed with patient? Yes   SHORT TERM GOALS: (STG required if POC>30 days) Target Date: 11/26/21  Pt will obtain protective, custom orthotic. Goal status: MET   2.  Pt will demo/state understanding of initial HEP to improve pain levels and prerequisite motion. Goal status:11/23/21: MET   LONG TERM GOALS: Target Date: 12/31/21  Pt will improve functional ability by decreased impairment per PSFS assessment from 1.6 to 7.5 or better, for better quality of life. Goal status: INITIAL  2.  Pt will improve grip strength in Lt hand to at least 40lbs for functional use at home and in IADLs. Goal  status: INITIAL  3.  Pt will improve A/ROM in Lt thumb MCP and IP J flexion from 15/6* respectively, to at least 50/60* to have functional motion for tasks like grasp, pinch.  Goal status: INITIAL  4.  Pt will improve strength in Lt thumb abd/adduction to at least 4/5 MMT with no pain to have increased functional ability to carry out selfcare and higher-level homecare tasks with no difficulty. Goal status: INITIAL  5.  Pt will improve coordination skills in Lt hand, as seen by better score on 9 Hole Peg Test testing (TBD) to have increased functional ability to carry out fine motor tasks (fasteners, etc.) and more complex, coordinated IADLs (meal prep, sports, etc.).  Goal status: INITIAL TBD    ASSESSMENT:  CLINICAL IMPRESSION: 12/16/21: She had some pain, but seems to have just not been stretching enough and moving too fast, too heavy, etc at home.  If still sore next week, we can adjust more significantly if needed.     PLAN: OT FREQUENCY: 2x/week  OT DURATION: 6 weeks (through  12/31/21 as needed)  PLANNED INTERVENTIONS: self care/ADL training, therapeutic exercise, therapeutic activity, neuromuscular re-education, manual therapy, scar mobilization, passive range of motion, splinting, electrical stimulation, ultrasound, fluidotherapy, compression bandaging, moist heat, cryotherapy, contrast bath, patient/family education, and coping strategies training  RECOMMENDED OTHER SERVICES: none now   CONSULTED AND AGREED WITH PLAN OF CARE: Patient  PLAN FOR NEXT SESSION:  Check pain levels/tenderness.  Do manual as needed, if doing well add gentle wrist strength.    Benito Mccreedy, OTR/L, CHT 12/16/2021, 4:23 PM

## 2021-12-16 ENCOUNTER — Ambulatory Visit: Payer: Managed Care, Other (non HMO) | Admitting: Rehabilitative and Restorative Service Providers"

## 2021-12-16 ENCOUNTER — Encounter: Payer: Self-pay | Admitting: Rehabilitative and Restorative Service Providers"

## 2021-12-16 DIAGNOSIS — R600 Localized edema: Secondary | ICD-10-CM | POA: Diagnosis not present

## 2021-12-16 DIAGNOSIS — M25542 Pain in joints of left hand: Secondary | ICD-10-CM

## 2021-12-16 DIAGNOSIS — R278 Other lack of coordination: Secondary | ICD-10-CM

## 2021-12-16 DIAGNOSIS — M6281 Muscle weakness (generalized): Secondary | ICD-10-CM

## 2021-12-16 DIAGNOSIS — M25642 Stiffness of left hand, not elsewhere classified: Secondary | ICD-10-CM

## 2021-12-24 ENCOUNTER — Encounter: Payer: Self-pay | Admitting: Rehabilitative and Restorative Service Providers"

## 2021-12-24 ENCOUNTER — Ambulatory Visit: Payer: Managed Care, Other (non HMO) | Admitting: Rehabilitative and Restorative Service Providers"

## 2021-12-24 DIAGNOSIS — M25542 Pain in joints of left hand: Secondary | ICD-10-CM

## 2021-12-24 DIAGNOSIS — M25642 Stiffness of left hand, not elsewhere classified: Secondary | ICD-10-CM | POA: Diagnosis not present

## 2021-12-24 DIAGNOSIS — R278 Other lack of coordination: Secondary | ICD-10-CM | POA: Diagnosis not present

## 2021-12-24 DIAGNOSIS — R600 Localized edema: Secondary | ICD-10-CM | POA: Diagnosis not present

## 2021-12-24 DIAGNOSIS — M6281 Muscle weakness (generalized): Secondary | ICD-10-CM

## 2021-12-24 NOTE — Therapy (Signed)
OUTPATIENT OCCUPATIONAL THERAPY TREATMENT NOTE  Patient Name: VIHANA KYDD MRN: [id number] DOB:Apr 13, 1955, 66 y.o., female Today's Date: 12/24/2021  PCP: Lorrene Reid, PA-C REFERRING PROVIDER: Leandrew Koyanagi, MD   OT End of Session - 12/24/21 1023     Visit Number 5    Number of Visits 10    Date for OT Re-Evaluation 12/31/21    Authorization Type [employer] $40 copay    OT Start Time 1022    OT Stop Time 1101    OT Time Calculation (min) 39 min    Activity Tolerance Patient tolerated treatment well;No increased pain;Patient limited by fatigue    Behavior During Therapy [facility] for tasks assessed/performed              Past Medical History:  Diagnosis Date   Asthma    Diabetes mellitus    prediabetic diet controlled   Eczema 06/15/2016   Hypertension    Hypothyroidism    Sleep apnea    only with allergies   Thyroid disease    Past Surgical History:  Procedure Laterality Date   ABDOMINAL HYSTERECTOMY     BILATERAL OOPHORECTOMY     EXCISION MASS UPPER EXTREMETIES Left 04/18/2019   Procedure: Excision of left arm mass / lipoma;  Surgeon: Wallace Going, DO;  Location: [place];  Service: Plastics;  Laterality: Left;   SINUS EXPLORATION  1996   Patient Active Problem List   Diagnosis Date Noted   Acne 09/30/2020   Body mass index (BMI) 23.0-23.9, adult 09/30/2020   Chronic fatigue syndrome 09/30/2020   History of weight gain 09/30/2020   Hot flashes 09/30/2020   Low libido 09/30/2020   Female climacteric state 09/30/2020   Menopause 09/30/2020   Primary insomnia 09/30/2020   Pure hypercholesterolemia 09/30/2020   Vaginal dryness 09/30/2020   Diabetes mellitus ([place]) 03/01/2019   Mixed diabetic hyperlipidemia associated with type 2 diabetes mellitus ([place]) 03/01/2019   Vitamin D deficiency 03/01/2019   Lipoma of left axilla 03/13/2018   Hypertension associated with diabetes ([place]) 01/15/2018   Gastroesophageal reflux disease 01/15/2018    Perennial allergic rhinitis 09/19/2016   Rhinitis medicamentosa 09/19/2016   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   Chronic sinusitis 09/19/2016   Environmental and seasonal allergies 06/15/2016   Anemia 06/15/2016   Eczema 06/15/2016   Hypothyroidism 02/05/2016   EPISTAXIS, RECURRENT 03/24/2009   BRONCHITIS, ACUTE 10/31/2008   Sinusitis, chronic 02/29/2008   ABDOMINAL PAIN, RIGHT UPPER QUADRANT 06/29/2007   Lipoma 06/12/2007   SYNCOPE, VASOVAGAL 06/12/2007   Moderate persistent asthma 02/17/2007   Obstructive sleep apnea, hx of 02/17/2007    ONSET DATE: DOS: 10/21/21  REFERRING DIAG: M18.11 (ICD-10-CM) - Primary osteoarthritis of first carpometacarpal joint of right hand  THERAPY DIAG:  Localized edema  Muscle weakness (generalized)  Stiffness of left hand, not elsewhere classified  Other lack of coordination  Pain in joint of left hand  Rationale for Evaluation and Treatment Rehabilitation  PERTINENT HISTORY: Per MD "Eval and treat s/p left thumb CMC arthroplasty.  Hand rehab and thermoplastic hand based splint to begin 10/5 or 10/6."   PRECAUTIONS: 9 weeks post-op now, follow post-op protocols: A/ROM to thumb and P/AROM to wrist as tolerated, @ 5 weeks PROM to thumb, _0  weeks light isometrics & weaning from orthotic, PRE @ 8 weeks, caution until 10-12 weeks    WEIGHT BEARING RESTRICTIONS Yes NWB Lt thumb now  SUBJECTIVE:   SUBJECTIVE STATEMENT: She states feeling better since last seen, she  backed off of hand strength and now less tender.    PAIN:  Are you having pain? No Rating: 0/10 at rest now, up to 1-2/10 in past week when "forgetting" and pushing from hand.    PATIENT GOALS: Get left hand better for life activities    OBJECTIVE: (All objective assessments below are from initial evaluation on: 11/17/21 unless otherwise specified.)    HAND DOMINANCE: Right   ADLs: Overall ADLs: States decreased ability to grab, hold household objects, pain  and inability to open containers, perform FMS tasks (manipulate fasteners on clothing), mild to moderate bathing problems as well.    FUNCTIONAL OUTCOME MEASURES: Eval: Patient Specific Functional Scale: 1.6 (sew, typing, opening jar)  (Higher Score  =  Better Ability for the Selected Tasks)      UPPER EXTREMITY ROM     Shoulder to Wrist AROM Left eval Lt  11/23/21  Forearm supination Approx 90   Forearm pronation  Approx 90   Wrist flexion 27 50  Wrist extension 50 63  Wrist ulnar deviation 26 34  Wrist radial deviation 4 12  Functional dart thrower's motion (F-DTM) in ulnar flexion    F-DTM in radial extension     (Blank rows = not tested)   Hand AROM Left eval Lt  11/23/21  Full Fist Ability (or Gap to Distal Palmar Crease) Loose full fist (lack of MCP J motion)    Thumb Opposition to Small Finger (or Gap) 3.8cm FULL  Thumb Opposition to Base of Small Finger (or Gap)  7cm 3.5cm  Thumb MCP (0-60) 15 28  Thumb IP (0-80) 6 21  Thumb Radial abd/add (0-55)     Thumb Palmar abd/add (0-45) 44  60  (Blank rows = not tested)   UPPER EXTREMITY MMT:    Eval:  NT at eval due to recent and still healing sx. Will be tested when appropriate.   MMT Left TBD  Wrist flexion   Wrist extension   Wrist ulnar deviation   Wrist radial deviation   (Blank rows = not tested)  HAND FUNCTION: 12/30/21: Grip strength Right: TBD lbs, Left: TBD lbs    COORDINATION:  12/30/21: 9HPT: LEFT: TBDsec    SENSATION: Eval:  Light touch intact today, though diminished around sx area    EDEMA:   Eval:  Mildly swollen in thumb and radial wrist today  OBSERVATIONS:  Eval: steristrips in place, only mild swelling, no redness, still with surgical iodine on arm, not overly tender to palpation but is somewhat hypersensitive around scar, which is also mildly adhered looking   TODAY'S TREATMENT:  12/24/21: Due to pain being lower now, OT is able to advance her back to strengthening at the wrist  and hand.  OT teaches isometric grip training as well as isometric wrist flexion extension ulnar and radial deviations today.  She does 5 times each with 5-second holds tolerated very well.  She does these after OT does manual therapy stretches for the thumb and wrist.  Everything tolerated very well patient states knowing what to do at home.    Exercises - Standing Radial Nerve Glide  - 4-6 x daily - 1 sets - 10-15 reps - Wrist Flexion Stretch  - 4 x daily - 3-5 reps - 15 sec hold - Wrist Extension Stretch Pronated  - 4 x daily - 3-5 reps - 15 hold - Stretch Thumb Down (DON'T do like the picture)   - 2-3 x daily - 3-5 reps - 15 sec hold -  Stretch thumb in "C" shape  - 3-5 reps - 15 sec hold - Towel Roll Grip with Forearm in Neutral  - 1-3 x daily - 5 reps - 5 sec hold - Seated Isometric Wrist Ulnar Deviation with Manual Resistance  - 1-3 x daily - 1-2 sets - 5 reps - 5 hold    PATIENT EDUCATION: Education details: See tx section above for details  Person educated: Patient Education method: Verbal Instruction, Teach back, Handouts  Education comprehension: States and demonstrates understanding, Additional Education required    HOME EXERCISE PROGRAM: Access Code: [email or web address] URL: [email or web address]   GOALS: Goals reviewed with patient? Yes   SHORT TERM GOALS: (STG required if POC>30 days) Target Date: 11/26/21  Pt will obtain protective, custom orthotic. Goal status: MET   2.  Pt will demo/state understanding of initial HEP to improve pain levels and prerequisite motion. Goal status:11/23/21: MET   LONG TERM GOALS: Target Date: 12/31/21  Pt will improve functional ability by decreased impairment per PSFS assessment from 1.6 to 7.5 or better, for better quality of life. Goal status: INITIAL  2.  Pt will improve grip strength in Lt hand to at least 40lbs for functional use at home and in IADLs. Goal status: INITIAL  3.  Pt will improve A/ROM in Lt thumb MCP  and IP J flexion from 15/6* respectively, to at least 50/60* to have functional motion for tasks like grasp, pinch.  Goal status: INITIAL  4.  Pt will improve strength in Lt thumb abd/adduction to at least 4/5 MMT with no pain to have increased functional ability to carry out selfcare and higher-level homecare tasks with no difficulty. Goal status: INITIAL  5.  Pt will improve coordination skills in Lt hand, as seen by better score on 9 Hole Peg Test testing (TBD) to have increased functional ability to carry out fine motor tasks (fasteners, etc.) and more complex, coordinated IADLs (meal prep, sports, etc.).  Goal status: INITIAL TBD    ASSESSMENT:  CLINICAL IMPRESSION: 12/24/21: She is back on track, little to no pain, tolerating light strength well in hand and wrist and is encouraged to keep this up, progressively.   12/16/21: She had some pain, but seems to have just not been stretching enough and moving too fast, too heavy, etc at home.  If still sore next week, we can adjust more significantly if needed.     PLAN: OT FREQUENCY: 2x/week  OT DURATION: 6 weeks (through  12/31/21 as needed)  PLANNED INTERVENTIONS: self care/ADL training, therapeutic exercise, therapeutic activity, neuromuscular re-education, manual therapy, scar mobilization, passive range of motion, splinting, electrical stimulation, ultrasound, fluidotherapy, compression bandaging, moist heat, cryotherapy, contrast bath, patient/family education, and coping strategies training  RECOMMENDED OTHER SERVICES: none now   CONSULTED AND AGREED WITH PLAN OF CARE: Patient  PLAN FOR NEXT SESSION:  Reassess next week per POC. Check new strength at wrist, continue in hand, build up endurance and tolerance to full activity as needed    Benito Mccreedy, OTR/L, CHT 12/24/2021, 12:57 PM

## 2021-12-29 NOTE — Therapy (Signed)
OUTPATIENT OCCUPATIONAL THERAPY TREATMENT & PROGRESS NOTE  Patient Name: Dominique Brock MRN: [id number] DOB:Dec 28, 1955, 66 y.o., female Today's Date: 12/30/2021  PCP: Lorrene Reid, PA-C REFERRING PROVIDER: Leandrew Koyanagi, MD  Progress Note  Reporting Period 11/17/21 to 12/30/21.  See note below for Objective Data and Assessment of Progress/Goals.        OT End of Session - 12/30/21 1434     Visit Number 6    Number of Visits 10    Date for OT Re-Evaluation 01/28/22    Authorization Type [employer] $40 copay    OT Start Time 1435    OT Stop Time 1516    OT Time Calculation (min) 41 min    Activity Tolerance Patient tolerated treatment well;No increased pain;Patient limited by fatigue;Patient limited by pain    Behavior During Therapy [facility] for tasks assessed/performed            Past Medical History:  Diagnosis Date   Asthma    Diabetes mellitus    prediabetic diet controlled   Eczema 06/15/2016   Hypertension    Hypothyroidism    Sleep apnea    only with allergies   Thyroid disease    Past Surgical History:  Procedure Laterality Date   ABDOMINAL HYSTERECTOMY     BILATERAL OOPHORECTOMY     EXCISION MASS UPPER EXTREMETIES Left 04/18/2019   Procedure: Excision of left arm mass / lipoma;  Surgeon: Wallace Going, DO;  Location: [place];  Service: Plastics;  Laterality: Left;   SINUS EXPLORATION  1996   Patient Active Problem List   Diagnosis Date Noted   Acne 09/30/2020   Body mass index (BMI) 23.0-23.9, adult 09/30/2020   Chronic fatigue syndrome 09/30/2020   History of weight gain 09/30/2020   Hot flashes 09/30/2020   Low libido 09/30/2020   Female climacteric state 09/30/2020   Menopause 09/30/2020   Primary insomnia 09/30/2020   Pure hypercholesterolemia 09/30/2020   Vaginal dryness 09/30/2020   Diabetes mellitus ([place]) 03/01/2019   Mixed diabetic hyperlipidemia associated with type 2 diabetes mellitus ([place]) 03/01/2019   Vitamin D  deficiency 03/01/2019   Lipoma of left axilla 03/13/2018   Hypertension associated with diabetes ([place]) 01/15/2018   Gastroesophageal reflux disease 01/15/2018   Perennial allergic rhinitis 09/19/2016   Rhinitis medicamentosa 09/19/2016   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   Chronic sinusitis 09/19/2016   Environmental and seasonal allergies 06/15/2016   Anemia 06/15/2016   Eczema 06/15/2016   Hypothyroidism 02/05/2016   EPISTAXIS, RECURRENT 03/24/2009   BRONCHITIS, ACUTE 10/31/2008   Sinusitis, chronic 02/29/2008   ABDOMINAL PAIN, RIGHT UPPER QUADRANT 06/29/2007   Lipoma 06/12/2007   SYNCOPE, VASOVAGAL 06/12/2007   Moderate persistent asthma 02/17/2007   Obstructive sleep apnea, hx of 02/17/2007    ONSET DATE: DOS: 10/21/21  REFERRING DIAG: M18.11 (ICD-10-CM) - Primary osteoarthritis of first carpometacarpal joint of right hand  THERAPY DIAG:  Localized edema  Muscle weakness (generalized)  Pain in joint of left hand  Other lack of coordination  Pain in joint of right hand  Stiffness of left hand, not elsewhere classified  Rationale for Evaluation and Treatment Rehabilitation  PERTINENT HISTORY: Per MD "Eval and treat s/p left thumb CMC arthroplasty.  Hand rehab and thermoplastic hand based splint to begin 10/5 or 10/6."   PRECAUTIONS: 9 weeks post-op now, follow post-op protocols: A/ROM to thumb and P/AROM to wrist as tolerated, @ 5 weeks PROM to thumb, _0  weeks light isometrics & weaning  from orthotic, PRE @ 8 weeks, caution until 10-12 weeks    WEIGHT BEARING RESTRICTIONS Yes NWB Lt thumb now  SUBJECTIVE:   SUBJECTIVE STATEMENT: She states only having very mild discomfort in left thumb and wrist at times now.  Doing exercises maybe once or twice a day, and still avoiding heavy things.  She states her right nonoperative thumb is very painful like a tooth ache at all times recently however.   PAIN:  Are you having pain? No Rating: 0/10 at rest now,  up to 1/10 in past week in Lt thumb.  Rt thumb has been 4-5/10 in past week   PATIENT GOALS: Get left hand better for life activities    OBJECTIVE: (All objective assessments below are from initial evaluation on: 11/17/21 unless otherwise specified.)    HAND DOMINANCE: Right   ADLs: Overall ADLs: States decreased ability to grab, hold household objects, pain and inability to open containers, perform FMS tasks (manipulate fasteners on clothing), mild to moderate bathing problems as well.    FUNCTIONAL OUTCOME MEASURES: 12/30/21: PSFS 9 today   Eval: Patient Specific Functional Scale: 1.6 (sew, typing, opening jar)  (Higher Score  =  Better Ability for the Selected Tasks)      UPPER EXTREMITY ROM     Shoulder to Wrist AROM Left eval Lt  11/23/21 Lt  12/30/21   Forearm supination Approx 90    Forearm pronation  Approx 90    Wrist flexion 27 50 55  Wrist extension 50 63 76  Wrist ulnar deviation 26 34 42  Wrist radial deviation _0 Functional dart thrower's motion (F-DTM) in ulnar flexion     F-DTM in radial extension      (Blank rows = not tested)   Hand AROM Left eval Lt  11/23/21 Lt 12/30/21  Full Fist Ability (or Gap to Distal Palmar Crease) Loose full fist (lack of MCP J motion)   YES  Thumb Opposition to Small Finger (or Gap) 3.8cm FULL FULL  Thumb Opposition to Base of Small Finger (or Gap)  7cm 3.5cm FULL  Thumb MCP (0-60) 15 28 36  Thumb IP (0-80) 6 21 56  Thumb Radial abd/add (0-55)      Thumb Palmar abd/add (0-45) 44  60 50  (Blank rows = not tested)   UPPER EXTREMITY MMT:    Eval:  NT at eval due to recent and still healing sx. Will be tested when appropriate.   MMT Left 12/30/21  Wrist flexion 4/5  Wrist extension 5/5  Thumb flexion 5/5  Thumb extension 4/5  (Blank rows = not tested)  HAND FUNCTION: 12/30/21: Grip strength Right:  41lbs, Left: 36 lbs    COORDINATION:  12/30/21: 9HPT: LEFT: 24sec WFL   SENSATION: 12/30/21: still  mildly numb around dorsal thumb (radial nerve)   Eval:  Light touch intact today, though diminished around sx area    EDEMA:   12/30/21: None now in Lt hand  Eval:  Mildly swollen in thumb and radial wrist today  OBSERVATIONS:  12/30/21: Left hand scar looking great.  No instability though MCP joint does easily hyperextend, which is not great.  She is cautioned to try to prevent this.  Her right thumb is slightly swollen and red painful to touch and collapse deformity is beginning there as well.  Eval: steristrips in place, only mild swelling, no redness, still with surgical iodine on arm, not overly tender to palpation but is somewhat hypersensitive around scar, which is  also mildly adhered looking   TODAY'S TREATMENT:  12/30/21: Pt performs AROM, gripping, and strength with Lt hand/thumb against resistance for exercise/activities as well as new measures today. Using that data, OT also reviews home exercises and provides additional recommendations. HEP reviewed, and OT adds concentric wrist strength with red t-bands today in wrist flexion, extension.  She tolerates these fairly well but dorsum of hand does "pull" when performing in flexion (as below). OT also discusses home and functional tasks with the pt and reviews goals. Pt states understanding and tolerates upgrades well.  She also states desire for treatment for right thumb nonoperative arthritis management.  OT discusses that she can perform similar exercises on her right hand, but she asks for these to be gone over with her.  She would also likely benefit from a rigid orthotic to help rest the right thumb as well.  Exercises - Standing Radial Nerve Glide  - 4-6 x daily - 1 sets - 10-15 reps - Wrist Flexion Stretch  - 4 x daily - 3-5 reps - 15 sec hold - Wrist Extension Stretch Pronated  - 4 x daily - 3-5 reps - 15 hold - Stretch Thumb Down (DON'T do like the picture)   - 2-3 x daily - 3-5 reps - 15 sec hold - Stretch thumb in "C"  shape  - 3-5 reps - 15 sec hold - Towel Roll Grip with Forearm in Neutral  - 1-3 x daily - 5 reps - 5 sec hold - Seated Isometric Wrist Ulnar Deviation with Manual Resistance  - 1-3 x daily - 1-2 sets - 5 reps - 5 hold -Wrist flexion and ext strength with red t-bans 2-3x day, 10-15 slow reps     PATIENT EDUCATION: Education details: See tx section above for details  Person educated: Patient Education method: Veterinary surgeon, Teach back, Handouts  Education comprehension: States and demonstrates understanding, Additional Education required    HOME EXERCISE PROGRAM: Access Code: [email or web address] URL: [email or web address]   GOALS: Goals reviewed with patient? Yes   SHORT TERM GOALS: (STG required if POC>30 days) Target Date: 11/26/21  Pt will obtain protective, custom orthotic. Goal status: MET   2.  Pt will demo/state understanding of initial HEP to improve pain levels and prerequisite motion. Goal status:11/23/21: MET   LONG TERM GOALS: Target Date: 01/28/22  Pt will improve functional ability by decreased impairment per PSFS assessment from 1.6 to 7.5 or better, for better quality of life. Goal status: 12/30/21: MET  2.  Pt will improve grip strength in Lt hand to at least 40lbs for functional use at home and in IADLs. Goal status:  12/30/21: Progressing 36# now  3.  Pt will improve A/ROM in Lt thumb MCP and IP J flexion from 15/6* respectively, to at least 40*/60* to have functional motion for tasks like grasp, pinch.  Goal status:  12/30/21: Progressing, goal updated to move from 50*/60* to 40*/60* based on current status  4.  Pt will improve strength in Lt thumb abd/adduction to at least 4/5 MMT with no pain to have increased functional ability to carry out selfcare and higher-level homecare tasks with no difficulty. Goal status:  12/30/21: MET  5.  Pt will improve coordination skills in Lt hand, as seen by better score on 9 Hole Peg Test testing to have  increased functional ability to carry out fine motor tasks (fasteners, etc.) and more complex, coordinated IADLs (meal prep, sports, etc.).  Goal status:  12/30/21: MET- WFL today  6. Pt will obtain program and custom supportive orthotic for Rt thumb CMC J OA pain and fnl issues.   Goal Status: New Goal 12/30/21    ASSESSMENT:  CLINICAL IMPRESSION: 12/30/21: She has nearly met all goals for her left thumb now, and has little functional problems.  However due to worsening right thumb pain and problems we will continue therapy to help management there and continue working on tightness and remaining pain in left hand as well.   PLAN: OT FREQUENCY: 1x/week  OT DURATION: 4 additional weeks (through  01/28/22 as needed)  PLANNED INTERVENTIONS: self care/ADL training, therapeutic exercise, therapeutic activity, neuromuscular re-education, manual therapy, scar mobilization, passive range of motion, splinting, electrical stimulation, ultrasound, fluidotherapy, compression bandaging, moist heat, cryotherapy, contrast bath, patient/family education, and coping strategies training  RECOMMENDED OTHER SERVICES: none now   CONSULTED AND AGREED WITH PLAN OF CARE: Patient  PLAN FOR NEXT SESSION:  Fabricate custom right thumb spica hand-based orthotic.  Perform HEP for right thumb and hand out.  Review for left thumb and wrist as needed.  Benito Mccreedy, OTR/L, CHT 12/30/2021, 3:49 PM

## 2021-12-30 ENCOUNTER — Ambulatory Visit: Payer: Managed Care, Other (non HMO) | Admitting: Rehabilitative and Restorative Service Providers"

## 2021-12-30 DIAGNOSIS — R600 Localized edema: Secondary | ICD-10-CM | POA: Diagnosis not present

## 2021-12-30 DIAGNOSIS — R278 Other lack of coordination: Secondary | ICD-10-CM

## 2021-12-30 DIAGNOSIS — M25542 Pain in joints of left hand: Secondary | ICD-10-CM

## 2021-12-30 DIAGNOSIS — M6281 Muscle weakness (generalized): Secondary | ICD-10-CM | POA: Diagnosis not present

## 2021-12-30 DIAGNOSIS — M25541 Pain in joints of right hand: Secondary | ICD-10-CM | POA: Diagnosis not present

## 2021-12-30 DIAGNOSIS — M25642 Stiffness of left hand, not elsewhere classified: Secondary | ICD-10-CM

## 2022-01-03 ENCOUNTER — Other Ambulatory Visit: Payer: Self-pay | Admitting: Physician Assistant

## 2022-01-03 ENCOUNTER — Encounter: Payer: Self-pay | Admitting: Rehabilitative and Restorative Service Providers"

## 2022-01-03 ENCOUNTER — Ambulatory Visit: Payer: Managed Care, Other (non HMO) | Admitting: Rehabilitative and Restorative Service Providers"

## 2022-01-03 DIAGNOSIS — M25642 Stiffness of left hand, not elsewhere classified: Secondary | ICD-10-CM

## 2022-01-03 DIAGNOSIS — R278 Other lack of coordination: Secondary | ICD-10-CM

## 2022-01-03 DIAGNOSIS — M25541 Pain in joints of right hand: Secondary | ICD-10-CM | POA: Diagnosis not present

## 2022-01-03 DIAGNOSIS — K219 Gastro-esophageal reflux disease without esophagitis: Secondary | ICD-10-CM

## 2022-01-03 DIAGNOSIS — M25542 Pain in joints of left hand: Secondary | ICD-10-CM

## 2022-01-03 DIAGNOSIS — R600 Localized edema: Secondary | ICD-10-CM

## 2022-01-03 DIAGNOSIS — M6281 Muscle weakness (generalized): Secondary | ICD-10-CM

## 2022-01-03 NOTE — Therapy (Addendum)
OUTPATIENT OCCUPATIONAL THERAPY TREATMENT & DISCHARGE NOTE  Patient Name: Dominique Brock MRN: [id number] DOB:Mar 17, 1955, 66 y.o., female Today's Date: 01/04/2022  PCP: Lorrene Reid, PA-C REFERRING PROVIDER: Leandrew Koyanagi, MD       OT End of Session - 01/03/22 1436     Visit Number 7    Number of Visits 10    Date for OT Re-Evaluation 01/28/22    Authorization Type [employer] $40 copay    OT Start Time 1436    OT Stop Time 1518    OT Time Calculation (min) 42 min    Equipment Utilized During Treatment orthotic materials    Activity Tolerance Patient tolerated treatment well;No increased pain;Patient limited by fatigue;Patient limited by pain    Behavior During Therapy [facility] for tasks assessed/performed            Past Medical History:  Diagnosis Date   Asthma    Diabetes mellitus    prediabetic diet controlled   Eczema 06/15/2016   Hypertension    Hypothyroidism    Sleep apnea    only with allergies   Thyroid disease    Past Surgical History:  Procedure Laterality Date   ABDOMINAL HYSTERECTOMY     BILATERAL OOPHORECTOMY     EXCISION MASS UPPER EXTREMETIES Left 04/18/2019   Procedure: Excision of left arm mass / lipoma;  Surgeon: Wallace Going, DO;  Location: [place];  Service: Plastics;  Laterality: Left;   SINUS EXPLORATION  1996   Patient Active Problem List   Diagnosis Date Noted   Acne 09/30/2020   Body mass index (BMI) 23.0-23.9, adult 09/30/2020   Chronic fatigue syndrome 09/30/2020   History of weight gain 09/30/2020   Hot flashes 09/30/2020   Low libido 09/30/2020   Female climacteric state 09/30/2020   Menopause 09/30/2020   Primary insomnia 09/30/2020   Pure hypercholesterolemia 09/30/2020   Vaginal dryness 09/30/2020   Diabetes mellitus ([place]) 03/01/2019   Mixed diabetic hyperlipidemia associated with type 2 diabetes mellitus ([place]) 03/01/2019   Vitamin D deficiency 03/01/2019   Lipoma of left axilla 03/13/2018    Hypertension associated with diabetes ([place]) 01/15/2018   Gastroesophageal reflux disease 01/15/2018   Perennial allergic rhinitis 09/19/2016   Rhinitis medicamentosa 09/19/2016   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   Chronic sinusitis 09/19/2016   Environmental and seasonal allergies 06/15/2016   Anemia 06/15/2016   Eczema 06/15/2016   Hypothyroidism 02/05/2016   EPISTAXIS, RECURRENT 03/24/2009   BRONCHITIS, ACUTE 10/31/2008   Sinusitis, chronic 02/29/2008   ABDOMINAL PAIN, RIGHT UPPER QUADRANT 06/29/2007   Lipoma 06/12/2007   SYNCOPE, VASOVAGAL 06/12/2007   Moderate persistent asthma 02/17/2007   Obstructive sleep apnea, hx of 02/17/2007    ONSET DATE: DOS: 10/21/21  REFERRING DIAG: M18.11 (ICD-10-CM) - Primary osteoarthritis of first carpometacarpal joint of right hand  THERAPY DIAG:  Localized edema  Pain in joint of left hand  Pain in joint of right hand  Stiffness of left hand, not elsewhere classified  Muscle weakness (generalized)  Other lack of coordination  Rationale for Evaluation and Treatment Rehabilitation  PERTINENT HISTORY: Per MD "Eval and treat s/p left thumb CMC arthroplasty.  Hand rehab and thermoplastic hand based splint to begin 10/5 or 10/6."   PRECAUTIONS: 9 weeks post-op now, follow post-op protocols: A/ROM to thumb and P/AROM to wrist as tolerated, @ 5 weeks PROM to thumb, _0  weeks light isometrics & weaning from orthotic, PRE @ 8 weeks, caution until 10-12 weeks  WEIGHT BEARING RESTRICTIONS Yes NWB Lt thumb now  SUBJECTIVE:   SUBJECTIVE STATEMENT: She states left hand and thumb continue to improve.  She states she is in need of orthotic support for right hand and thumb still.   PAIN:  Are you having pain? No Rating: 0/10 at rest now, up to 1/10 in past week in Lt thumb.  Rt thumb has been 4-5/10 in past week   PATIENT GOALS: Get left hand better for life activities    OBJECTIVE: (All objective assessments below are from  initial evaluation on: 11/17/21 unless otherwise specified.)    HAND DOMINANCE: Right   ADLs: Overall ADLs: States decreased ability to grab, hold household objects, pain and inability to open containers, perform FMS tasks (manipulate fasteners on clothing), mild to moderate bathing problems as well.    FUNCTIONAL OUTCOME MEASURES: 12/30/21: PSFS 9 today   Eval: Patient Specific Functional Scale: 1.6 (sew, typing, opening jar)  (Higher Score  =  Better Ability for the Selected Tasks)      UPPER EXTREMITY ROM     Shoulder to Wrist AROM Left eval Lt  11/23/21 Lt  12/30/21   Forearm supination Approx 90    Forearm pronation  Approx 90    Wrist flexion 27 50 55  Wrist extension 50 63 76  Wrist ulnar deviation 26 34 42  Wrist radial deviation _0 Functional dart thrower's motion (F-DTM) in ulnar flexion     F-DTM in radial extension      (Blank rows = not tested)   Hand AROM Left eval Lt  11/23/21 Lt 12/30/21  Full Fist Ability (or Gap to Distal Palmar Crease) Loose full fist (lack of MCP J motion)   YES  Thumb Opposition to Small Finger (or Gap) 3.8cm FULL FULL  Thumb Opposition to Base of Small Finger (or Gap)  7cm 3.5cm FULL  Thumb MCP (0-60) 15 28 36  Thumb IP (0-80) 6 21 56  Thumb Radial abd/add (0-55)      Thumb Palmar abd/add (0-45) 44  60 50  (Blank rows = not tested)   UPPER EXTREMITY MMT:    Eval:  NT at eval due to recent and still healing sx. Will be tested when appropriate.   MMT Left 12/30/21  Wrist flexion 4/5  Wrist extension 5/5  Thumb flexion 5/5  Thumb extension 4/5  (Blank rows = not tested)  HAND FUNCTION: 12/30/21: Grip strength Right:  41lbs, Left: 36 lbs    COORDINATION:  12/30/21: 9HPT: LEFT: 24sec WFL   SENSATION: 12/30/21: still mildly numb around dorsal thumb (radial nerve)   Eval:  Light touch intact today, though diminished around sx area    EDEMA:   12/30/21: None now in Lt hand  Eval:  Mildly swollen in thumb and  radial wrist today  OBSERVATIONS:  12/30/21: Left hand scar looking great.  No instability though MCP joint does easily hyperextend, which is not great.  She is cautioned to try to prevent this.  Her right thumb is slightly swollen and red painful to touch and collapse deformity is beginning there as well.  Eval: steristrips in place, only mild swelling, no redness, still with surgical iodine on arm, not overly tender to palpation but is somewhat hypersensitive around scar, which is also mildly adhered looking   TODAY'S TREATMENT:  01/03/22: Custom orthotic fabrication was indicated due to pt's Rt painful, arthritic thumb CMC J and need for safe, functional positioning. OT fabricated custom hand-based thumb spica  leaving IP J free orthotic for pt today to support/rest Rt thumb CMC J. It fit well with no areas of pressure, pt states a comfortable fit. Pt was educated on the wearing schedule, to call or come in ASAP if it is causing any irritation or is not achieving desired function. It will be checked/adjusted in upcoming sessions, as needed. Pt states understanding.   OT also provides the following home exercise program goes over each in depth with her, and she performs back without pain.  Exercises - Wrist Flexion Stretch  - 4 x daily - 3-5 reps - 15 sec hold - Wrist Extension Stretch Pronated  - 4 x daily - 3-5 reps - 15 hold - Stretch Thumb Down (DON'T do like the picture)   - 2-3 x daily - 3-5 reps - 15 sec hold - Stretch thumb in "C" shape  - 3-5 reps - 15 sec hold - Towel Roll Grip with Forearm in Neutral  - 1-3 x daily - 5 reps - 5 sec hold - Resisted Finger Abduction - Index and Middle  - 2-3 x daily - 5-10 reps - 2-3 sec hold - C-Strength (try using rubber band)   - 2-3 x daily - 5-10 reps - 2-3 sec hold  OT reminds her that she should also be continuing these things as needed in her left hand and thumb as well.  She states understanding   PATIENT EDUCATION: Education details: See  tx section above for details  Person educated: Patient Education method: Verbal Instruction, Teach back, Handouts  Education comprehension: States and demonstrates understanding, Additional Education required    HOME EXERCISE PROGRAM: Access Code: [email or web address] URL: [email or web address]   GOALS: Goals reviewed with patient? Yes   SHORT TERM GOALS: (STG required if POC>30 days) Target Date: 11/26/21  Pt will obtain protective, custom orthotic. Goal status: MET   2.  Pt will demo/state understanding of initial HEP to improve pain levels and prerequisite motion. Goal status:11/23/21: MET   LONG TERM GOALS: Target Date: 01/28/22  Pt will improve functional ability by decreased impairment per PSFS assessment from 1.6 to 7.5 or better, for better quality of life. Goal status: 12/30/21: MET  2.  Pt will improve grip strength in Lt hand to at least 40lbs for functional use at home and in IADLs. Goal status:  12/30/21: Progressing 36# now  3.  Pt will improve A/ROM in Lt thumb MCP and IP J flexion from 15/6* respectively, to at least 40*/60* to have functional motion for tasks like grasp, pinch.  Goal status:  12/30/21: Progressing, goal updated to move from 50*/60* to 40*/60* based on current status  4.  Pt will improve strength in Lt thumb abd/adduction to at least 4/5 MMT with no pain to have increased functional ability to carry out selfcare and higher-level homecare tasks with no difficulty. Goal status:  12/30/21: MET  5.  Pt will improve coordination skills in Lt hand, as seen by better score on 9 Hole Peg Test testing to have increased functional ability to carry out fine motor tasks (fasteners, etc.) and more complex, coordinated IADLs (meal prep, sports, etc.).  Goal status:  12/30/21: MET- WFL today   6. Pt will obtain program and custom supportive orthotic for Rt thumb CMC J OA pain and fnl issues.   Goal Status: 01/03/22 MET    ASSESSMENT:  CLINICAL  IMPRESSION: 01/03/22: She now has an orthotic support for her right hand/thumb, and feels comfortable with self managing the arthritis there.  Her left hand continues to do very well.  She states feeling confident for self-management at this point, and may not feel the need to return.  12/30/21: She has nearly met all goals for her left thumb now, and has little functional problems.  However due to worsening right thumb pain and problems we will continue therapy to help management there and continue working on tightness and remaining pain in left hand as well.   PLAN: OT FREQUENCY: 1x/week  OT DURATION: 4 additional weeks (through  01/28/22 as needed)  PLANNED INTERVENTIONS: self care/ADL training, therapeutic exercise, therapeutic activity, neuromuscular re-education, manual therapy, scar mobilization, passive range of motion, splinting, electrical stimulation, ultrasound, fluidotherapy, compression bandaging, moist heat, cryotherapy, contrast bath, patient/family education, and coping strategies training  RECOMMENDED OTHER SERVICES: none now   CONSULTED AND AGREED WITH PLAN OF CARE: Patient  PLAN FOR NEXT SESSION:  She will follow-up again as needed in the next 2 weeks.  If OT does not hear from her OT will discharge her plan of care.  She is in agreement with this.   Benito Mccreedy, OTR/L, CHT 01/04/2022, 11:19 AM    OCCUPATIONAL THERAPY DISCHARGE SUMMARY  Visits from Start of Care: 7  Current functional level related to goals / functional outcomes: Pt has met all goals to satisfactory levels and is pleased with outcomes.   Remaining deficits: Pt has no more significant functional deficits or pain.   Education / Equipment: Pt has all needed materials and education. Pt understands how to continue on with self-management. See tx notes for more details.   Patient agrees to discharge due to max benefits received from outpatient occupational therapy / hand therapy at this time.    Benito Mccreedy, OTR/L, CHT 01/26/22

## 2022-02-22 ENCOUNTER — Ambulatory Visit
Admission: RE | Admit: 2022-02-22 | Discharge: 2022-02-22 | Disposition: A | Discharge status: Home / Self Care | Payer: Managed Care, Other (non HMO) | Source: Ambulatory Visit | Attending: Physician Assistant | Admitting: Physician Assistant

## 2022-02-22 VITALS — BP 145/84 | HR 95 | Temp 98.1°F | Resp 16

## 2022-02-22 DIAGNOSIS — J0110 Acute frontal sinusitis, unspecified: Secondary | ICD-10-CM | POA: Diagnosis not present

## 2022-02-22 DIAGNOSIS — J069 Acute upper respiratory infection, unspecified: Secondary | ICD-10-CM | POA: Diagnosis not present

## 2022-02-22 MED ORDER — TRIAMCINOLONE ACETONIDE 40 MG/ML IJ SUSP
60.0000 mg | Freq: Once | INTRAMUSCULAR | Status: AC
  Administered 2022-02-22: 60 mg via INTRAMUSCULAR

## 2022-02-22 NOTE — ED Triage Notes (Signed)
Pt said x 2 weeks has been having nasal drainage, ear pressure and neck pain that has not subsided with nasal sprays, congestion meds and nasal rinsing.

## 2022-02-22 NOTE — ED Provider Notes (Signed)
[facility]    CSN: [id number] Arrival date & time: 02/22/22  1242      History   Chief Complaint Chief Complaint  Patient presents with   Ear Fullness    Entered by patient   Nasal Congestion   Neck Pain   Facial Pain    HPI Dominique Brock is a 67 y.o. female.   63-year-old female presents with sinus pain and pressure.  Patient indicates for the past 2 weeks she has been having persistent and progressive frontal and maxillary sinus pressure, pain, and tenderness.  She relates she has been having some upper respiratory congestion with rhinitis and postnasal drip, nasal congestion has mainly been clear and yellow.  She indicates she has been having some intermittent neck pain and posterior head pain along with the sinus congestion and discomfort.  She relates she has been using her sinus rinses, nasal sprays, and Sudafed but has not been able to obtain relief from the pressure and discomfort.  She indicates she has been taking some Tylenol and Motrin for pain relief.  She is without fever, nausea or vomiting.   Ear Fullness  Neck Pain   Past Medical History:  Diagnosis Date   Asthma    Diabetes mellitus    prediabetic diet controlled   Eczema 06/15/2016   Hypertension    Hypothyroidism    Sleep apnea    only with allergies   Thyroid disease     Patient Active Problem List   Diagnosis Date Noted   Acne 09/30/2020   Body mass index (BMI) 23.0-23.9, adult 09/30/2020   Chronic fatigue syndrome 09/30/2020   History of weight gain 09/30/2020   Hot flashes 09/30/2020   Low libido 09/30/2020   Female climacteric state 09/30/2020   Menopause 09/30/2020   Primary insomnia 09/30/2020   Pure hypercholesterolemia 09/30/2020   Vaginal dryness 09/30/2020   Diabetes mellitus ([place]) 03/01/2019   Mixed diabetic hyperlipidemia associated with type 2 diabetes mellitus ([place]) 03/01/2019   Vitamin D deficiency 03/01/2019   Lipoma of left axilla 03/13/2018   Hypertension  associated with diabetes ([place]) 01/15/2018   Gastroesophageal reflux disease 01/15/2018   Perennial allergic rhinitis 09/19/2016   Rhinitis medicamentosa 09/19/2016   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   Chronic sinusitis 09/19/2016   Environmental and seasonal allergies 06/15/2016   Anemia 06/15/2016   Eczema 06/15/2016   Hypothyroidism 02/05/2016   EPISTAXIS, RECURRENT 03/24/2009   BRONCHITIS, ACUTE 10/31/2008   Sinusitis, chronic 02/29/2008   ABDOMINAL PAIN, RIGHT UPPER QUADRANT 06/29/2007   Lipoma 06/12/2007   SYNCOPE, VASOVAGAL 06/12/2007   Moderate persistent asthma 02/17/2007   Obstructive sleep apnea, hx of 02/17/2007    Past Surgical History:  Procedure Laterality Date   ABDOMINAL HYSTERECTOMY     BILATERAL OOPHORECTOMY     EXCISION MASS UPPER EXTREMETIES Left 04/18/2019   Procedure: Excision of left arm mass / lipoma;  Surgeon: Wallace Going, DO;  Location: [place];  Service: Plastics;  Laterality: Left;   SINUS EXPLORATION  1996    OB History   No obstetric history on file.      Home Medications    Prior to Admission medications   Medication Sig Start Date End Date Taking? Authorizing Provider  atenolol (TENORMIN) 25 MG tablet TAKE 1 TABLET (25 MG TOTAL) BY MOUTH DAILY. 07/29/21   Abonza, Herb Grays, PA-C  atorvastatin (LIPITOR) 10 MG tablet TAKE 1 TABLET (10 MG TOTAL) BY MOUTH DAILY. **CALL TO SCHEDULE A  FOLLOW UP FOR FUTURE MED REFILLS** 04/29/21   Lorrene Reid, PA-C  azelastine (ASTELIN) 0.1 % nasal spray Place 1 spray into both nostrils 2 (two) times daily. Use in each nostril as directed 06/28/21   Lorrene Reid, PA-C  cholecalciferol (VITAMIN D3) 25 MCG (1000 UT) tablet Take 1,000 Units by mouth daily.    [provider]  Continuous Blood Gluc Receiver (DEXCOM G6 RECEIVER) DEVI Use as directed. 07/28/21   Lorrene Reid, PA-C  Continuous Blood Gluc Sensor (DEXCOM G6 SENSOR) MISC Use as directed. 07/28/21   Lorrene Reid, PA-C  Continuous Blood Gluc Transmit (DEXCOM G6 TRANSMITTER) MISC Used as directed. 07/28/21   Lorrene Reid, PA-C  fluticasone (FLONASE) 50 MCG/ACT nasal spray USE 2 SPRAY(S) IN EACH NOSTRIL ONCE DAILY 12/01/21   Lorrene Reid, PA-C  levothyroxine (SYNTHROID, LEVOTHROID) 100 MCG tablet Patient states she is taking 1/2 tablet once daily 10/19/17   [provider]  Multiple Vitamin (MULTIVITAMIN) tablet Take 1 tablet by mouth daily.    [provider]  omeprazole (PRILOSEC) 20 MG capsule TAKE 1 CAPSULE BY MOUTH EVERY DAY 01/03/22   Boscia, Greer Ee, NP  ondansetron (ZOFRAN) 4 MG tablet Take 1 tablet (4 mg total) by mouth every 8 (eight) hours as needed for nausea or vomiting. 10/19/21   Aundra Dubin, PA-C  oxyCODONE-acetaminophen (PERCOCET) 5-325 MG tablet Take 1 tablet by mouth every 6 (six) hours as needed. To be taken after surgery 10/19/21   Aundra Dubin, PA-C  progesterone 200 MG SUPP Place 100 mg vaginally at bedtime.    [provider]  spironolactone (ALDACTONE) 25 MG tablet Take 25 mg by mouth daily. 11/20/19   [provider]  imipramine (TOFRANIL) 25 MG tablet Take 1 tablet (25 mg total) by mouth at bedtime. 12/28/10 05/04/11  Baird Lyons D, MD  progesterone (PROMETRIUM) 100 MG capsule Take 100 mg by mouth daily.    05/30/19  [provider]    Family History Family History  Problem Relation Age of Onset   Hypertension Mother    Hyperlipidemia Mother    Liver cancer Mother 31       died in 05-10-2011, at age 49 or 76.   Allergies Mother    Diabetes Mother 43       onset "mid 52."   Healthy Father    Dementia Father 46       not officialy diagnosed   Healthy Other        2 siblings   Asthma Maternal Grandmother    Diabetes Maternal Grandmother    Asthma Cousin    Allergic rhinitis Neg Hx    Angioedema Neg Hx    Eczema Neg Hx    Immunodeficiency Neg Hx    Urticaria Neg Hx     Social History Social History    Tobacco Use   Smoking status: Never   Smokeless tobacco: Never  Vaping Use   Vaping Use: Never used  Substance Use Topics   Alcohol use: Yes    Comment: less than once a month   Drug use: No     Allergies   Azithromycin, Codeine, Penicillins, Metronidazole, Nitrofurantoin macrocrystal, and Shellfish allergy   Review of Systems Review of Systems  HENT:  Positive for postnasal drip, rhinorrhea, sinus pressure and sinus pain.   Musculoskeletal:  Positive for neck pain.     Physical Exam Triage Vital Signs ED Triage Vitals  Enc Vitals Group     BP 02/22/22 1314 (!)  145/84     Pulse Rate 02/22/22 1314 95     Resp 02/22/22 1314 16     Temp 02/22/22 1314 98.1 F (36.7 C)     Temp Source 02/22/22 1314 Oral     SpO2 --      Weight --      Height --      Head Circumference --      Peak Flow --      Pain Score 02/22/22 1313 5     Pain Loc --      Pain Edu? --      Excl. in [place]? --    No data found.  Updated Vital Signs BP (!) 145/84 (BP Location: Right Arm)   Pulse 95   Temp 98.1 F (36.7 C) (Oral)   Resp 16   Visual Acuity Right Eye Distance:   Left Eye Distance:   Bilateral Distance:    Right Eye Near:   Left Eye Near:    Bilateral Near:     Physical Exam Constitutional:      Appearance: Normal appearance.  HENT:     Right Ear: Ear canal normal. Tympanic membrane is injected.     Left Ear: Ear canal normal. Tympanic membrane is injected.     Mouth/Throat:     Mouth: Mucous membranes are moist.     Pharynx: Oropharynx is clear. No posterior oropharyngeal erythema.     Comments: Face: He has palpated along the frontal and maxillary sinuses bilaterally. Cardiovascular:     Rate and Rhythm: Normal rate and regular rhythm.     Heart sounds: Normal heart sounds.  Pulmonary:     Effort: Pulmonary effort is normal.     Breath sounds: Normal breath sounds and air entry. No wheezing, rhonchi or rales.  Lymphadenopathy:     Cervical: No cervical  adenopathy.  Neurological:     Mental Status: She is alert.      UC Treatments / Results  Labs (all labs ordered are listed, but only abnormal results are displayed) Labs Reviewed - No data to display  EKG   Radiology No results found.  Procedures Procedures (including critical care time)  Medications Ordered in UC Medications  triamcinolone acetonide (KENALOG-40) injection 60 mg (has no administration in time range)    Initial Impression / Assessment and Plan / UC Course  I have reviewed the triage vital signs and the nursing notes.  Pertinent labs & imaging results that were available during my care of the patient were reviewed by me and considered in my medical decision making (see chart for details).    Plan: 1.  The acute frontal is will be treated with the following: A.  Aristocort 60 mg given IM in the office today. B.  Prednisone 10 mg 3 times a day for 5 days only. C.  Patient advised to continue with sinus rinses, and nasal sprays. 2.  The acute upper respiratory infection will be treated with the following: A.  Prednisone 10 mg 3 times a day for 5 days only to reduce the sinus congestion. B.  Advised patient take Tylenol or ibuprofen as needed for headache and body discomfort. 3.  Advised follow-up PCP or return to urgent care if symptoms fail to improve. Final Clinical Impressions(s) / UC Diagnoses   Final diagnoses:  Acute non-recurrent frontal sinusitis  Acute upper respiratory infection     Discharge Instructions      Advised to continue using the nasal sprays to help  decrease sinus congestion. Advised to continue using the Nettie pot to help decrease sinus congestion. Advised to take ibuprofen or Tylenol to help reduce pain and discomfort. Advised take prednisone 10 mg 3 times a day for 5 days only as this will help reduce sinus congestion and reduce inflammatory response. Advised follow-up PCP or return to urgent care if symptoms fail to  improve.    ED Prescriptions   None    PDMP not reviewed this encounter.   Nyoka Lint, PA-C 02/22/22 1329

## 2022-02-22 NOTE — Discharge Instructions (Addendum)
Advised to continue using the nasal sprays to help decrease sinus congestion. Advised to continue using the Nettie pot to help decrease sinus congestion. Advised to take ibuprofen or Tylenol to help reduce pain and discomfort. Advised take prednisone 10 mg 3 times a day for 5 days only as this will help reduce sinus congestion and reduce inflammatory response. Advised follow-up PCP or return to urgent care if symptoms fail to improve.

## 2022-02-23 ENCOUNTER — Telehealth: Payer: Self-pay

## 2022-02-23 DIAGNOSIS — E1159 Type 2 diabetes mellitus with other circulatory complications: Secondary | ICD-10-CM

## 2022-02-23 MED ORDER — ATENOLOL 25 MG PO TABS: 25.0000 mg | ORAL_TABLET | Freq: Every day | ORAL | 0 refills | Status: DC

## 2022-02-23 NOTE — Telephone Encounter (Signed)
30 day refill sent

## 2022-02-23 NOTE — Telephone Encounter (Signed)
Pt is requesting a refill on: atenolol (TENORMIN) 25 MG tablet   Pharmacy: [facility], [place]   LOV 07/28/21 ROV: 04/06/22

## 2022-03-01 ENCOUNTER — Telehealth: Payer: Managed Care, Other (non HMO) | Admitting: Physician Assistant

## 2022-03-01 DIAGNOSIS — B9689 Other specified bacterial agents as the cause of diseases classified elsewhere: Secondary | ICD-10-CM | POA: Diagnosis not present

## 2022-03-01 DIAGNOSIS — J069 Acute upper respiratory infection, unspecified: Secondary | ICD-10-CM

## 2022-03-01 MED ORDER — PREDNISONE 10 MG PO TABS: 30.0000 mg | ORAL_TABLET | Freq: Every day | ORAL | 0 refills | Status: AC

## 2022-03-01 MED ORDER — DOXYCYCLINE HYCLATE 100 MG PO TABS: 100.0000 mg | ORAL_TABLET | Freq: Two times a day (BID) | ORAL | 0 refills | Status: DC

## 2022-03-01 MED ORDER — PROMETHAZINE-DM 6.25-15 MG/5ML PO SYRP: 5.0000 mL | ORAL_SOLUTION | Freq: Four times a day (QID) | ORAL | 0 refills | Status: DC | PRN

## 2022-03-01 MED ORDER — BENZONATATE 100 MG PO CAPS: 100.0000 mg | ORAL_CAPSULE | Freq: Three times a day (TID) | ORAL | 0 refills | Status: DC | PRN

## 2022-03-01 NOTE — Patient Instructions (Signed)
Dominique Brock, thank you for joining [name], PA-C for today's virtual visit.  While this provider is not your primary care provider (PCP), if your PCP is located in our provider database this encounter information will be shared with them immediately following your visit.   [facility] account gives you access to today's visit and all your visits, tests, and labs performed at [facility] " click here if you don't have a [facility] account or go to [email or web address]  Consent: (Patient) Dominique Brock provided verbal consent for this virtual visit at the beginning of the encounter.  Current Medications:  Current Outpatient Medications:    benzonatate (TESSALON) 100 MG capsule, Take 1 capsule (100 mg total) by mouth 3 (three) times daily as needed., Disp: 30 capsule, Rfl: 0   doxycycline (VIBRA-TABS) 100 MG tablet, Take 1 tablet (100 mg total) by mouth 2 (two) times daily., Disp: 20 tablet, Rfl: 0   predniSONE (DELTASONE) 10 MG tablet, Take 3 tablets (30 mg total) by mouth daily with breakfast for 5 days., Disp: 15 tablet, Rfl: 0   promethazine-dextromethorphan (PROMETHAZINE-DM) 6.25-15 MG/5ML syrup, Take 5 mLs by mouth 4 (four) times daily as needed., Disp: 118 mL, Rfl: 0   atenolol (TENORMIN) 25 MG tablet, Take 1 tablet (25 mg total) by mouth daily., Disp: 30 tablet, Rfl: 0   atorvastatin (LIPITOR) 10 MG tablet, TAKE 1 TABLET (10 MG TOTAL) BY MOUTH DAILY. **CALL TO SCHEDULE A FOLLOW UP FOR FUTURE MED REFILLS**, Disp: 15 tablet, Rfl: 0   azelastine (ASTELIN) 0.1 % nasal spray, Place 1 spray into both nostrils 2 (two) times daily. Use in each nostril as directed, Disp: 30 mL, Rfl: 2   cholecalciferol (VITAMIN D3) 25 MCG (1000 UT) tablet, Take 1,000 Units by mouth daily., Disp: , Rfl:    Continuous Blood Gluc Receiver (DEXCOM G6 RECEIVER) DEVI, Use as directed., Disp: 1 each, Rfl: 0   Continuous Blood Gluc Sensor (DEXCOM G6 SENSOR) MISC, Use as  directed., Disp: 3 each, Rfl: 6   Continuous Blood Gluc Transmit (DEXCOM G6 TRANSMITTER) MISC, Used as directed., Disp: 1 each, Rfl: 3   fluticasone (FLONASE) 50 MCG/ACT nasal spray, USE 2 SPRAY(S) IN EACH NOSTRIL ONCE DAILY, Disp: 9.9 mL, Rfl: 0   levothyroxine (SYNTHROID, LEVOTHROID) 100 MCG tablet, Patient states she is taking 1/2 tablet once daily, Disp: , Rfl: 2   Multiple Vitamin (MULTIVITAMIN) tablet, Take 1 tablet by mouth daily., Disp: , Rfl:    omeprazole (PRILOSEC) 20 MG capsule, TAKE 1 CAPSULE BY MOUTH EVERY DAY, Disp: 90 capsule, Rfl: 0   ondansetron (ZOFRAN) 4 MG tablet, Take 1 tablet (4 mg total) by mouth every 8 (eight) hours as needed for nausea or vomiting., Disp: 40 tablet, Rfl: 0   oxyCODONE-acetaminophen (PERCOCET) 5-325 MG tablet, Take 1 tablet by mouth every 6 (six) hours as needed. To be taken after surgery, Disp: 40 tablet, Rfl: 0   progesterone 200 MG SUPP, Place 100 mg vaginally at bedtime., Disp: , Rfl:    spironolactone (ALDACTONE) 25 MG tablet, Take 25 mg by mouth daily., Disp: , Rfl:    Medications ordered in this encounter:  Meds ordered this encounter  Medications   doxycycline (VIBRA-TABS) 100 MG tablet    Sig: Take 1 tablet (100 mg total) by mouth 2 (two) times daily.    Dispense:  20 tablet    Refill:  0    Order Specific Question:   Supervising Provider  Answer:   Chase Picket [[id number]]   predniSONE (DELTASONE) 10 MG tablet    Sig: Take 3 tablets (30 mg total) by mouth daily with breakfast for 5 days.    Dispense:  15 tablet    Refill:  0    Order Specific Question:   Supervising Provider    Answer:   Chase Picket [id number]   promethazine-dextromethorphan (PROMETHAZINE-DM) 6.25-15 MG/5ML syrup    Sig: Take 5 mLs by mouth 4 (four) times daily as needed.    Dispense:  118 mL    Refill:  0    Order Specific Question:   Supervising Provider    Answer:   Chase Picket [[id number]]   benzonatate (TESSALON) 100 MG capsule    Sig: Take 1  capsule (100 mg total) by mouth 3 (three) times daily as needed.    Dispense:  30 capsule    Refill:  0    Order Specific Question:   Supervising Provider    Answer:   Chase Picket [id number]     *If you need refills on other medications prior to your next appointment, please contact your pharmacy*  Follow-Up: Call back or seek an in-person evaluation if the symptoms worsen or if the condition fails to improve as anticipated.  [facility] [id number]  Other Instructions  Upper Respiratory Infection, Adult An upper respiratory infection (URI) is a common viral infection of the nose, throat, and upper air passages that lead to the lungs. The most common type of URI is the common cold. URIs usually get better on their own, without medical treatment. What are the causes? A URI is caused by a virus. You may catch a virus by: Breathing in droplets from an infected person's cough or sneeze. Touching something that has been exposed to the virus (is contaminated) and then touching your mouth, nose, or eyes. What increases the risk? You are more likely to get a URI if: You are very young or very old. You have close contact with others, such as at work, school, or a health care facility. You smoke. You have long-term (chronic) heart or lung disease. You have a weakened disease-fighting system (immune system). You have nasal allergies or asthma. You are experiencing a lot of stress. You have poor nutrition. What are the signs or symptoms? A URI usually involves some of the following symptoms: Runny or stuffy (congested) nose. Cough. Sneezing. Sore throat. Headache. Fatigue. Fever. Loss of appetite. Pain in your forehead, behind your eyes, and over your cheekbones (sinus pain). Muscle aches. Redness or irritation of the eyes. Pressure in the ears or face. How is this diagnosed? This condition may be diagnosed based on your medical history and symptoms, and a  physical exam. Your health care provider may use a swab to take a mucus sample from your nose (nasal swab). This sample can be tested to determine what virus is causing the illness. How is this treated? URIs usually get better on their own within 7-10 days. Medicines cannot cure URIs, but your health care provider may recommend certain medicines to help relieve symptoms, such as: Over-the-counter cold medicines. Cough suppressants. Coughing is a type of defense against infection that helps to clear the respiratory system, so take these medicines only as recommended by your health care provider. Fever-reducing medicines. Follow these instructions at home: Activity Rest as needed. If you have a fever, stay home from work or school until your fever is gone or until your  health care provider says your URI cannot spread to other people (is no longer contagious). Your health care provider may have you wear a face mask to prevent your infection from spreading. Relieving symptoms Gargle with a mixture of salt and water 3-4 times a day or as needed. To make salt water, completely dissolve -1 tsp (3-6 g) of salt in 1 cup (237 mL) of warm water. Use a cool-mist humidifier to add moisture to the air. This can help you breathe more easily. Eating and drinking  Drink enough fluid to keep your urine pale yellow. Eat soups and other clear broths. General instructions  Take over-the-counter and prescription medicines only as told by your health care provider. These include cold medicines, fever reducers, and cough suppressants. Do not use any products that contain nicotine or tobacco. These products include cigarettes, chewing tobacco, and vaping devices, such as e-cigarettes. If you need help quitting, ask your health care provider. Stay away from secondhand smoke. Stay up to date on all immunizations, including the yearly (annual) flu vaccine. Keep all follow-up visits. This is important. How to prevent  the spread of infection to others URIs can be contagious. To prevent the infection from spreading: Wash your hands with soap and water for at least 20 seconds. If soap and water are not available, use hand sanitizer. Avoid touching your mouth, face, eyes, or nose. Cough or sneeze into a tissue or your sleeve or elbow instead of into your hand or into the air.  Contact a health care provider if: You are getting worse instead of better. You have a fever or chills. Your mucus is brown or red. You have yellow or brown discharge coming from your nose. You have pain in your face, especially when you bend forward. You have swollen neck glands. You have pain while swallowing. You have white areas in the back of your throat. Get help right away if: You have shortness of breath that gets worse. You have severe or persistent: Headache. Ear pain. Sinus pain. Chest pain. You have chronic lung disease along with any of the following: Making high-pitched whistling sounds when you breathe, most often when you breathe out (wheezing). Prolonged cough (more than 14 days). Coughing up blood. A change in your usual mucus. You have a stiff neck. You have changes in your: Vision. Hearing. Thinking. Mood. These symptoms may be an emergency. Get help right away. Call 911. Do not wait to see if the symptoms will go away. Do not drive yourself to the hospital. Summary An upper respiratory infection (URI) is a common infection of the nose, throat, and upper air passages that lead to the lungs. A URI is caused by a virus. URIs usually get better on their own within 7-10 days. Medicines cannot cure URIs, but your health care provider may recommend certain medicines to help relieve symptoms. This information is not intended to replace advice given to you by your health care provider. Make sure you discuss any questions you have with your health care provider. Document Revised: 09/02/2020 Document  Reviewed: 09/02/2020 Elsevier Patient Education  [place].    If you have been instructed to have an in-person evaluation today at a local Urgent Care facility, please use the link below. It will take you to a list of all of our available [facility], including address, phone number and hours of operation. Please do not delay care.  [facility]  If you or a family member do  not have a primary care provider, use the link below to schedule a visit and establish care. When you choose a [facility] primary care physician or advanced practice provider, you gain a long-term partner in health. Find a Primary Care Provider  Learn more about [facility]'s in-office and virtual care options: [facility]

## 2022-03-01 NOTE — Progress Notes (Signed)
Virtual Visit Consent   [name], you are scheduled for a virtual visit with a [facility] provider today. Just as with appointments in the office, your consent must be obtained to participate. Your consent will be active for this visit and any virtual visit you may have with one of our providers in the next 365 days. If you have a MyChart account, a copy of this consent can be sent to you electronically.  As this is a virtual visit, video technology does not allow for your provider to perform a traditional examination. This may limit your provider's ability to fully assess your condition. If your provider identifies any concerns that need to be evaluated in person or the need to arrange testing (such as labs, EKG, etc.), we will make arrangements to do so. Although advances in technology are sophisticated, we cannot ensure that it will always work on either your end or our end. If the connection with a video visit is poor, the visit may have to be switched to a telephone visit. With either a video or telephone visit, we are not always able to ensure that we have a secure connection.  By engaging in this virtual visit, you consent to the provision of healthcare and authorize for your insurance to be billed (if applicable) for the services provided during this visit. Depending on your insurance coverage, you may receive a charge related to this service.  I need to obtain your verbal consent now. Are you willing to proceed with your visit today? [name] has provided verbal consent on 03/01/2022 for a virtual visit (video or telephone). Dominique Daring, PA-C  Date: 03/01/2022 3:21 PM  Virtual Visit via Video Note   I, Dominique Brock, connected with  Dominique Brock  ([id number], 1955-10-21) on 03/01/22 at  3:15 PM EST by a video-enabled telemedicine application and verified that I am speaking with the correct person using two identifiers.  Location: Patient: Virtual Visit Location  Patient: Home Provider: Virtual Visit Location Provider: Home Office   I discussed the limitations of evaluation and management by telemedicine and the availability of in person appointments. The patient expressed understanding and agreed to proceed.    History of Present Illness: Dominique Brock is a 67 y.o. who identifies as a female who was assigned female at birth, and is being seen today for URI symptoms.  HPI: URI  This is a new problem. Episode onset: was seen in person at [facility] on 02/22/22 for similar complaint; treated with Triamcinolone '60mg'$  IM in office and prednisone '30mg'$  daily for 5 days following; reports the prednisone oral was never sent in to the pharmacy. Symptoms recurring. The problem has been gradually worsening. There has been no fever. Associated symptoms include congestion, coughing, ear pain, headaches, neck pain, rhinorrhea, sinus pain, a sore throat (from coughing) and swollen glands. Pertinent negatives include no wheezing. Associated symptoms comments: Chills started today; all symptoms recurring from previous treatment. The treatment provided no relief.     Problems:  Patient Active Problem List   Diagnosis Date Noted   Acne 09/30/2020   Body mass index (BMI) 23.0-23.9, adult 09/30/2020   Chronic fatigue syndrome 09/30/2020   History of weight gain 09/30/2020   Hot flashes 09/30/2020   Low libido 09/30/2020   Female climacteric state 09/30/2020   Menopause 09/30/2020   Primary insomnia 09/30/2020   Pure hypercholesterolemia 09/30/2020   Vaginal dryness 09/30/2020   Diabetes mellitus ([place]) 03/01/2019   Mixed diabetic  hyperlipidemia associated with type 2 diabetes mellitus ([place]) 03/01/2019   Vitamin D deficiency 03/01/2019   Lipoma of left axilla 03/13/2018   Hypertension associated with diabetes ([place]) 01/15/2018   Gastroesophageal reflux disease 01/15/2018   Perennial allergic rhinitis 09/19/2016   Rhinitis medicamentosa 09/19/2016   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   Chronic sinusitis 09/19/2016   Environmental and seasonal allergies 06/15/2016   Anemia 06/15/2016   Eczema 06/15/2016   Hypothyroidism 02/05/2016   EPISTAXIS, RECURRENT 03/24/2009   BRONCHITIS, ACUTE 10/31/2008   Sinusitis, chronic 02/29/2008   ABDOMINAL PAIN, RIGHT UPPER QUADRANT 06/29/2007   Lipoma 06/12/2007   SYNCOPE, VASOVAGAL 06/12/2007   Moderate persistent asthma 02/17/2007   Obstructive sleep apnea, hx of 02/17/2007    Allergies:  Allergies  Allergen Reactions   Azithromycin Rash    REACTION: rash   Codeine Nausea And Vomiting   Penicillins Rash   Metronidazole Diarrhea   Nitrofurantoin Macrocrystal    Shellfish Allergy Rash   Medications:  Current Outpatient Medications:    benzonatate (TESSALON) 100 MG capsule, Take 1 capsule (100 mg total) by mouth 3 (three) times daily as needed., Disp: 30 capsule, Rfl: 0   doxycycline (VIBRA-TABS) 100 MG tablet, Take 1 tablet (100 mg total) by mouth 2 (two) times daily., Disp: 20 tablet, Rfl: 0   predniSONE (DELTASONE) 10 MG tablet, Take 3 tablets (30 mg total) by mouth daily with breakfast for 5 days., Disp: 15 tablet, Rfl: 0   promethazine-dextromethorphan (PROMETHAZINE-DM) 6.25-15 MG/5ML syrup, Take 5 mLs by mouth 4 (four) times daily as needed., Disp: 118 mL, Rfl: 0   atenolol (TENORMIN) 25 MG tablet, Take 1 tablet (25 mg total) by mouth daily., Disp: 30 tablet, Rfl: 0   atorvastatin (LIPITOR) 10 MG tablet, TAKE 1 TABLET (10 MG TOTAL) BY MOUTH DAILY. **CALL TO SCHEDULE A FOLLOW UP FOR FUTURE MED REFILLS**, Disp: 15 tablet, Rfl: 0   azelastine (ASTELIN) 0.1 % nasal spray, Place 1 spray into both nostrils 2 (two) times daily. Use in each nostril as directed, Disp: 30 mL, Rfl: 2   cholecalciferol (VITAMIN D3) 25 MCG (1000 UT) tablet, Take 1,000 Units by mouth daily., Disp: , Rfl:    Continuous Blood Gluc Receiver (DEXCOM G6 RECEIVER) DEVI, Use as directed., Disp: 1 each, Rfl: 0   Continuous  Blood Gluc Sensor (DEXCOM G6 SENSOR) MISC, Use as directed., Disp: 3 each, Rfl: 6   Continuous Blood Gluc Transmit (DEXCOM G6 TRANSMITTER) MISC, Used as directed., Disp: 1 each, Rfl: 3   fluticasone (FLONASE) 50 MCG/ACT nasal spray, USE 2 SPRAY(S) IN EACH NOSTRIL ONCE DAILY, Disp: 9.9 mL, Rfl: 0   levothyroxine (SYNTHROID, LEVOTHROID) 100 MCG tablet, Patient states she is taking 1/2 tablet once daily, Disp: , Rfl: 2   Multiple Vitamin (MULTIVITAMIN) tablet, Take 1 tablet by mouth daily., Disp: , Rfl:    omeprazole (PRILOSEC) 20 MG capsule, TAKE 1 CAPSULE BY MOUTH EVERY DAY, Disp: 90 capsule, Rfl: 0   ondansetron (ZOFRAN) 4 MG tablet, Take 1 tablet (4 mg total) by mouth every 8 (eight) hours as needed for nausea or vomiting., Disp: 40 tablet, Rfl: 0   oxyCODONE-acetaminophen (PERCOCET) 5-325 MG tablet, Take 1 tablet by mouth every 6 (six) hours as needed. To be taken after surgery, Disp: 40 tablet, Rfl: 0   progesterone 200 MG SUPP, Place 100 mg vaginally at bedtime., Disp: , Rfl:    spironolactone (ALDACTONE) 25 MG tablet, Take 25 mg by mouth daily., Disp: , Rfl:  Observations/Objective: Patient is well-developed, well-nourished in no acute distress.  Resting comfortably at home.  Head is normocephalic, atraumatic.  No labored breathing. Speech is clear and coherent with logical content.  Patient is alert and oriented at baseline.    Assessment and Plan: 1. Bacterial upper respiratory infection - doxycycline (VIBRA-TABS) 100 MG tablet; Take 1 tablet (100 mg total) by mouth 2 (two) times daily.  Dispense: 20 tablet; Refill: 0 - predniSONE (DELTASONE) 10 MG tablet; Take 3 tablets (30 mg total) by mouth daily with breakfast for 5 days.  Dispense: 15 tablet; Refill: 0 - promethazine-dextromethorphan (PROMETHAZINE-DM) 6.25-15 MG/5ML syrup; Take 5 mLs by mouth 4 (four) times daily as needed.  Dispense: 118 mL; Refill: 0 - benzonatate (TESSALON) 100 MG capsule; Take 1 capsule (100 mg total) by  mouth 3 (three) times daily as needed.  Dispense: 30 capsule; Refill: 0  - Worsening over a week - Will treat with Doxycycline, Prednisone, Promethazine DM and tessalon perles - Can continue Mucinex  - Push fluids.  - Rest.  - Steam and humidifier can help - Seek in person evaluation if worsening or symptoms fail to improve    Follow Up Instructions: I discussed the assessment and treatment plan with the patient. The patient was provided an opportunity to ask questions and all were answered. The patient agreed with the plan and demonstrated an understanding of the instructions.  A copy of instructions were sent to the patient via MyChart unless otherwise noted below.    The patient was advised to call back or seek an in-person evaluation if the symptoms worsen or if the condition fails to improve as anticipated.  Time:  I spent 11 minutes with the patient via telehealth technology discussing the above problems/concerns.    Dominique Daring, PA-C

## 2022-03-22 ENCOUNTER — Other Ambulatory Visit: Payer: Self-pay | Admitting: Nurse Practitioner

## 2022-03-22 DIAGNOSIS — E1159 Type 2 diabetes mellitus with other circulatory complications: Secondary | ICD-10-CM

## 2022-04-03 ENCOUNTER — Other Ambulatory Visit: Payer: Self-pay | Admitting: Nurse Practitioner

## 2022-04-03 DIAGNOSIS — K219 Gastro-esophageal reflux disease without esophagitis: Secondary | ICD-10-CM

## 2022-04-06 ENCOUNTER — Ambulatory Visit (INDEPENDENT_AMBULATORY_CARE_PROVIDER_SITE_OTHER): Payer: Medicare Other | Admitting: Family Medicine

## 2022-04-06 ENCOUNTER — Encounter: Payer: Self-pay | Admitting: Family Medicine

## 2022-04-06 VITALS — BP 152/85 | HR 84 | Resp 18 | Ht 64.0 in | Wt 140.0 lb

## 2022-04-06 DIAGNOSIS — E1159 Type 2 diabetes mellitus with other circulatory complications: Secondary | ICD-10-CM

## 2022-04-06 DIAGNOSIS — K219 Gastro-esophageal reflux disease without esophagitis: Secondary | ICD-10-CM

## 2022-04-06 DIAGNOSIS — E559 Vitamin D deficiency, unspecified: Secondary | ICD-10-CM

## 2022-04-06 DIAGNOSIS — E1169 Type 2 diabetes mellitus with other specified complication: Secondary | ICD-10-CM

## 2022-04-06 DIAGNOSIS — E782 Mixed hyperlipidemia: Secondary | ICD-10-CM

## 2022-04-06 DIAGNOSIS — I152 Hypertension secondary to endocrine disorders: Secondary | ICD-10-CM

## 2022-04-06 DIAGNOSIS — K589 Irritable bowel syndrome without diarrhea: Secondary | ICD-10-CM | POA: Insufficient documentation

## 2022-04-06 MED ORDER — ATORVASTATIN CALCIUM 10 MG PO TABS: ORAL_TABLET | ORAL | 1 refills | Status: DC

## 2022-04-06 MED ORDER — ATENOLOL 25 MG PO TABS: 25.0000 mg | ORAL_TABLET | Freq: Every day | ORAL | 0 refills | Status: DC

## 2022-04-06 MED ORDER — OMEPRAZOLE 20 MG PO CPDR: DELAYED_RELEASE_CAPSULE | ORAL | 1 refills | Status: DC

## 2022-04-06 NOTE — Assessment & Plan Note (Signed)
Currently taking vitamin D supplement.  Testing vitamin D today.

## 2022-04-06 NOTE — Assessment & Plan Note (Signed)
Has not taken atorvastatin for quite some time.  She is aware that we may need to restart it given her risk, but she wants to see what her cholesterol levels are at before adding another medication to her regimen.  Once her lab results are in I will let her know if the atorvastatin needs to be restarted.  She is agreeable to this plan.

## 2022-04-06 NOTE — Assessment & Plan Note (Signed)
Stable, continue omeprazole.  Will continue to monitor.

## 2022-04-06 NOTE — Progress Notes (Signed)
Established Patient Office Visit  Subjective   Patient ID: Dominique Brock, female    DOB: Apr 04, 1955  Age: 67 y.o. MRN: [id number]  Chief Complaint  Patient presents with   Medical Management of Chronic Issues   Diabetes   Hypertension    HPI Dominique Brock is a 67 y.o. female presenting today for follow up of diabetes, hypertension, hyperlipidemia. Diabetes: denies hypoglycemic events, wounds or sores that are not healing well, increased thirst.  Endorses polyuria, fatigue, headache.  Denies vision problems.Checking glucose at home, ranges have been consistently in the 2 and 300s, sometimes up to the 400s no matter what she eats.  She is not on any medication.  Has been following low-carb diet and is meeting with the nutritionist this upcoming Friday to see what other changes she can be making. Hypertension: Patient here for follow-up of elevated blood pressure. She is not exercising and is adherent to low salt diet.  Blood pressure is well controlled at home.  Pt denies chest pain, SOB, dizziness, edema, syncope, fatigue or heart palpitations. Taking atenolol, reports excellent compliance with treatment. Denies side effects. Hyperlipidemia: She is currently not taking atorvastatin and says that she has not taken it for quite some time.  Currently consuming a  low-carb  diet.  She would like to walk more often but has been dealing with a lot of stress between her job, taking care of of her husband and attending doctors appointments with him with his recent diagnosis of A-fib, taking care of their dog, and starting a new business. The 10-year ASCVD risk score (Arnett DK, et al., 2019) is: 21.1%  ROS Negative unless otherwise noted in HPI   Objective:     BP (!) 152/85 (BP Location: Left Arm, Patient Position: Sitting, Cuff Size: Large)   Pulse 84   Resp 18   Ht 5' 4"$  (1.626 m)   Wt 140 lb (63.5 kg)   SpO2 99%   BMI 24.03 kg/m   Physical Exam Constitutional:      General: She is  not in acute distress.    Appearance: Normal appearance.  HENT:     Head: Normocephalic and atraumatic.  Cardiovascular:     Rate and Rhythm: Normal rate and regular rhythm.     Pulses: Normal pulses.     Heart sounds: No murmur heard.    No friction rub. No gallop.  Pulmonary:     Effort: Pulmonary effort is normal. No respiratory distress.     Breath sounds: No wheezing, rhonchi or rales.  Skin:    General: Skin is warm and dry.  Neurological:     Mental Status: She is alert and oriented to person, place, and time.     Assessment & Plan:  Hypertension associated with diabetes ([place]) Assessment & Plan: BP elevated today, she was not surprised given her recent stress.  She wants to come back for a follow-up visit in 1 month to see if there are any medication changes that she needs to make based on both labs and blood pressure/vitals at that point.  Discussed the importance of being consistent in her medication, encouraged her to start walking for both her health and stress reduction.  Orders: -     CBC with Differential/Platelet; Future -     Comprehensive metabolic panel; Future -     Lipid panel; Future -     Hemoglobin A1c; Future -     Atenolol; Take 1 tablet (25 mg total) by  mouth daily.  Dispense: 30 tablet; Refill: 0  Type 2 diabetes mellitus with other specified complication, without long-term current use of insulin ([place]) Assessment & Plan: Previously has been managing with lifestyle changes.  She believes that her A1c likely went up given that her numbers when checking at home have been in the 200s and 300s.  She plans to meet with the nutritionist in a couple days when she has her lab results so that they can work together.  She would like a referral to see endocrinology to discuss additional medication options in conjunction with nutrition after our 1 month follow-up appointment to give her some time to implement the nutritionist suggestions first.  Checking A1c, CBC, CMP  today.  Orders: -     CBC with Differential/Platelet; Future -     Comprehensive metabolic panel; Future -     Lipid panel; Future -     Hemoglobin A1c; Future  Mixed diabetic hyperlipidemia associated with type 2 diabetes mellitus ([place]) Assessment & Plan: Has not taken atorvastatin for quite some time.  She is aware that we may need to restart it given her risk, but she wants to see what her cholesterol levels are at before adding another medication to her regimen.  Once her lab results are in I will let her know if the atorvastatin needs to be restarted.  She is agreeable to this plan.  Orders: -     Lipid panel; Future -     Atorvastatin Calcium; TAKE 1 TABLET (10 MG TOTAL) BY MOUTH DAILY. **CALL TO SCHEDULE A FOLLOW UP FOR FUTURE MED REFILLS**  Dispense: 90 tablet; Refill: 1  Vitamin D deficiency Assessment & Plan: Currently taking vitamin D supplement.  Testing vitamin D today.  Orders: -     VITAMIN D 25 Hydroxy (Vit-D Deficiency, Fractures); Future  Gastroesophageal reflux disease without esophagitis Assessment & Plan: Stable, continue omeprazole.  Will continue to monitor.  Orders: -     Omeprazole; TAKE 1 CAPSULE BY MOUTH EVERY DAY  Dispense: 90 capsule; Refill: 1   Return in about 4 weeks (around 05/04/2022) for follow-up.    Velva Harman, PA

## 2022-04-06 NOTE — Assessment & Plan Note (Signed)
BP elevated today, she was not surprised given her recent stress.  She wants to come back for a follow-up visit in 1 month to see if there are any medication changes that she needs to make based on both labs and blood pressure/vitals at that point.  Discussed the importance of being consistent in her medication, encouraged her to start walking for both her health and stress reduction.

## 2022-04-06 NOTE — Assessment & Plan Note (Addendum)
Previously has been managing with lifestyle changes.  She believes that her A1c likely went up given that her numbers when checking at home have been in the 200s and 300s.  She plans to meet with the nutritionist in a couple days when she has her lab results so that they can work together.  She would like a referral to see endocrinology to discuss additional medication options in conjunction with nutrition after our 1 month follow-up appointment to give her some time to implement the nutritionist suggestions first.  Checking A1c, CBC, CMP today.  Will check urine microalbumin at follow-up appointment in 1 month.

## 2022-04-07 ENCOUNTER — Other Ambulatory Visit: Payer: Medicare Other

## 2022-04-07 DIAGNOSIS — E1159 Type 2 diabetes mellitus with other circulatory complications: Secondary | ICD-10-CM

## 2022-04-07 DIAGNOSIS — E1169 Type 2 diabetes mellitus with other specified complication: Secondary | ICD-10-CM

## 2022-04-07 DIAGNOSIS — E559 Vitamin D deficiency, unspecified: Secondary | ICD-10-CM

## 2022-04-08 ENCOUNTER — Other Ambulatory Visit: Payer: Self-pay | Admitting: Family Medicine

## 2022-04-08 LAB — COMPREHENSIVE METABOLIC PANEL
ALT: 30 IU/L (ref 0–32)
AST: 18 IU/L (ref 0–40)
Albumin/Globulin Ratio: 1.6 (ref 1.2–2.2)
Albumin: 3.9 g/dL (ref 3.9–4.9)
Alkaline Phosphatase: 70 IU/L (ref 44–121)
BUN/Creatinine Ratio: 22 (ref 12–28)
BUN: 21 mg/dL (ref 8–27)
Bilirubin Total: 0.4 mg/dL (ref 0.0–1.2)
CO2: 24 mmol/L (ref 20–29)
Calcium: 9 mg/dL (ref 8.7–10.3)
Chloride: 101 mmol/L (ref 96–106)
Creatinine, Ser: 0.95 mg/dL (ref 0.57–1.00)
Globulin, Total: 2.4 g/dL (ref 1.5–4.5)
Glucose: 204 mg/dL — ABNORMAL HIGH (ref 70–99)
Potassium: 4.2 mmol/L (ref 3.5–5.2)
Sodium: 137 mmol/L (ref 134–144)
Total Protein: 6.3 g/dL (ref 6.0–8.5)
eGFR: 66 mL/min/{1.73_m2} (ref >59)

## 2022-04-08 LAB — CBC WITH DIFFERENTIAL/PLATELET
Basophils Absolute: 0.1 10*3/uL (ref 0.0–0.2)
Basos: 1 %
EOS (ABSOLUTE): 0.1 10*3/uL (ref 0.0–0.4)
Eos: 2 %
Hematocrit: 47.5 % — ABNORMAL HIGH (ref 34.0–46.6)
Hemoglobin: 16.2 g/dL — ABNORMAL HIGH (ref 11.1–15.9)
Immature Grans (Abs): 0 10*3/uL (ref 0.0–0.1)
Immature Granulocytes: 0 %
Lymphocytes Absolute: 2.8 10*3/uL (ref 0.7–3.1)
Lymphs: 32 %
MCH: 31.9 pg (ref 26.6–33.0)
MCHC: 34.1 g/dL (ref 31.5–35.7)
MCV: 94 fL (ref 79–97)
Monocytes Absolute: 0.7 10*3/uL (ref 0.1–0.9)
Monocytes: 8 %
Neutrophils Absolute: 4.9 10*3/uL (ref 1.4–7.0)
Neutrophils: 57 %
Platelets: 243 10*3/uL (ref 150–450)
RBC: 5.08 x10E6/uL (ref 3.77–5.28)
RDW: 12.4 % (ref 11.7–15.4)
WBC: 8.5 10*3/uL (ref 3.4–10.8)

## 2022-04-08 LAB — LIPID PANEL
Chol/HDL Ratio: 4.1 ratio (ref 0.0–4.4)
Cholesterol, Total: 189 mg/dL (ref 100–199)
HDL: 46 mg/dL (ref >39)
LDL Chol Calc (NIH): 125 mg/dL — ABNORMAL HIGH (ref 0–99)
Triglycerides: 97 mg/dL (ref 0–149)
VLDL Cholesterol Cal: 18 mg/dL (ref 5–40)

## 2022-04-08 LAB — HEMOGLOBIN A1C
Est. average glucose Bld gHb Est-mCnc: 246 mg/dL
Hgb A1c MFr Bld: 10.2 % — ABNORMAL HIGH (ref 4.8–5.6)

## 2022-04-08 LAB — VITAMIN D 25 HYDROXY (VIT D DEFICIENCY, FRACTURES): Vit D, 25-Hydroxy: 83 ng/mL (ref 30.0–100.0)

## 2022-04-11 ENCOUNTER — Encounter: Payer: Self-pay | Admitting: Family Medicine

## 2022-05-16 ENCOUNTER — Telehealth: Payer: Self-pay | Admitting: *Deleted

## 2022-05-16 ENCOUNTER — Other Ambulatory Visit: Payer: Self-pay | Admitting: Family Medicine

## 2022-05-16 ENCOUNTER — Other Ambulatory Visit: Payer: Self-pay | Admitting: Physician Assistant

## 2022-05-16 DIAGNOSIS — E1159 Type 2 diabetes mellitus with other circulatory complications: Secondary | ICD-10-CM

## 2022-05-16 MED ORDER — ATENOLOL 25 MG PO TABS: 25.0000 mg | ORAL_TABLET | Freq: Every day | ORAL | 1 refills | Status: DC

## 2022-05-16 NOTE — Telephone Encounter (Signed)
Pt calling needing a refill on below,     atenolol (TENORMIN) 25 MG tablet   [facility] - [place], [place] - [place]   Lov 04/06/22  Rov 06/01/22

## 2022-05-16 NOTE — Telephone Encounter (Signed)
Refill of atenolol 25 mg sent in to get her through her next office visit 06/01/2022.

## 2022-05-23 ENCOUNTER — Telehealth: Payer: Self-pay | Admitting: *Deleted

## 2022-05-23 DIAGNOSIS — J3089 Other allergic rhinitis: Secondary | ICD-10-CM

## 2022-05-23 MED ORDER — AZELASTINE HCL 0.1 % NA SOLN: 1.0000 | Freq: Two times a day (BID) | NASAL | 2 refills | Status: DC

## 2022-05-23 NOTE — Telephone Encounter (Signed)
Refill sent.

## 2022-05-23 NOTE — Telephone Encounter (Signed)
Pt calling requesting refill on below.      azelastine (ASTELIN) 0.1 % nasal spray   [facility] - [place], [place] - [address]    LOV 04/06/22  ROV 06/01/22

## 2022-06-01 ENCOUNTER — Encounter: Payer: Self-pay | Admitting: Family Medicine

## 2022-06-01 ENCOUNTER — Ambulatory Visit (INDEPENDENT_AMBULATORY_CARE_PROVIDER_SITE_OTHER): Payer: Medicare Other | Admitting: Family Medicine

## 2022-06-01 VITALS — BP 122/63 | HR 58 | Resp 18 | Ht 64.0 in | Wt 141.0 lb

## 2022-06-01 DIAGNOSIS — E782 Mixed hyperlipidemia: Secondary | ICD-10-CM | POA: Diagnosis not present

## 2022-06-01 DIAGNOSIS — I152 Hypertension secondary to endocrine disorders: Secondary | ICD-10-CM

## 2022-06-01 DIAGNOSIS — D582 Other hemoglobinopathies: Secondary | ICD-10-CM

## 2022-06-01 DIAGNOSIS — E1159 Type 2 diabetes mellitus with other circulatory complications: Secondary | ICD-10-CM | POA: Diagnosis not present

## 2022-06-01 DIAGNOSIS — E1169 Type 2 diabetes mellitus with other specified complication: Secondary | ICD-10-CM

## 2022-06-01 DIAGNOSIS — K219 Gastro-esophageal reflux disease without esophagitis: Secondary | ICD-10-CM

## 2022-06-01 DIAGNOSIS — I10 Essential (primary) hypertension: Secondary | ICD-10-CM

## 2022-06-01 MED ORDER — ATENOLOL 25 MG PO TABS
25.0000 mg | ORAL_TABLET | Freq: Every day | ORAL | 1 refills | Status: DC
Start: 2022-06-01 — End: 2022-10-03

## 2022-06-01 MED ORDER — OMEPRAZOLE 20 MG PO CPDR
DELAYED_RELEASE_CAPSULE | ORAL | 1 refills | Status: DC
Start: 2022-06-01 — End: 2022-12-05

## 2022-06-01 MED ORDER — ATORVASTATIN CALCIUM 10 MG PO TABS
ORAL_TABLET | ORAL | 1 refills | Status: DC
Start: 2022-06-01 — End: 2022-10-03

## 2022-06-01 NOTE — Progress Notes (Signed)
Established Patient Office Visit  Subjective   Patient ID: Dominique Brock, female    DOB: 1956/02/09  Age: 67 y.o. MRN: [id number]  Chief Complaint  Patient presents with   Diabetes    Non Fasting    Hypertension    HPI Dominique Brock is a 67 y.o. female presenting today for follow up of hypertension, diabetes, hyperlipidemia, lab work.  She has been working with a nutritionist which she says has been "eye-opening".  She has made significant changes to her eating habits and fasting glucose levels have gone from the 200s and 300s to consistently around 120 or 130.  She no longer has any polyuria or headache that she was complaining of at her last visit.  Fatigue has also mostly resolved.  She has not been able to implement walking 10 minutes a day 3 times a week on a consistent basis, but she does make an effort to get up and walk across the length of her house once an hour. Hypertension: Patient here for follow-up of elevated blood pressure. She is not exercising and is adherent to low salt diet.   Pt denies chest pain, SOB, dizziness, edema, syncope, fatigue or heart palpitations. Taking atenolol, reports excellent compliance with treatment. Denies side effects. Hyperlipidemia: Currently consuming a low fat diet. The patient does not participate in regular exercise at present.  She is working on implementing a 10-minute walk at least 3 times a week. The 10-year ASCVD risk score (Arnett DK, et al., 2019) is: 13.3% Diabetes: denies hypoglycemic events, wounds or sores that are not healing well, increased thirst or urination.  She met with the nutritionist after our last appointment.  Checking glucose at home, fasting ranges have been as low as 105 and as high as 167 but most consistently in the 120s or 130s.  Lab work: On most recent lab work, A1c at 10.2.  LDL 125 which is above goal of less than 70, LDL has been above goal for more than 2 years now.  Hemoglobin and hematocrit also elevated as  they have been for about 4 years now.  ROS Negative unless otherwise noted in HPI   Objective:     BP 122/63 (BP Location: Right Arm, Patient Position: Sitting, Cuff Size: Normal)   Pulse (!) 58   Resp 18   Ht 5\' 4"  (1.626 m)   Wt 141 lb (64 kg)   SpO2 100%   BMI 24.20 kg/m   Physical Exam Constitutional:      General: She is not in acute distress.    Appearance: Normal appearance.  HENT:     Head: Normocephalic and atraumatic.  Cardiovascular:     Rate and Rhythm: Normal rate and regular rhythm.     Pulses:          Dorsalis pedis pulses are 2+ on the right side and 2+ on the left side.       Posterior tibial pulses are 2+ on the right side and 2+ on the left side.     Heart sounds: No murmur heard.    No friction rub. No gallop.  Pulmonary:     Effort: Pulmonary effort is normal. No respiratory distress.     Breath sounds: No wheezing, rhonchi or rales.  Musculoskeletal:     Right foot: Normal range of motion. No deformity or bunion.     Left foot: Normal range of motion. No deformity or bunion.  Feet:     Right foot:  Protective Sensation: 10 sites tested.  10 sites sensed.     Skin integrity: No ulcer, blister, skin breakdown or erythema.     Toenail Condition: Right toenails are normal.     Left foot:     Protective Sensation: 10 sites tested.  10 sites sensed.     Skin integrity: No ulcer, blister, skin breakdown or erythema.     Toenail Condition: Left toenails are normal.  Skin:    General: Skin is warm and dry.  Neurological:     Mental Status: She is alert and oriented to person, place, and time.  Psychiatric:        Mood and Affect: Mood normal.     Assessment & Plan:  Hypertension, unspecified type Assessment & Plan: BP goal less than 130/82.  Blood pressure slightly above goal on presentation today 135/82, on repeat was 122/63 and at goal.  Continue atenolol 25 mg daily.  Encouraged her to continue working with the nutritionist and implementing  a walking routine as much as she can even if it is starting small with 1 day a week and working up to more frequently.  Patient verbalized understanding and is agreeable with this plan.  Orders: -     CBC with Differential/Platelet; Future -     Comprehensive metabolic panel; Future  Type 2 diabetes mellitus with other specified complication, without long-term current use of insulin Assessment & Plan: A1c on 04/07/2022 was 10.2.  Patient has been meeting with a nutritionist to manage blood glucose levels.  Encouraged her to continue working with the nutritionist as she has successfully decreased fasting glucose levels from 200 to 300 to within the low 100s.  We discussed continuing management with diet and lifestyle, referral to endocrinology, or starting medication as options to continue management.  With her current success, she would like to continue for 1 more month before rechecking A1c.  In 1 month, recheck A1c, CBC, CMP, and lipid panel.  Will also check urine microalbumin as we were unable to obtain a specimen today.  Foot exam completed, referral made to ophthalmologist for diabetic eye exam.  Will continue to monitor.  Orders: -     CBC with Differential/Platelet; Future -     Comprehensive metabolic panel; Future -     Hemoglobin A1c; Future  Mixed diabetic hyperlipidemia associated with type 2 diabetes mellitus Assessment & Plan: Last lipid panel: LDL 125, HDL 46, triglycerides 97.  At last visit, stated that she has not been taking atorvastatin 10 mg consistently.  Working with a nutritionist has been helpful for getting her fasting sugar levels lower, and may also have positive effect on cholesterol levels.  At her lab appointment in a month, we will also repeat lipid panel to assess need for medication or if management with lifestyle alone is adequate.  Orders: -     CBC with Differential/Platelet; Future -     Comprehensive metabolic panel; Future -     Lipid panel;  Future  Elevated hemoglobin Assessment & Plan: Hemoglobin and hematocrit have been consistently elevated for about 4 years now and has not been worked up in the past.  Starting with CBC, peripheral blood smear, and serum EPO.  Liver and kidney function within normal limits on last check.  Orders: -     Pathologist smear review; Future -     CBC with Differential/Platelet; Future -     Erythropoietin; Future    Return in about 4 weeks (around 06/29/2022) for follow-up  for hypertension, hyperlipidemia, diabetes; fasting blood work 1 week before.    [name], PA

## 2022-06-01 NOTE — Assessment & Plan Note (Signed)
Last lipid panel: LDL 125, HDL 46, triglycerides 97.  At last visit, stated that she has not been taking atorvastatin 10 mg consistently.  Working with a nutritionist has been helpful for getting her fasting sugar levels lower, and may also have positive effect on cholesterol levels.  At her lab appointment in a month, we will also repeat lipid panel to assess need for medication or if management with lifestyle alone is adequate.

## 2022-06-01 NOTE — Assessment & Plan Note (Addendum)
BP goal less than 130/82.  Blood pressure slightly above goal on presentation today 135/82, on repeat was 122/63 and at goal.  Continue atenolol 25 mg daily.  Encouraged her to continue working with the nutritionist and implementing a walking routine as much as she can even if it is starting small with 1 day a week and working up to more frequently.  Patient verbalized understanding and is agreeable with this plan.

## 2022-06-01 NOTE — Patient Instructions (Signed)
Keep up the amazing work!

## 2022-06-01 NOTE — Addendum Note (Signed)
Addended by: Tonny Bollman on: 06/01/2022 11:33 AM   Modules accepted: Orders

## 2022-06-01 NOTE — Assessment & Plan Note (Signed)
A1c on 04/07/2022 was 10.2.  Patient has been meeting with a nutritionist to manage blood glucose levels.  Encouraged her to continue working with the nutritionist as she has successfully decreased fasting glucose levels from 200 to 300 to within the low 100s.  We discussed continuing management with diet and lifestyle, referral to endocrinology, or starting medication as options to continue management.  With her current success, she would like to continue for 1 more month before rechecking A1c.  In 1 month, recheck A1c, CBC, CMP, and lipid panel.  Will also check urine microalbumin as we were unable to obtain a specimen today.  Foot exam completed, referral made to ophthalmologist for diabetic eye exam.  Will continue to monitor.

## 2022-06-01 NOTE — Assessment & Plan Note (Signed)
Hemoglobin and hematocrit have been consistently elevated for about 4 years now and has not been worked up in the past.  Starting with CBC, peripheral blood smear, and serum EPO.  Liver and kidney function within normal limits on last check.

## 2022-06-02 LAB — PATHOLOGIST SMEAR REVIEW
Basophils Absolute: 0.1 10*3/uL (ref 0.0–0.2)
Basos: 1 %
EOS (ABSOLUTE): 0.6 10*3/uL — ABNORMAL HIGH (ref 0.0–0.4)
Hematocrit: 49.9 % — ABNORMAL HIGH (ref 34.0–46.6)
Lymphs: 26 %
MCH: 31.8 pg (ref 26.6–33.0)
Monocytes Absolute: 0.8 10*3/uL (ref 0.1–0.9)
Monocytes: 8 %
RBC: 5.22 x10E6/uL (ref 3.77–5.28)
RDW: 12.7 % (ref 11.7–15.4)
WBC: 10.3 10*3/uL (ref 3.4–10.8)

## 2022-06-02 LAB — CBC WITH DIFFERENTIAL/PLATELET
Basophils Absolute: 0.1 10*3/uL (ref 0.0–0.2)
EOS (ABSOLUTE): 0.6 10*3/uL — ABNORMAL HIGH (ref 0.0–0.4)
Eos: 6 %
Hemoglobin: 16.4 g/dL — ABNORMAL HIGH (ref 11.1–15.9)
Immature Granulocytes: 1 %
MCHC: 33.1 g/dL (ref 31.5–35.7)
Neutrophils Absolute: 6 10*3/uL (ref 1.4–7.0)
WBC: 10.1 10*3/uL (ref 3.4–10.8)

## 2022-06-02 LAB — ERYTHROPOIETIN

## 2022-06-03 LAB — CBC WITH DIFFERENTIAL/PLATELET
Basos: 1 %
Hematocrit: 49.6 % — ABNORMAL HIGH (ref 34.0–46.6)
Immature Grans (Abs): 0.1 10*3/uL (ref 0.0–0.1)
Lymphocytes Absolute: 2.6 10*3/uL (ref 0.7–3.1)
Lymphs: 25 %
MCH: 31.9 pg (ref 26.6–33.0)
MCV: 97 fL (ref 79–97)
Monocytes Absolute: 0.8 10*3/uL (ref 0.1–0.9)
Monocytes: 8 %
Neutrophils: 59 %
Platelets: 235 10*3/uL (ref 150–450)
RBC: 5.14 x10E6/uL (ref 3.77–5.28)
RDW: 12.8 % (ref 11.7–15.4)

## 2022-06-03 LAB — PATHOLOGIST SMEAR REVIEW
Eos: 6 %
Hemoglobin: 16.6 g/dL — ABNORMAL HIGH (ref 11.1–15.9)
Immature Grans (Abs): 0.1 10*3/uL (ref 0.0–0.1)
Immature Granulocytes: 1 %
Lymphocytes Absolute: 2.7 10*3/uL (ref 0.7–3.1)
MCHC: 33.3 g/dL (ref 31.5–35.7)
MCV: 96 fL (ref 79–97)
Neutrophils Absolute: 6 10*3/uL (ref 1.4–7.0)
Neutrophils: 58 %
Platelets: 230 10*3/uL (ref 150–450)

## 2022-06-22 ENCOUNTER — Other Ambulatory Visit: Payer: Medicare Other

## 2022-06-22 DIAGNOSIS — I10 Essential (primary) hypertension: Secondary | ICD-10-CM

## 2022-06-22 DIAGNOSIS — E1169 Type 2 diabetes mellitus with other specified complication: Secondary | ICD-10-CM

## 2022-06-23 LAB — CBC WITH DIFFERENTIAL/PLATELET
Basophils Absolute: 0.1 10*3/uL (ref 0.0–0.2)
Basos: 1 %
EOS (ABSOLUTE): 0.4 10*3/uL (ref 0.0–0.4)
Eos: 5 %
Hematocrit: 47.9 % — ABNORMAL HIGH (ref 34.0–46.6)
Hemoglobin: 15.9 g/dL (ref 11.1–15.9)
Immature Grans (Abs): 0 10*3/uL (ref 0.0–0.1)
Immature Granulocytes: 0 %
Lymphocytes Absolute: 2.3 10*3/uL (ref 0.7–3.1)
Lymphs: 28 %
MCH: 31.8 pg (ref 26.6–33.0)
MCHC: 33.2 g/dL (ref 31.5–35.7)
MCV: 96 fL (ref 79–97)
Monocytes Absolute: 0.7 10*3/uL (ref 0.1–0.9)
Monocytes: 9 %
Neutrophils Absolute: 4.8 10*3/uL (ref 1.4–7.0)
Neutrophils: 57 %
Platelets: 200 10*3/uL (ref 150–450)
RBC: 5 x10E6/uL (ref 3.77–5.28)
RDW: 12.8 % (ref 11.7–15.4)
WBC: 8.2 10*3/uL (ref 3.4–10.8)

## 2022-06-23 LAB — LIPID PANEL
Chol/HDL Ratio: 3.3 ratio (ref 0.0–4.4)
Cholesterol, Total: 121 mg/dL (ref 100–199)
HDL: 37 mg/dL — ABNORMAL LOW (ref >39)
LDL Chol Calc (NIH): 70 mg/dL (ref 0–99)
Triglycerides: 66 mg/dL (ref 0–149)
VLDL Cholesterol Cal: 14 mg/dL (ref 5–40)

## 2022-06-23 LAB — COMPREHENSIVE METABOLIC PANEL
ALT: 27 IU/L (ref 0–32)
AST: 23 IU/L (ref 0–40)
Albumin/Globulin Ratio: 1.7 (ref 1.2–2.2)
Albumin: 4.1 g/dL (ref 3.9–4.9)
Alkaline Phosphatase: 63 IU/L (ref 44–121)
BUN/Creatinine Ratio: 19 (ref 12–28)
BUN: 17 mg/dL (ref 8–27)
Bilirubin Total: 0.5 mg/dL (ref 0.0–1.2)
CO2: 21 mmol/L (ref 20–29)
Calcium: 9.2 mg/dL (ref 8.7–10.3)
Chloride: 102 mmol/L (ref 96–106)
Creatinine, Ser: 0.9 mg/dL (ref 0.57–1.00)
Globulin, Total: 2.4 g/dL (ref 1.5–4.5)
Glucose: 111 mg/dL — ABNORMAL HIGH (ref 70–99)
Potassium: 4.3 mmol/L (ref 3.5–5.2)
Sodium: 138 mmol/L (ref 134–144)
Total Protein: 6.5 g/dL (ref 6.0–8.5)
eGFR: 71 mL/min/{1.73_m2} (ref >59)

## 2022-06-23 LAB — HEMOGLOBIN A1C
Est. average glucose Bld gHb Est-mCnc: 174 mg/dL
Hgb A1c MFr Bld: 7.7 % — ABNORMAL HIGH (ref 4.8–5.6)

## 2022-06-29 ENCOUNTER — Encounter: Payer: Self-pay | Admitting: Family Medicine

## 2022-06-29 ENCOUNTER — Ambulatory Visit (INDEPENDENT_AMBULATORY_CARE_PROVIDER_SITE_OTHER): Payer: Medicare Other | Admitting: Family Medicine

## 2022-06-29 VITALS — BP 119/78 | HR 72 | Ht 64.0 in | Wt 140.4 lb

## 2022-06-29 DIAGNOSIS — I10 Essential (primary) hypertension: Secondary | ICD-10-CM | POA: Diagnosis not present

## 2022-06-29 DIAGNOSIS — E782 Mixed hyperlipidemia: Secondary | ICD-10-CM

## 2022-06-29 DIAGNOSIS — E1169 Type 2 diabetes mellitus with other specified complication: Secondary | ICD-10-CM

## 2022-06-29 NOTE — Patient Instructions (Signed)
Keep up the great work! I am very proud of the progress you've made.  Get your updated tetanus, Shingles, and PCV20 pneumonia vaccines at the pharmacy.

## 2022-06-29 NOTE — Assessment & Plan Note (Signed)
A1c has decreased significantly to 7.7.  Encourage patient to continue working with nutritionist on diet and exercise goals.  Will recheck A1c again in 3 months, if below goal can continue managing with lifestyle interventions.  Otherwise considering metformin 500 mg once daily.  Unable to collect urine sample today, need to check urine microalbumin at next appointment.

## 2022-06-29 NOTE — Progress Notes (Signed)
Established Patient Office Visit  Subjective   Patient ID: Dominique Brock, female    DOB: 03/02/1955  Age: 67 y.o. MRN: [id number]  Chief Complaint  Patient presents with   Medical Management of Chronic Issues    HPI Dominique Brock is a 67 y.o. female presenting today for follow up of hypertension, hyperlipidemia, diabetes. Hypertension: Patient here for follow-up of elevated blood pressure. She is exercising and is adherent to low salt diet.  She is working with a nutritionist to make dietary changes and implement a walking routine.  Pt denies chest pain, SOB, dizziness, edema, syncope, fatigue or heart palpitations. Taking atenolol 25 mg daily, reports excellent compliance with treatment. Denies side effects. Hyperlipidemia: tolerating atorvastatin well with no myalgias or significant side effects. Currently consuming a  low-carb low-fat high-protein  diet. Home exercise routine includes walking.  LDL has improved from 125 to 70, triglycerides 97 to 66.  HDL it has decreased to 37. The ASCVD Risk score (Arnett DK, et al., 2019) failed to calculate for the following reasons:   The valid total cholesterol range is 130 to 320 mg/dL Diabetes: She has been working with a nutritionist for several months now to manage blood glucose levels.  In February A1c was 10.2, on most recent check it has decreased to 7.7.  Denies hypoglycemic events, wounds or sores that are not healing well, increased thirst or urination. Denies vision problems, eye exam due.  She needs a referral to an ophthalmologist as she has been having trouble finding an office to get scheduled with medic eye exam.  ROS Negative unless otherwise noted in HPI   Objective:     BP 119/78   Pulse 72   Ht 5\' 4"  (1.626 m)   Wt 140 lb 6.4 oz (63.7 kg)   SpO2 99%   BMI 24.10 kg/m   Physical Exam Constitutional:      General: She is not in acute distress.    Appearance: Normal appearance.  HENT:     Head: Normocephalic and  atraumatic.  Cardiovascular:     Rate and Rhythm: Normal rate and regular rhythm.     Pulses: Normal pulses.     Heart sounds: No murmur heard.    No friction rub. No gallop.  Pulmonary:     Effort: Pulmonary effort is normal. No respiratory distress.     Breath sounds: No wheezing, rhonchi or rales.  Skin:    General: Skin is warm and dry.  Neurological:     Mental Status: She is alert and oriented to person, place, and time.     Assessment & Plan:  Hypertension, unspecified type Assessment & Plan: BP goal <130/80.  Blood pressure at goal 119/78.  Continue atenolol 25 mg daily.  Continue working to find ways to implement movement and physical activity.  Will continue to monitor.   Type 2 diabetes mellitus with other specified complication, without long-term current use of insulin ([facility]) Assessment & Plan: A1c has decreased significantly to 7.7.  Encourage patient to continue working with nutritionist on diet and exercise goals.  Will recheck A1c again in 3 months, if below goal can continue managing with lifestyle interventions.  Otherwise considering metformin 500 mg once daily.  Unable to collect urine sample today, need to check urine microalbumin at next appointment.  Orders: -     Ambulatory referral to Ophthalmology  Mixed diabetic hyperlipidemia associated with type 2 diabetes mellitus ([facility]) Assessment & Plan: Last lipid panel: LDL 70, HDL  37, triglycerides 66.  Continue atorvastatin 10 mg daily and working with nutritionist.  In the future, if LDL is below goal of 70, may consider decreasing or stopping atorvastatin if able to control with lifestyle alone.   We discussed that she is also due for her tetanus booster, second shingles shot, and PCV 20 pneumonia shot.  Patient is planning to go to [facility] to have vaccines updated.  Return in about 3 months (around 09/29/2022) for follow-up for HTN, HLD, DM, fasting blood work 1 week before.  She will also need to schedule her  welcome to [employer] annual wellness visit sometime in the next 6 months.   [name], PA

## 2022-06-29 NOTE — Assessment & Plan Note (Signed)
BP goal <130/80.  Blood pressure at goal 119/78.  Continue atenolol 25 mg daily.  Continue working to find ways to implement movement and physical activity.  Will continue to monitor.

## 2022-06-29 NOTE — Assessment & Plan Note (Signed)
Last lipid panel: LDL 70, HDL 37, triglycerides 66.  Continue atorvastatin 10 mg daily and working with nutritionist.  In the future, if LDL is below goal of 70, may consider decreasing or stopping atorvastatin if able to control with lifestyle alone.

## 2022-08-19 ENCOUNTER — Other Ambulatory Visit: Payer: Self-pay | Admitting: Family Medicine

## 2022-08-19 DIAGNOSIS — J3089 Other allergic rhinitis: Secondary | ICD-10-CM

## 2022-09-11 LAB — HM DIABETES EYE EXAM

## 2022-09-13 ENCOUNTER — Encounter: Payer: Self-pay | Admitting: Family Medicine

## 2022-09-16 ENCOUNTER — Other Ambulatory Visit: Payer: Self-pay | Admitting: Family Medicine

## 2022-09-16 DIAGNOSIS — E039 Hypothyroidism, unspecified: Secondary | ICD-10-CM

## 2022-09-16 DIAGNOSIS — D649 Anemia, unspecified: Secondary | ICD-10-CM

## 2022-09-16 DIAGNOSIS — I10 Essential (primary) hypertension: Secondary | ICD-10-CM

## 2022-09-16 DIAGNOSIS — Z0000 Encounter for general adult medical examination without abnormal findings: Secondary | ICD-10-CM

## 2022-09-16 DIAGNOSIS — E1169 Type 2 diabetes mellitus with other specified complication: Secondary | ICD-10-CM

## 2022-09-27 ENCOUNTER — Other Ambulatory Visit: Payer: Medicare Other

## 2022-09-27 DIAGNOSIS — E1169 Type 2 diabetes mellitus with other specified complication: Secondary | ICD-10-CM

## 2022-09-27 DIAGNOSIS — D649 Anemia, unspecified: Secondary | ICD-10-CM

## 2022-09-27 DIAGNOSIS — E039 Hypothyroidism, unspecified: Secondary | ICD-10-CM

## 2022-09-27 DIAGNOSIS — Z0000 Encounter for general adult medical examination without abnormal findings: Secondary | ICD-10-CM

## 2022-09-27 DIAGNOSIS — I10 Essential (primary) hypertension: Secondary | ICD-10-CM

## 2022-10-02 ENCOUNTER — Other Ambulatory Visit: Payer: Self-pay | Admitting: Family Medicine

## 2022-10-02 DIAGNOSIS — E1159 Type 2 diabetes mellitus with other circulatory complications: Secondary | ICD-10-CM

## 2022-10-03 ENCOUNTER — Ambulatory Visit (INDEPENDENT_AMBULATORY_CARE_PROVIDER_SITE_OTHER): Payer: Medicare Other | Admitting: Family Medicine

## 2022-10-03 ENCOUNTER — Encounter: Payer: Self-pay | Admitting: Family Medicine

## 2022-10-03 VITALS — BP 148/81 | HR 66 | Resp 18 | Ht 64.0 in | Wt 142.0 lb

## 2022-10-03 DIAGNOSIS — E1169 Type 2 diabetes mellitus with other specified complication: Secondary | ICD-10-CM

## 2022-10-03 DIAGNOSIS — E1159 Type 2 diabetes mellitus with other circulatory complications: Secondary | ICD-10-CM

## 2022-10-03 DIAGNOSIS — E782 Mixed hyperlipidemia: Secondary | ICD-10-CM | POA: Diagnosis not present

## 2022-10-03 DIAGNOSIS — M5412 Radiculopathy, cervical region: Secondary | ICD-10-CM | POA: Diagnosis not present

## 2022-10-03 DIAGNOSIS — F40243 Fear of flying: Secondary | ICD-10-CM | POA: Diagnosis not present

## 2022-10-03 DIAGNOSIS — I152 Hypertension secondary to endocrine disorders: Secondary | ICD-10-CM

## 2022-10-03 LAB — POCT UA - MICROALBUMIN
Creatinine, POC: 10 mg/dL
Microalbumin Ur, POC: 10 mg/L

## 2022-10-03 MED ORDER — DIAZEPAM 2 MG PO TABS: 2.0000 mg | ORAL_TABLET | Freq: Four times a day (QID) | ORAL | 0 refills | Status: DC | PRN

## 2022-10-03 MED ORDER — PREGABALIN 25 MG PO CAPS
25.0000 mg | ORAL_CAPSULE | Freq: Three times a day (TID) | ORAL | 0 refills | Status: DC | PRN
Start: 2022-10-03 — End: 2023-10-31

## 2022-10-03 MED ORDER — ATENOLOL 25 MG PO TABS
25.0000 mg | ORAL_TABLET | Freq: Every day | ORAL | 1 refills | Status: DC
Start: 2022-10-03 — End: 2023-06-26

## 2022-10-03 MED ORDER — ATORVASTATIN CALCIUM 10 MG PO TABS
ORAL_TABLET | ORAL | 1 refills | Status: DC
Start: 2022-10-03 — End: 2023-05-17

## 2022-10-03 NOTE — Assessment & Plan Note (Signed)
Last lipid panel: LDL 51, HDL 36, triglycerides 94.  Continue atorvastatin 10 mg daily and working with nutritionist.  In the future, if LDL stays below goal of 70, may consider decreasing or stopping atorvastatin if able to control with lifestyle alone.
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
## 2022-10-03 NOTE — Assessment & Plan Note (Signed)
BP goal <130/80.  Stable, at goal. Continue atenolol 25 mg daily.  Continue working to find ways to implement movement and physical activity.  Will continue to monitor.

## 2022-10-03 NOTE — Assessment & Plan Note (Signed)
A1c decreased to 6.6 from 7.7.  Continue working with nutritionist on diet and exercise goals.  Recheck A1c again in 3 months, if still below goal continue managing with lifestyle interventions.  Urine microalbumin slightly elevated likely secondary to long-term hyperglycemia prior to good control, will continue to monitor.

## 2022-10-26 LAB — HM MAMMOGRAPHY

## 2022-10-26 LAB — HM PAP SMEAR
HM Pap smear: NEGATIVE
HPV, high-risk: NEGATIVE

## 2022-11-01 ENCOUNTER — Encounter: Payer: Self-pay | Admitting: Family Medicine

## 2022-11-30 ENCOUNTER — Other Ambulatory Visit: Payer: Self-pay | Admitting: Family Medicine

## 2022-11-30 ENCOUNTER — Telehealth: Payer: Self-pay

## 2022-11-30 DIAGNOSIS — T7800XD Anaphylactic reaction due to unspecified food, subsequent encounter: Secondary | ICD-10-CM

## 2022-11-30 MED ORDER — EPINEPHRINE 0.3 MG/0.3ML IJ SOAJ
0.3000 mg | INTRAMUSCULAR | 0 refills | Status: AC | PRN
Start: 2022-11-30 — End: ?

## 2022-11-30 MED ORDER — EPINEPHRINE 0.3 MG/0.3ML IJ SOAJ
0.3000 mg | Freq: Once | INTRAMUSCULAR | 0 refills | Status: AC
Start: 2022-11-30 — End: 2022-11-30

## 2022-11-30 NOTE — Telephone Encounter (Signed)
I refilled her prescription for EpiPen, sent to [facility] in [place]

## 2022-11-30 NOTE — Telephone Encounter (Signed)
Patient called office to see if she could get an EPIPEN before she goes out of town, patient states that she is allergic to shellfish, I advised patient that she may have to be seen, please advise, thanks.

## 2022-12-04 ENCOUNTER — Other Ambulatory Visit: Payer: Self-pay | Admitting: Family Medicine

## 2022-12-04 DIAGNOSIS — K219 Gastro-esophageal reflux disease without esophagitis: Secondary | ICD-10-CM

## 2022-12-30 ENCOUNTER — Ambulatory Visit (INDEPENDENT_AMBULATORY_CARE_PROVIDER_SITE_OTHER): Payer: Medicare Other | Admitting: Family Medicine

## 2022-12-30 ENCOUNTER — Encounter: Payer: Self-pay | Admitting: Family Medicine

## 2022-12-30 VITALS — BP 122/66 | HR 59 | Resp 18 | Ht 64.0 in | Wt 144.0 lb

## 2022-12-30 DIAGNOSIS — Z0000 Encounter for general adult medical examination without abnormal findings: Secondary | ICD-10-CM | POA: Diagnosis not present

## 2022-12-30 NOTE — Progress Notes (Signed)
Subjective:    Dominique Brock is a 67 y.o. female who presents for a Welcome to Medicare exam.   Cardiac Risk Factors include: none      Objective:    Today's Vitals   12/30/22 0935 12/30/22 0938  BP: 122/66   Pulse: (!) 59   Resp: 18   SpO2: 100%   Weight: 144 lb (65.3 kg)   Height: 5\' 4"  (1.626 m)   PainSc: 0-No pain 0-No pain  Body mass index is 24.72 kg/m.  Medications Outpatient Encounter Medications as of 12/30/2022  Medication Sig   atenolol (TENORMIN) 25 MG tablet Take 1 tablet (25 mg total) by mouth daily.   atorvastatin (LIPITOR) 10 MG tablet TAKE 1 TABLET (10 MG TOTAL) BY MOUTH DAILY. **CALL TO SCHEDULE A FOLLOW UP FOR FUTURE MED REFILLS**   Azelastine HCl 137 MCG/SPRAY SOLN PLACE 1 SPRAY INTO BOTH NOSTRILS 2 (TWO) TIMES DAILY. USE IN EACH NOSTRIL AS DIRECTED   cholecalciferol (VITAMIN D3) 25 MCG (1000 UT) tablet Take 1,000 Units by mouth daily.   Continuous Blood Gluc Receiver (DEXCOM G6 RECEIVER) DEVI Use as directed.   Continuous Blood Gluc Sensor (DEXCOM G6 SENSOR) MISC Use as directed.   Continuous Blood Gluc Transmit (DEXCOM G6 TRANSMITTER) MISC Used as directed.   diazepam (VALIUM) 2 MG tablet Take 1 tablet (2 mg total) by mouth every 6 (six) hours as needed for anxiety. While flying   EPINEPHrine 0.3 mg/0.3 mL IJ SOAJ injection Inject 0.3 mg into the muscle as needed for anaphylaxis.   fluticasone (FLONASE) 50 MCG/ACT nasal spray USE 2 SPRAY(S) IN EACH NOSTRIL ONCE DAILY   levothyroxine (SYNTHROID, LEVOTHROID) 100 MCG tablet Patient states she is taking 1/2 tablet once daily   Multiple Vitamin (MULTIVITAMIN) tablet Take 1 tablet by mouth daily.   omeprazole (PRILOSEC) 20 MG capsule TAKE 1 CAPSULE BY MOUTH EVERY DAY   pregabalin (LYRICA) 25 MG capsule Take 1 capsule (25 mg total) by mouth 3 (three) times daily as needed.   progesterone 200 MG SUPP Place 100 mg vaginally at bedtime.   spironolactone (ALDACTONE) 25 MG tablet Take 25 mg by mouth daily.    [DISCONTINUED] imipramine (TOFRANIL) 25 MG tablet Take 1 tablet (25 mg total) by mouth at bedtime.   [DISCONTINUED] progesterone (PROMETRIUM) 100 MG capsule Take 100 mg by mouth daily.     No facility-administered encounter medications on file as of 12/30/2022.     History: Past Medical History:  Diagnosis Date   Asthma    Diabetes mellitus    prediabetic diet controlled   Eczema 06/15/2016   Hypertension    Hypothyroidism    Sleep apnea    only with allergies   Thyroid disease    Past Surgical History:  Procedure Laterality Date   ABDOMINAL HYSTERECTOMY     BILATERAL OOPHORECTOMY     EXCISION MASS UPPER EXTREMETIES Left 04/18/2019   Procedure: Excision of left arm mass / lipoma;  Surgeon: Peggye Form, DO;  Location: [facility];  Service: Plastics;  Laterality: Left;   SINUS EXPLORATION  1996    Family History  Problem Relation Age of Onset   Hypertension Mother    Hyperlipidemia Mother    Liver cancer Mother 68       died in 2012/01/20, at age 67 or 14.   Allergies Mother    Diabetes Mother 83       onset "mid fifties."   Healthy Father    Dementia Father 64  not officialy diagnosed   Healthy Other        2 siblings   Asthma Maternal Grandmother    Diabetes Maternal Grandmother    Asthma Cousin    Allergic rhinitis Neg Hx    Angioedema Neg Hx    Eczema Neg Hx    Immunodeficiency Neg Hx    Urticaria Neg Hx    Social History   Occupational History   Not on file  Tobacco Use   Smoking status: Never    Passive exposure: Never   Smokeless tobacco: Never  Vaping Use   Vaping status: Never Used  Substance and Sexual Activity   Alcohol use: Yes    Comment: less than once a month   Drug use: No   Sexual activity: Yes    Tobacco Counseling Counseling given: Not Answered   Immunizations and Health Maintenance Immunization History  Administered Date(s) Administered   Influenza Split 12/16/2011, 11/14/2012   Influenza Whole 11/04/2010    Influenza,inj,Quad PF,6+ Mos 12/15/2013, 11/19/2014   Influenza-Unspecified 11/24/2018   Janssen (J&J) SARS-COV-2 Vaccination 05/04/2019, 12/14/2019   Tdap 05/21/2011   Zoster Recombinant(Shingrix) 05/10/2020   Zoster, Live 12/15/2012   Health Maintenance Due  Topic Date Due   HIV Screening  Never done   Hepatitis C Screening  Never done   Zoster Vaccines- Shingrix (2 of 2) 07/05/2020   DTaP/Tdap/Td (2 - Td or Tdap) 05/20/2021   Pneumonia Vaccine 31+ Years old (1 of 1 - PCV) Never done   INFLUENZA VACCINE  09/15/2022   COVID-19 Vaccine (3 - 2023-24 season) 10/16/2022    Activities of Daily Living    12/30/2022    9:41 AM  In your present state of health, do you have any difficulty performing the following activities:  Hearing? 0  Vision? 0  Difficulty concentrating or making decisions? 0  Walking or climbing stairs? 0  Dressing or bathing? 0  Doing errands, shopping? 0  Preparing Food and eating ? N  Using the Toilet? N  In the past six months, have you accidently leaked urine? Y  Do you have problems with loss of bowel control? N  Managing your Medications? N  Managing your Finances? N  Housekeeping or managing your Housekeeping? N    Physical Exam   Physical Exam (optional), or other factors deemed appropriate based on the beneficiary's medical and social history and current clinical standards.   Advanced Directives: Does Patient Have a Medical Advance Directive?: No Would patient like information on creating a medical advance directive?: No - Patient declined  EKG:  unchanged from previous tracings, sinus bradycardia      Assessment:    This is a routine wellness examination for this patient .   Vision/Hearing screen Hearing Screening - Comments:: Not examined  Vision Screening - Comments:: Supervised by [facility]   Goals       Establish Advance Directive (pt-stated)      Patient states she will work on establishing advance directives.         Depression Screen    12/30/2022    9:37 AM 06/29/2022    1:30 PM 06/01/2022    9:35 AM 04/06/2022    4:31 PM  PHQ 2/9 Scores  PHQ - 2 Score 0 0 0 0  PHQ- 9 Score  0 1 8     Fall Risk    12/30/2022    9:41 AM  Fall Risk   Falls in the past year? 0  Number falls in  past yr: 0  Injury with Fall? 0  Risk for fall due to : No Fall Risks  Follow up Falls evaluation completed    Cognitive Function:        12/30/2022    9:41 AM  6CIT Screen  What Year? 0 points  What month? 0 points  What time? 0 points  Count back from 20 0 points  Months in reverse 0 points  Repeat phrase 0 points  Total Score 0 points    Patient Care Team: [name], PA as PCP - General (Family Medicine) Bobbitt, Heywood Iles, MD as Consulting Physician (Allergy and Immunology) Freddy Finner, MD (Inactive) as Consulting Physician (Obstetrics and Gynecology) Carman Ching, OD (Optometry)     Plan:     I have personally reviewed and noted the following in the patient's chart:   Medical and social history Use of alcohol, tobacco or illicit drugs  Current medications and supplements Functional ability and status Nutritional status Physical activity Advanced directives List of other physicians Hospitalizations, surgeries, and ER visits in previous 12 months Vitals Screenings to include cognitive, depression, and falls Referrals and appointments  In addition, I have reviewed and discussed with patient certain preventive protocols, quality metrics, and best practice recommendations. A written personalized care plan for preventive services as well as general preventive health recommendations were provided to patient.     [name], PA 12/30/2022

## 2023-03-23 ENCOUNTER — Encounter: Payer: Self-pay | Admitting: Family Medicine

## 2023-04-09 ENCOUNTER — Ambulatory Visit
Admission: EM | Admit: 2023-04-09 | Discharge: 2023-04-09 | Disposition: A | Discharge status: Home / Self Care | Payer: Medicare Other | Attending: Family Medicine | Admitting: Family Medicine

## 2023-04-09 ENCOUNTER — Other Ambulatory Visit: Payer: Self-pay

## 2023-04-09 DIAGNOSIS — B349 Viral infection, unspecified: Secondary | ICD-10-CM

## 2023-04-09 MED ORDER — DOXYCYCLINE HYCLATE 100 MG PO CAPS: 100.0000 mg | ORAL_CAPSULE | Freq: Two times a day (BID) | ORAL | 0 refills | Status: AC

## 2023-04-09 NOTE — ED Triage Notes (Addendum)
 Pt presents with complaints of cough, chills, congestion, pressure in face/head, left ear pain, and sore throat x 1 week. At home COVID + Flu test taken yesterday and it was negative. Sudafed & prescribed nasal spray taken with no relief or improvement in symptoms. Pt currently rates her overall pain a 3/10. Denies fevers at home.

## 2023-04-09 NOTE — ED Provider Notes (Signed)
 [facility]    CSN: [id number] Arrival date & time: 04/09/23  1018      History   Chief Complaint Chief Complaint  Patient presents with   Sinus Pressure    Otalgia    HPI Dominique Brock is a 68 y.o. female.   The history is provided by the patient.  Otalgia Associated symptoms: congestion, cough and rhinorrhea   Associated symptoms: no diarrhea, no fever, no sore throat and no vomiting   Not feeling well for 1 week symptoms include rhinorrhea which is mostly clear occasionally blood-tinged, nasal congestion, slight nonproductive cough, chills but no documented fever now having sinus pressure and left ear pain.  Home COVID and flu test yesterday was negative.  Taking over-the-counter Sudafed and some nasal spray without relief.  Works from home.  Denies household contacts with illness or recent travel.  Past Medical History:  Diagnosis Date   Asthma    Diabetes mellitus    prediabetic diet controlled   Eczema 06/15/2016   Hypertension    Hypothyroidism    Sleep apnea    only with allergies   Thyroid disease     Patient Active Problem List   Diagnosis Date Noted   Elevated hemoglobin (HCC) 06/01/2022   Irritable bowel syndrome 04/06/2022   Acne 09/30/2020   Body mass index (BMI) 23.0-23.9, adult 09/30/2020   Chronic fatigue syndrome 09/30/2020   Hot flashes 09/30/2020   Low libido 09/30/2020   Menopause 09/30/2020   Primary insomnia 09/30/2020   Vaginal dryness 09/30/2020   Atypical squamous cells of undetermined significance on cytologic smear of cervix (ASC-US) 08/19/2020   Type 2 diabetes mellitus (HCC) 03/01/2019   Mixed diabetic hyperlipidemia associated with type 2 diabetes mellitus (HCC) 03/01/2019   Vitamin D deficiency 03/01/2019   Gastroesophageal reflux disease 01/15/2018   Perennial allergic rhinitis 09/19/2016   Rhinitis medicamentosa 09/19/2016   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   Environmental and seasonal allergies  06/15/2016   Anemia 06/15/2016   Eczema 06/15/2016   Hypertension associated with diabetes (HCC) 06/15/2016   Hypothyroidism 02/05/2016   Chronic sinusitis 02/29/2008   Lipoma of left axilla 06/12/2007   Moderate persistent asthma 02/17/2007   Obstructive sleep apnea, hx of 02/17/2007    Past Surgical History:  Procedure Laterality Date   ABDOMINAL HYSTERECTOMY     BILATERAL OOPHORECTOMY     EXCISION MASS UPPER EXTREMETIES Left 04/18/2019   Procedure: Excision of left arm mass / lipoma;  Surgeon: Peggye Form, DO;  Location: [facility];  Service: Plastics;  Laterality: Left;   SINUS EXPLORATION  1996    OB History   No obstetric history on file.      Home Medications    Prior to Admission medications   Medication Sig Start Date End Date Taking? Authorizing Provider  atenolol (TENORMIN) 25 MG tablet Take 1 tablet (25 mg total) by mouth daily. 10/03/22   [name], PA  atorvastatin (LIPITOR) 10 MG tablet TAKE 1 TABLET (10 MG TOTAL) BY MOUTH DAILY. **CALL TO SCHEDULE A FOLLOW UP FOR FUTURE MED REFILLS** 10/03/22   Saralyn Pilar A, PA  Azelastine HCl 137 MCG/SPRAY SOLN PLACE 1 SPRAY INTO BOTH NOSTRILS 2 (TWO) TIMES DAILY. USE IN EACH NOSTRIL AS DIRECTED 08/19/22   Saralyn Pilar A, PA  cholecalciferol (VITAMIN D3) 25 MCG (1000 UT) tablet Take 1,000 Units by mouth daily.    [provider]  Continuous Blood Gluc Receiver (DEXCOM G6 RECEIVER) DEVI  Use as directed. 07/28/21   Mayer Masker, PA-C  Continuous Blood Gluc Sensor (DEXCOM G6 SENSOR) MISC Use as directed. 07/28/21   Mayer Masker, PA-C  Continuous Blood Gluc Transmit (DEXCOM G6 TRANSMITTER) MISC Used as directed. 07/28/21   Abonza, Kandis Cocking, PA-C  diazepam (VALIUM) 2 MG tablet Take 1 tablet (2 mg total) by mouth every 6 (six) hours as needed for anxiety. While flying 10/03/22   Saralyn Pilar A, PA  EPINEPHrine 0.3 mg/0.3 mL IJ SOAJ injection Inject 0.3 mg into the muscle as needed for  anaphylaxis. 11/30/22   [name], PA  fluticasone (FLONASE) 50 MCG/ACT nasal spray USE 2 SPRAY(S) IN EACH NOSTRIL ONCE DAILY 12/01/21   Mayer Masker, PA-C  levothyroxine (SYNTHROID, LEVOTHROID) 100 MCG tablet Patient states she is taking 1/2 tablet once daily 10/19/17   [provider]  Multiple Vitamin (MULTIVITAMIN) tablet Take 1 tablet by mouth daily.    [provider]  omeprazole (PRILOSEC) 20 MG capsule TAKE 1 CAPSULE BY MOUTH EVERY DAY 12/05/22   Saralyn Pilar A, PA  pregabalin (LYRICA) 25 MG capsule Take 1 capsule (25 mg total) by mouth 3 (three) times daily as needed. 10/03/22   Saralyn Pilar A, PA  progesterone 200 MG SUPP Place 100 mg vaginally at bedtime.    [provider]  spironolactone (ALDACTONE) 25 MG tablet Take 25 mg by mouth daily. 11/20/19   [provider]  imipramine (TOFRANIL) 25 MG tablet Take 1 tablet (25 mg total) by mouth at bedtime. 12/28/10 05/04/11  Jetty Duhamel D, MD  progesterone (PROMETRIUM) 100 MG capsule Take 100 mg by mouth daily.    05/30/19  [provider]    Family History Family History  Problem Relation Age of Onset   Hypertension Mother    Hyperlipidemia Mother    Liver cancer Mother 50       died in 09-May-2011, at age 88 or 3.   Allergies Mother    Diabetes Mother 53       onset "mid fifties."   Healthy Father    Dementia Father 17       not officialy diagnosed   Asthma Maternal Grandmother    Diabetes Maternal Grandmother    Asthma Cousin    Healthy Other        2 siblings   Allergic rhinitis Neg Hx    Angioedema Neg Hx    Eczema Neg Hx    Immunodeficiency Neg Hx    Urticaria Neg Hx     Social History Social History   Tobacco Use   Smoking status: Never    Passive exposure: Never   Smokeless tobacco: Never  Vaping Use   Vaping status: Never Used  Substance Use Topics   Alcohol use: Yes    Comment: less than once a month   Drug use: No     Allergies   Azithromycin,  Codeine, Penicillins, Metronidazole, Nitrofurantoin macrocrystal, and Shellfish allergy   Review of Systems Review of Systems  Constitutional:  Positive for chills. Negative for appetite change, fatigue and fever.  HENT:  Positive for congestion, ear pain and rhinorrhea. Negative for sore throat and trouble swallowing.   Respiratory:  Positive for cough. Negative for shortness of breath and wheezing.   Cardiovascular:  Negative for chest pain.  Gastrointestinal:  Negative for diarrhea, nausea and vomiting.     Physical Exam Triage Vital Signs ED Triage Vitals  Encounter Vitals Group     BP 04/09/23 1025 (!) 143/77  Systolic BP Percentile --      Diastolic BP Percentile --      Pulse Rate 04/09/23 1025 83     Resp 04/09/23 1025 16     Temp 04/09/23 1025 98.1 F (36.7 C)     Temp Source 04/09/23 1025 Oral     SpO2 04/09/23 1025 98 %     Weight 04/09/23 1025 140 lb (63.5 kg)     Height --      Head Circumference --      Peak Flow --      Pain Score 04/09/23 1032 3     Pain Loc --      Pain Education --      Exclude from Growth Chart --    No data found.  Updated Vital Signs BP (!) 143/77 (BP Location: Right Arm)   Pulse 83   Temp 98.1 F (36.7 C) (Oral)   Resp 16   Wt 140 lb (63.5 kg)   SpO2 98%   BMI 24.03 kg/m   Visual Acuity Right Eye Distance:   Left Eye Distance:   Bilateral Distance:    Right Eye Near:   Left Eye Near:    Bilateral Near:     Physical Exam Vitals and nursing note reviewed.  Constitutional:      Appearance: She is not ill-appearing.  HENT:     Head: Normocephalic and atraumatic.     Right Ear: Tympanic membrane and ear canal normal.     Left Ear: Tympanic membrane and ear canal normal.     Ears:     Comments: Mild effusion left ear    Nose: Congestion present. No rhinorrhea.     Right Sinus: No maxillary sinus tenderness or frontal sinus tenderness.     Left Sinus: No maxillary sinus tenderness or frontal sinus tenderness.      Mouth/Throat:     Mouth: Mucous membranes are moist.     Pharynx: Oropharynx is clear. No oropharyngeal exudate.  Eyes:     Conjunctiva/sclera: Conjunctivae normal.  Cardiovascular:     Rate and Rhythm: Normal rate.     Heart sounds: Normal heart sounds.  Pulmonary:     Effort: Pulmonary effort is normal. No respiratory distress.     Breath sounds: Normal breath sounds. No wheezing or rales.  Musculoskeletal:     Cervical back: Neck supple.  Lymphadenopathy:     Cervical: No cervical adenopathy.  Neurological:     Mental Status: She is alert.  Psychiatric:        Mood and Affect: Mood normal.      UC Treatments / Results  Labs (all labs ordered are listed, but only abnormal results are displayed) Labs Reviewed - No data to display  EKG   Radiology No results found.  Procedures Procedures (including critical care time)  Medications Ordered in UC Medications - No data to display  Initial Impression / Assessment and Plan / UC Course  I have reviewed the triage vital signs and the nursing notes.  Pertinent labs & imaging results that were available during my care of the patient were reviewed by me and considered in my medical decision making (see chart for details).     Discussed with patient likely viral etiology if symptoms worsen or she develops fever nasal drainage can fill the prescription Final Clinical Impressions(s) / UC Diagnoses   Final diagnoses:  None   Discharge Instructions   None    ED Prescriptions   None  PDMP not reviewed this encounter.   Meliton Rattan, [place] 04/09/23 1101

## 2023-04-27 NOTE — Progress Notes (Unsigned)
   Established Patient Office Visit  Subjective   Patient ID: Dominique Brock, female    DOB: September 10, 1955  Age: 68 y.o. MRN: [id number]  No chief complaint on file.   HPI  Htn - atenolol  Hld - atorvastatin 10mg   Dm2 - lifestyle   The ASCVD Risk score (Arnett DK, et al., 2019) failed to calculate for the following reasons:   The valid total cholesterol range is 130 to 320 mg/dL  Health Maintenance Due  Topic Date Due   Hepatitis C Screening  Never done   DTaP/Tdap/Td (2 - Td or Tdap) 05/20/2021   COVID-19 Vaccine (3 - 2024-25 season) 10/16/2022   HEMOGLOBIN A1C  03/30/2023      Objective:     There were no vitals taken for this visit. {Vitals History (Optional):23777}  Physical Exam   No results found for any visits on 04/28/23.      Assessment & Plan:   There are no diagnoses linked to this encounter.   No follow-ups on file.    Sandre Kitty, MD

## 2023-04-28 ENCOUNTER — Encounter: Payer: Self-pay | Admitting: Family Medicine

## 2023-04-28 ENCOUNTER — Other Ambulatory Visit: Payer: Self-pay | Admitting: Family Medicine

## 2023-04-28 ENCOUNTER — Ambulatory Visit: Admitting: Family Medicine

## 2023-04-28 ENCOUNTER — Ambulatory Visit: Payer: Medicare Other | Admitting: Family Medicine

## 2023-04-28 VITALS — BP 129/72 | HR 66 | Ht 64.0 in | Wt 145.1 lb

## 2023-04-28 DIAGNOSIS — E1169 Type 2 diabetes mellitus with other specified complication: Secondary | ICD-10-CM

## 2023-04-28 DIAGNOSIS — E039 Hypothyroidism, unspecified: Secondary | ICD-10-CM | POA: Diagnosis not present

## 2023-04-28 LAB — POCT GLYCOSYLATED HEMOGLOBIN (HGB A1C): HbA1c POC (<> result, manual entry): 8.6 % (ref 4.0–5.6)

## 2023-04-28 MED ORDER — SEMAGLUTIDE(0.25 OR 0.5MG/DOS) 2 MG/3ML ~~LOC~~ SOPN: 0.2500 mg | PEN_INJECTOR | SUBCUTANEOUS | 1 refills | Status: DC

## 2023-04-28 NOTE — Patient Instructions (Signed)
 It was nice to see you today,  We addressed the following topics today: -Your A1c was 8.6 today.  I will send in the Ozempic.  Once you get this medicine you will take it once a week like we showed you. - If you get any symptoms such as nausea let us know and we can send in a prescription for nausea medicine.  You can treat constipation with MiraLAX which is over-the-counter.  Have a great day,  Frederic Jericho, MD

## 2023-04-28 NOTE — Assessment & Plan Note (Signed)
 Patient currently on 90 mg Armour Thyroid that is prescribed by [facility] sky MD.  I do not have access to their lab results.  Will need to send in records request in 2 weeks, patient has a visit with them next week in which tsh is drawn.

## 2023-04-28 NOTE — Assessment & Plan Note (Signed)
 A1c elevated today at 8.6, was previously controlled with diet alone.  Patient does endorse troubles controlling her appetite which has led to this increase.  She does not tolerate metformin.  Has tried multiple formulations in the past.  Is on a low-carb diet.  Will send in Ozempic.  Will titrate up monthly over the next 3 months and see her back in 3 months.

## 2023-05-03 ENCOUNTER — Other Ambulatory Visit: Payer: Self-pay | Admitting: Family Medicine

## 2023-05-04 ENCOUNTER — Other Ambulatory Visit: Payer: Self-pay | Admitting: Family Medicine

## 2023-05-04 MED ORDER — TRULICITY 0.75 MG/0.5ML ~~LOC~~ SOAJ: 0.7500 mg | SUBCUTANEOUS | 1 refills | Status: DC

## 2023-05-08 ENCOUNTER — Other Ambulatory Visit: Payer: Self-pay | Admitting: Family Medicine

## 2023-05-08 MED ORDER — TRULICITY 0.75 MG/0.5ML ~~LOC~~ SOAJ: 0.7500 mg | SUBCUTANEOUS | 1 refills | Status: DC

## 2023-05-13 ENCOUNTER — Other Ambulatory Visit: Payer: Self-pay | Admitting: Family Medicine

## 2023-05-13 DIAGNOSIS — E1169 Type 2 diabetes mellitus with other specified complication: Secondary | ICD-10-CM

## 2023-05-13 DIAGNOSIS — K219 Gastro-esophageal reflux disease without esophagitis: Secondary | ICD-10-CM

## 2023-05-30 ENCOUNTER — Emergency Department (HOSPITAL_BASED_OUTPATIENT_CLINIC_OR_DEPARTMENT_OTHER)
Admission: EM | Admit: 2023-05-30 | Discharge: 2023-05-30 | Disposition: A | Discharge status: Home / Self Care | Attending: Emergency Medicine | Admitting: Emergency Medicine

## 2023-05-30 ENCOUNTER — Encounter (HOSPITAL_BASED_OUTPATIENT_CLINIC_OR_DEPARTMENT_OTHER): Payer: Self-pay | Admitting: Emergency Medicine

## 2023-05-30 ENCOUNTER — Emergency Department (HOSPITAL_BASED_OUTPATIENT_CLINIC_OR_DEPARTMENT_OTHER)

## 2023-05-30 ENCOUNTER — Other Ambulatory Visit: Payer: Self-pay

## 2023-05-30 ENCOUNTER — Ambulatory Visit: Payer: Self-pay

## 2023-05-30 DIAGNOSIS — H539 Unspecified visual disturbance: Secondary | ICD-10-CM

## 2023-05-30 DIAGNOSIS — H538 Other visual disturbances: Secondary | ICD-10-CM | POA: Insufficient documentation

## 2023-05-30 LAB — CBC WITH DIFFERENTIAL/PLATELET
Abs Immature Granulocytes: 0.02 10*3/uL (ref 0.00–0.07)
Basophils Absolute: 0.1 10*3/uL (ref 0.0–0.1)
Basophils Relative: 1 %
Eosinophils Absolute: 0.2 10*3/uL (ref 0.0–0.5)
Eosinophils Relative: 3 %
HCT: 47.9 % — ABNORMAL HIGH (ref 36.0–46.0)
Hemoglobin: 16 g/dL — ABNORMAL HIGH (ref 12.0–15.0)
Immature Granulocytes: 0 %
Lymphocytes Relative: 39 %
Lymphs Abs: 3.3 10*3/uL (ref 0.7–4.0)
MCH: 29.9 pg (ref 26.0–34.0)
MCHC: 33.4 g/dL (ref 30.0–36.0)
MCV: 89.5 fL (ref 80.0–100.0)
Monocytes Absolute: 0.7 10*3/uL (ref 0.1–1.0)
Monocytes Relative: 8 %
Neutro Abs: 4.2 10*3/uL (ref 1.7–7.7)
Neutrophils Relative %: 49 %
Platelets: 220 10*3/uL (ref 150–400)
RBC: 5.35 MIL/uL — ABNORMAL HIGH (ref 3.87–5.11)
RDW: 14.1 % (ref 11.5–15.5)
WBC: 8.5 10*3/uL (ref 4.0–10.5)
nRBC: 0 % (ref 0.0–0.2)

## 2023-05-30 LAB — BASIC METABOLIC PANEL WITH GFR
Anion gap: 7 (ref 5–15)
BUN: 16 mg/dL (ref 8–23)
CO2: 26 mmol/L (ref 22–32)
Calcium: 9.2 mg/dL (ref 8.9–10.3)
Chloride: 104 mmol/L (ref 98–111)
Creatinine, Ser: 0.8 mg/dL (ref 0.44–1.00)
GFR, Estimated: >60 mL/min (ref >60)
Glucose, Bld: 110 mg/dL — ABNORMAL HIGH (ref 70–99)
Potassium: 3.6 mmol/L (ref 3.5–5.1)
Sodium: 137 mmol/L (ref 135–145)

## 2023-05-30 MED ORDER — TETRACAINE HCL 0.5 % OP SOLN
1.0000 [drp] | Freq: Once | OPHTHALMIC | Status: DC
  Filled 2023-05-30: qty 4

## 2023-05-30 NOTE — ED Triage Notes (Signed)
 Visual field disturbance Right eye Started 2 weeks (after dental implant) Dull ache in eye, painful with bright lights

## 2023-05-30 NOTE — Telephone Encounter (Signed)
 Chief Complaint: Eye pain and vision changes Symptoms: Right eye dull pain, vision changes Frequency: 2 week span of different symptoms Pertinent Negatives: Patient denies numbness/weakness of arms, legs, face Disposition: [x] ED /[] Urgent Care (no appt availability in office) / [] Appointment(In office/virtual)/ []  [facility]/ [] Home Care/ [] Refused Recommended Disposition /[] [facility]/ []  Follow-up with PCP Additional Notes: Patient called to make an appt for pain in her right eye. Upon further assessment, patient states she had a dental procedure 2 weeks ago that lasted 5 minutes for a tooth implant and she was not placed under general anesthesia. However; patient stated following this procedure she was in pretty bad pain on the whole right side of her head for about a week that her dentist was treating with OTC pain medication. Patient states she initially had symptoms of seeing "red stain-like" appearance over her vision. Last night she started having eye pain and noticed that parts of her vision in her right eye were missing. She described this as "I am looking at the number 407 right now, and if I cover my left eye, I can only see parts of the 407, not all of it, with my right eye". This RN assessed for further stroke symptoms and patient was negative, but did state she feels her right eye is drooping. This RN advised ED asap for evaluation. Patient states she will go to [facility] ED now.    opied from CRM [id number]. Topic: Clinical - Red Word Triage >> May 30, 2023 10:50 AM Geroge Baseman wrote: Red Word that prompted transfer to Nurse Triage: Patient is having pain in her right eye, issue began after dental surgery. States seeing a red floater but not recently. Eyesight is now skewed or there is a white blind spot. She is getting an ache in her right eye that will not go away. Reason for Disposition  SEVERE eye pain    Dull pain accompanied by loss of vision field  Answer  Assessment - Initial Assessment Questions 1. ONSET: "When did the pain start?" (e.g., minutes, hours, days)     Yesterday  2. TIMING: "Does the pain come and go, or has it been constant since it started?" (e.g., constant, intermittent, fleeting)     Pain begun yesterday 3. SEVERITY: "How bad is the pain?"   (Scale 1-10; mild, moderate or severe)   - MILD (1-3): doesn't interfere with normal activities    - MODERATE (4-7): interferes with normal activities or awakens from sleep    - SEVERE (8-10): excruciating pain and patient unable to do normal activities     Dull ache 4. LOCATION: "Where does it hurt?"  (e.g., eyelid, eye, cheekbone)     "It feels like a dull pain inside of my eye, not eyeball but behind eye" 5. CAUSE: "What do you think is causing the pain?"     Patient uncertain, she believes maybe a response to recent dental surgery 6. VISION: "Do you have blurred vision or changes in your vision?"      Yes 7. EYE DISCHARGE: "Is there any discharge (pus) from the eye(s)?"  If Yes, ask: "What color is it?"      No 8. FEVER: "Do you have a fever?" If Yes, ask: "What is it, how was it measured, and when did it start?"      No 9. OTHER SYMPTOMS: "Do you have any other symptoms?" (e.g., headache, nasal discharge, facial rash)     N/a  Protocols used: Eye Pain  and Other Symptoms-A-AH

## 2023-05-30 NOTE — ED Provider Notes (Signed)
 [name] Provider Note   CSN: [id number] Arrival date & time: 05/30/23  1406     History  Chief Complaint  Patient presents with   Eye Problem    Dominique Brock is a 68 y.o. female with a history of nearsightedness and cataracts who wears glasses who presents emergency department with vision change in the right eye.  Patient reports Dominique Brock had a dental implant about 2 weeks ago.  After Dominique Brock had the implant placed Dominique Brock noticed that every time Dominique Brock would close her eye and then open it Dominique Brock would see red in her visual field for a period of time and it would resolve.  Dominique Brock states that this stopped on its own however yesterday Dominique Brock began having what Dominique Brock describes as "skewed or twisted vision" in her right eye.  Patient states that when Dominique Brock covers her left eye words and images look abnormal.  Dominique Brock denies any other symptoms.  Dominique Brock does not have a headache.  Dominique Brock is having light sensitivity in that eye to extremely bright light.   Eye Problem      Home Medications Prior to Admission medications   Medication Sig Start Date End Date Taking? Authorizing Provider  ARMOUR THYROID  30 MG tablet Take 30 mg by mouth daily. 01/31/23   [provider]  ARMOUR THYROID  60 MG tablet TAKE 1 TABLET BY MOUTH EVERY DAY ON EMPTY STOMACH FOR 90 DAYS    [provider]  atenolol  (TENORMIN ) 25 MG tablet Take 1 tablet (25 mg total) by mouth daily. 10/03/22   Noreene Bearded, PA  atorvastatin  (LIPITOR) 10 MG tablet TAKE 1 TABLET (10 MG TOTAL) BY MOUTH DAILY. **CALL TO SCHEDULE A FOLLOW UP FOR FUTURE MED REFILLS** 05/17/23   Laneta Pintos, MD  Azelastine  HCl 137 MCG/SPRAY SOLN PLACE 1 SPRAY INTO BOTH NOSTRILS 2 (TWO) TIMES DAILY. USE IN EACH NOSTRIL AS DIRECTED 08/19/22   Maryclare Smoke A, PA  cholecalciferol (VITAMIN D3) 25 MCG (1000 UT) tablet Take 1,000 Units by mouth daily.    [provider]  Continuous Blood Gluc Sensor (DEXCOM G6 SENSOR) MISC Use  as directed. 07/28/21   Abonza, Maritza, PA-C  Continuous Blood Gluc Transmit (DEXCOM G6 TRANSMITTER) MISC Used as directed. 07/28/21   Abonza, Maritza, PA-C  Dulaglutide (TRULICITY) 0.75 MG/0.5ML SOAJ Inject 0.75 mg into the skin once a week. 05/08/23   Laneta Pintos, MD  EPINEPHrine  0.3 mg/0.3 mL IJ SOAJ injection Inject 0.3 mg into the muscle as needed for anaphylaxis. 11/30/22   Noreene Bearded, PA  fluticasone  (FLONASE ) 50 MCG/ACT nasal spray USE 2 SPRAY(S) IN EACH NOSTRIL ONCE DAILY 12/01/21   Abonza, Maritza, PA-C  Multiple Vitamin (MULTIVITAMIN) tablet Take 1 tablet by mouth daily.    [provider]  omeprazole  (PRILOSEC) 20 MG capsule TAKE 1 CAPSULE BY MOUTH EVERY DAY 05/17/23   Laneta Pintos, MD  pregabalin  (LYRICA ) 25 MG capsule Take 1 capsule (25 mg total) by mouth 3 (three) times daily as needed. 10/03/22   Maryclare Smoke A, PA  progesterone 200 MG SUPP Place 100 mg vaginally at bedtime.    [provider]  spironolactone (ALDACTONE) 25 MG tablet Take 25 mg by mouth daily. 11/20/19   [provider]  imipramine  (TOFRANIL ) 25 MG tablet Take 1 tablet (25 mg total) by mouth at bedtime. 12/28/10 05/04/11  Rosa College D, MD  progesterone (PROMETRIUM) 100 MG capsule Take 100 mg by mouth daily.  05/30/19  [provider]      Allergies    Azithromycin, Codeine, Penicillins, Metronidazole, Nitrofurantoin macrocrystal, and Shellfish allergy    Review of Systems   Review of Systems  Physical Exam Updated Vital Signs BP (!) 147/93   Pulse 72   Temp 98.4 F (36.9 C)   Resp 17   SpO2 98%  Physical Exam Vitals and nursing note reviewed.  Constitutional:      General: Dominique Brock is not in acute distress.    Appearance: Dominique Brock is well-developed. Dominique Brock is not diaphoretic.  HENT:     Head: Normocephalic and atraumatic.     Right Ear: External ear normal.     Left Ear: External ear normal.     Nose: Nose normal.     Mouth/Throat:     Mouth: Mucous  membranes are moist.  Eyes:     General: Lids are normal. No scleral icterus.    Extraocular Movements: Extraocular movements intact.     Conjunctiva/sclera:     Right eye: Right conjunctiva is not injected. No chemosis, exudate or hemorrhage.    Left eye: Left conjunctiva is not injected. No chemosis, exudate or hemorrhage.    Pupils: Pupils are equal, round, and reactive to light.     Visual Fields: Right eye visual fields normal and left eye visual fields normal.  Cardiovascular:     Rate and Rhythm: Normal rate and regular rhythm.     Heart sounds: Normal heart sounds. No murmur heard.    No friction rub. No gallop.  Pulmonary:     Effort: Pulmonary effort is normal. No respiratory distress.     Breath sounds: Normal breath sounds.  Abdominal:     General: Bowel sounds are normal. There is no distension.     Palpations: Abdomen is soft. There is no mass.     Tenderness: There is no abdominal tenderness. There is no guarding.  Musculoskeletal:     Cervical back: Normal range of motion.  Skin:    General: Skin is warm and dry.  Neurological:     Mental Status: Dominique Brock is alert and oriented to person, place, and time.  Psychiatric:        Behavior: Behavior normal.     ED Results / Procedures / Treatments   Labs (all labs ordered are listed, but only abnormal results are displayed) Labs Reviewed - No data to display  EKG None  Radiology No results found.  Procedures Procedures    Medications Ordered in ED Medications - No data to display  ED Course/ Medical Decision Making/ A&P Clinical Course as of 05/30/23 2027  Tue May 30, 2023  1840 Visual Acuity Bilateral Distance: 20/40 R Distance: 20/100 L Distance: 20/40   [AH]  1932 Pressure: OD 18; OS 20 [AH]    Clinical Course User Index [AH] Tama Fails, PA-C                                 Medical Decision Making Amount and/or Complexity of Data Reviewed Labs: ordered. Radiology:  ordered.  Risk Prescription drug management.   Patient here with visual disturbance.  Differential diagnosis includes glaucoma, retinal detachment, vitreous hemorrhage, retinal arterial or venous occlusion.  On physical examination patient has no obvious signs of eye injury, no proptosis.  Normal red reflex. Dominique Brock has normal bilateral pressures.  Visual acuity as documented in ED course is abnormal.  Her visual fields are  intact.  Dominique Brock has no severe eye pain, watering, mattering. Patient CT scan shows no evidence of infection in the sinus cavity tracking into the orbital space.  Patient will be discharged with close ophthalmology follow-up, driving precautions and strict return precautions.  Dominique Brock appears otherwise appropriate for discharge with close outpatient follow-up.        Final Clinical Impression(s) / ED Diagnoses Final diagnoses:  None    Rx / DC Orders ED Discharge Orders     None         Tama Fails, PA-C 05/30/23 2029    Sallyanne Creamer, DO 06/04/23 1334

## 2023-05-30 NOTE — Discharge Instructions (Signed)
 Your CT scan imaging showed no acute abnormalities.  You will need to follow very closely with the eye specialist.  Please call his office tomorrow to be seen in his clinic. Contact a health care provider if: Your visual disturbance changes or becomes worse. Get help right away if:  You have new visual disturbances. You suddenly see flashing lights or floaters. You suddenly have a dark area in your field of vision, especially in the lower part. This can lead to a loss of central vision. You suddenly lose vision in one or both eyes. You have any symptoms of a stroke. "BE FAST" is an easy way to remember the main warning signs of a stroke: B - Balance. Signs are dizziness, sudden trouble walking, or loss of balance. E - Eyes. Signs are trouble seeing or a sudden change in vision. F - Face. Signs are sudden weakness or numbness of the face, or the face or eyelid drooping on one side. A - Arms. Signs are weakness or numbness in an arm. This happens suddenly and usually on one side of the body. S - Speech. Signs are sudden trouble speaking, slurred speech, or trouble understanding what people say. T - Time. Time to call emergency services. Write down what time symptoms started. Have other signs of a stroke, such as: A sudden, severe headache with no known cause. Nausea or vomiting. A seizure. These symptoms may represent a serious problem that is an emergency. Do not wait to see if the symptoms will go away. Get medical help right away. Call your local emergency services (911 in the [place] Do not drive yourself to the hospital.

## 2023-06-24 ENCOUNTER — Other Ambulatory Visit: Payer: Self-pay | Admitting: Family Medicine

## 2023-06-24 DIAGNOSIS — E1159 Type 2 diabetes mellitus with other circulatory complications: Secondary | ICD-10-CM

## 2023-07-04 ENCOUNTER — Ambulatory Visit: Payer: Self-pay | Admitting: *Deleted

## 2023-07-04 NOTE — Telephone Encounter (Signed)
  Chief Complaint: left ear pain  Symptoms: left ear pain and head behind left ear pain. Dizziness at times.  Frequency: approx 1 month  Pertinent Negatives: Patient denies fever no discharge from ear. No ringing in ear Disposition: [] ED /[x] Urgent Care (no appt availability in office) / [] Appointment(In office/virtual)/ []  [facility]/ [] Home Care/ [] Refused Recommended Disposition /[] [facility]/ []  Follow-up with PCP Additional Notes:   No available appt until July. Called [facility] to confirm appt times. Recommended to patient to go to UC.       Copied from CRM [id number]. Topic: Clinical - Red Word Triage >> Jul 04, 2023  1:50 PM Marissa P wrote: Red Word that prompted transfer to Nurse Triage: Patient called saying some pain in ear, earache ongoing and nothing is helping did go to urgent care as well. Reason for Disposition  Earache  (Exceptions: brief ear pain of < 60 minutes duration, earache occurring during air travel  Answer Assessment - Initial Assessment Questions 1. LOCATION: "Which ear is involved?"     Left ear 2. ONSET: "When did the ear start hurting"      Approx 1 month  3. SEVERITY: "How bad is the pain?"  (Scale 1-10; mild, moderate or severe)   - MILD (1-3): doesn't interfere with normal activities    - MODERATE (4-7): interferes with normal activities or awakens from sleep    - SEVERE (8-10): excruciating pain, unable to do any normal activities      Not sleeping well , moderate  4. URI SYMPTOMS: "Do you have a runny nose or cough?"     Allergy sx  5. FEVER: "Do you have a fever?" If Yes, ask: "What is your temperature, how was it measured, and when did it start?"     Na  6. CAUSE: "Have you been swimming recently?", "How often do you use Q-TIPS?", "Have you had any recent air travel or scuba diving?"     na 7. OTHER SYMPTOMS: "Do you have any other symptoms?" (e.g., headache, stiff neck, dizziness, vomiting, runny nose, decreased hearing)      Head behind left ear, pain, dizziness 8. PREGNANCY: "Is there any chance you are pregnant?" "When was your last menstrual period?"     na  Protocols used: Louie Rover

## 2023-07-05 ENCOUNTER — Ambulatory Visit
Admission: RE | Admit: 2023-07-05 | Discharge: 2023-07-05 | Disposition: A | Discharge status: Home / Self Care | Source: Ambulatory Visit

## 2023-07-05 VITALS — BP 147/83 | HR 57 | Temp 97.6°F | Resp 18 | Wt 145.1 lb

## 2023-07-05 DIAGNOSIS — H6992 Unspecified Eustachian tube disorder, left ear: Secondary | ICD-10-CM | POA: Diagnosis not present

## 2023-07-05 DIAGNOSIS — Z889 Allergy status to unspecified drugs, medicaments and biological substances status: Secondary | ICD-10-CM | POA: Diagnosis not present

## 2023-07-05 DIAGNOSIS — H9202 Otalgia, left ear: Secondary | ICD-10-CM

## 2023-07-05 MED ORDER — PREDNISONE 20 MG PO TABS: 20.0000 mg | ORAL_TABLET | Freq: Two times a day (BID) | ORAL | 0 refills | Status: AC

## 2023-07-05 NOTE — ED Provider Notes (Signed)
 [facility]    CSN: [id number] Arrival date & time: 07/05/23  0946    History   Chief Complaint Chief Complaint  Patient presents with   Ear Fullness    Ear pain - Entered by patient    HPI Dominique Brock is a 68 y.o. female.   Patient is here for evaluation of left otalgia that has been present for approximately one month.  She wants to ensure her ear is not infected.  Associated symptoms include allergies evidenced by runny nose, sore throat, and cough that has been ongoing for weeks as well.  She takes Allegra, Sudafed, tylenol , ibuprofen, and astelin  nasal spray without any improvement.  No overt decreased hearing.  No reported fever, chest pain, shortness of breath, abdominal pain, vomiting, diarrhea, or headache.  No reported dizziness.    The history is provided by the patient.  Ear Fullness Pertinent negatives include no chest pain, no abdominal pain and no headaches.    Past Medical History:  Diagnosis Date   Asthma    Diabetes mellitus    prediabetic diet controlled   Eczema 06/15/2016   Hypertension    Hypothyroidism    Sleep apnea    only with allergies   Thyroid  disease     Patient Active Problem List   Diagnosis Date Noted   Elevated hemoglobin (HCC) 06/01/2022   Irritable bowel syndrome 04/06/2022   Acne 09/30/2020   Body mass index (BMI) 23.0-23.9, adult 09/30/2020   Chronic fatigue syndrome 09/30/2020   Hot flashes 09/30/2020   Low libido 09/30/2020   Menopause 09/30/2020   Primary insomnia 09/30/2020   Vaginal dryness 09/30/2020   Atypical squamous cells of undetermined significance on cytologic smear of cervix (ASC-US ) 08/19/2020   Atypical squamous cells of undetermined significance (ASCUS) on Papanicolaou smear of cervix 08/19/2020   Type 2 diabetes mellitus (HCC) 03/01/2019   Mixed diabetic hyperlipidemia associated with type 2 diabetes mellitus (HCC) 03/01/2019   Vitamin D  deficiency 03/01/2019   Gastroesophageal reflux disease  01/15/2018   Perennial allergic rhinitis 09/19/2016   Rhinitis medicamentosa 09/19/2016   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   Environmental and seasonal allergies 06/15/2016   Anemia 06/15/2016   Eczema 06/15/2016   Hypertension associated with diabetes (HCC) 06/15/2016   Hypothyroidism 02/05/2016   Chronic sinusitis 02/29/2008   Lipoma of left axilla 06/12/2007   Moderate persistent asthma 02/17/2007   Obstructive sleep apnea, hx of 02/17/2007    Past Surgical History:  Procedure Laterality Date   ABDOMINAL HYSTERECTOMY     BILATERAL OOPHORECTOMY     dental     EXCISION MASS UPPER EXTREMETIES Left 04/18/2019   Procedure: Excision of left arm mass / lipoma;  Surgeon: Thornell Flirt, DO;  Location: [facility];  Service: Plastics;  Laterality: Left;   SINUS EXPLORATION  1996    OB History   No obstetric history on file.      Home Medications    Prior to Admission medications   Medication Sig Start Date End Date Taking? Authorizing Provider  dorzolamide-timolol (COSOPT) 2-0.5 % ophthalmic solution Place 1 drop into the right eye 2 (two) times daily. 06/28/23  Yes [provider]  metroNIDAZOLE (METROCREAM) 0.75 % cream Apply 1 Application topically 2 (two) times daily. 03/29/23  Yes [provider]  predniSONE  (DELTASONE ) 20 MG tablet Take 1 tablet (20 mg total) by mouth 2 (two) times daily for 5 days. 07/05/23 07/10/23 Yes Genene Kennel, FNP  ARMOUR  THYROID  30 MG tablet Take 30 mg by mouth daily. 01/31/23   [provider]  ARMOUR THYROID  60 MG tablet TAKE 1 TABLET BY MOUTH EVERY DAY ON EMPTY STOMACH FOR 90 DAYS    [provider]  atenolol  (TENORMIN ) 25 MG tablet TAKE 1 TABLET (25 MG TOTAL) BY MOUTH DAILY. 06/26/23   Laneta Pintos, MD  atorvastatin  (LIPITOR) 10 MG tablet TAKE 1 TABLET (10 MG TOTAL) BY MOUTH DAILY. **CALL TO SCHEDULE A FOLLOW UP FOR FUTURE MED REFILLS** 05/17/23   Laneta Pintos, MD  Azelastine   HCl 137 MCG/SPRAY SOLN PLACE 1 SPRAY INTO BOTH NOSTRILS 2 (TWO) TIMES DAILY. USE IN EACH NOSTRIL AS DIRECTED 08/19/22   Maryclare Smoke A, PA  cholecalciferol (VITAMIN D3) 25 MCG (1000 UT) tablet Take 1,000 Units by mouth daily.    [provider]  clindamycin (CLEOCIN) 150 MG capsule TAKE 1 CAPSULE BY MOUTH 4 TIMES DAILY FOR INFECTION    [provider]  Continuous Blood Gluc Sensor (DEXCOM G6 SENSOR) MISC Use as directed. 07/28/21   Abonza, Maritza, PA-C  Continuous Blood Gluc Transmit (DEXCOM G6 TRANSMITTER) MISC Used as directed. 07/28/21   Abonza, Maritza, PA-C  Dulaglutide (TRULICITY) 0.75 MG/0.5ML SOAJ Inject 0.75 mg into the skin once a week. 05/08/23   Laneta Pintos, MD  EPINEPHrine  0.3 mg/0.3 mL IJ SOAJ injection Inject 0.3 mg into the muscle as needed for anaphylaxis. 11/30/22   Noreene Bearded, PA  fluticasone  (FLONASE ) 50 MCG/ACT nasal spray USE 2 SPRAY(S) IN EACH NOSTRIL ONCE DAILY 12/01/21   Abonza, Maritza, PA-C  Multiple Vitamin (MULTIVITAMIN) tablet Take 1 tablet by mouth daily.    [provider]  omeprazole  (PRILOSEC) 20 MG capsule TAKE 1 CAPSULE BY MOUTH EVERY DAY 05/17/23   Laneta Pintos, MD  Omeprazole  Magnesium (PRILOSEC PO)     [provider]  pregabalin  (LYRICA ) 25 MG capsule Take 1 capsule (25 mg total) by mouth 3 (three) times daily as needed. 10/03/22   Maryclare Smoke A, PA  progesterone 200 MG SUPP Place 100 mg vaginally at bedtime.    [provider]  spironolactone (ALDACTONE) 25 MG tablet Take 25 mg by mouth daily. 11/20/19   [provider]  imipramine  (TOFRANIL ) 25 MG tablet Take 1 tablet (25 mg total) by mouth at bedtime. 12/28/10 05/04/11  Rosa College D, MD  progesterone (PROMETRIUM) 100 MG capsule Take 100 mg by mouth daily.    05/30/19  [provider]    Family History Family History  Problem Relation Age of Onset   Hypertension Mother    Hyperlipidemia Mother    Liver cancer Mother 9        died in 15-Aug-2011, at age 8 or 63.   Allergies Mother    Diabetes Mother 64       onset "mid fifties."   Healthy Father    Dementia Father 24       not officialy diagnosed   Asthma Maternal Grandmother    Diabetes Maternal Grandmother    Asthma Cousin    Healthy Other        2 siblings   Allergic rhinitis Neg Hx    Angioedema Neg Hx    Eczema Neg Hx    Immunodeficiency Neg Hx    Urticaria Neg Hx     Social History Social History   Tobacco Use   Smoking status: Never    Passive exposure: Never   Smokeless tobacco: Never  Vaping Use  Vaping status: Never Used  Substance Use Topics   Alcohol use: Yes    Comment: less than once a month   Drug use: No     Allergies   Azithromycin, Codeine, Penicillins, Metronidazole, Nitrofurantoin macrocrystal, and Shellfish allergy   Review of Systems Review of Systems  Constitutional:  Negative for chills and fever.  HENT:  Positive for ear pain, rhinorrhea and sore throat. Negative for congestion, ear discharge and hearing loss.   Eyes:  Negative for pain and redness.  Respiratory:  Positive for cough.   Cardiovascular:  Negative for chest pain and palpitations.  Gastrointestinal:  Negative for abdominal pain, diarrhea and vomiting.  Genitourinary:  Negative for decreased urine volume and dysuria.  Musculoskeletal:  Negative for back pain and myalgias.  Skin:  Negative for rash.  Neurological:  Negative for dizziness and headaches.     Physical Exam Triage Vital Signs ED Triage Vitals  Encounter Vitals Group     BP 07/05/23 0957 (!) 147/83     Systolic BP Percentile --      Diastolic BP Percentile --      Pulse Rate 07/05/23 0957 (!) 57     Resp 07/05/23 0957 18     Temp 07/05/23 0957 97.6 F (36.4 C)     Temp Source 07/05/23 0957 Oral     SpO2 07/05/23 0957 98 %     Weight 07/05/23 0957 145 lb 1 oz (65.8 kg)     Height --      Head Circumference --      Peak Flow --      Pain Score 07/05/23 0956 5     Pain Loc --       Pain Education --      Exclude from Growth Chart --    No data found.  Updated Vital Signs BP (!) 147/83 (BP Location: Left Arm)   Pulse (!) 57   Temp 97.6 F (36.4 C) (Oral)   Resp 18   Wt 145 lb 1 oz (65.8 kg)   SpO2 98%   BMI 24.90 kg/m   Physical Exam Vitals and nursing note reviewed.  Constitutional:      Appearance: Normal appearance.  HENT:     Head: Normocephalic.     Right Ear: Tympanic membrane, ear canal and external ear normal. There is no impacted cerumen.     Left Ear: Tympanic membrane, ear canal and external ear normal. There is no impacted cerumen.     Nose: Nose normal. No rhinorrhea.     Mouth/Throat:     Mouth: Mucous membranes are moist.     Pharynx: No posterior oropharyngeal erythema.  Eyes:     Conjunctiva/sclera: Conjunctivae normal.     Pupils: Pupils are equal, round, and reactive to light.  Cardiovascular:     Rate and Rhythm: Regular rhythm. Bradycardia present.     Heart sounds: Normal heart sounds.     Comments: History of same noted in previous visits. Pulmonary:     Effort: Pulmonary effort is normal.     Breath sounds: Normal breath sounds.  Abdominal:     General: Bowel sounds are normal.  Skin:    General: Skin is warm and dry.  Neurological:     General: No focal deficit present.     Mental Status: She is alert and oriented to person, place, and time.  Psychiatric:        Mood and Affect: Mood normal.  Behavior: Behavior normal.        Thought Content: Thought content normal.        Judgment: Judgment normal.    UC Treatments / Results  Labs (all labs ordered are listed, but only abnormal results are displayed) Labs Reviewed - No data to display  EKG   Radiology No results found.  Procedures Procedures (including critical care time)  Medications Ordered in UC Medications - No data to display  Initial Impression / Assessment and Plan / UC Course  I have reviewed the triage vital signs and the nursing  notes.  Pertinent labs & imaging results that were available during my care of the patient were reviewed by me and considered in my medical decision making (see chart for details).     Patient presenting for ongoing left otalgia in the setting of seasonal allergy symptoms including runny nose, sore throat, and cough.  No signs of infection or structural abnormalities noted on physical examination.  I discussed that her symptoms are likely due to eustachian tube dysfunction.  Will switch her Allegra to daily Zyrtec and add fluticasone  nasal spray.  Have also prescribed a short low-dose prednisone  burst to see if this helps alleviate her symptoms.  (Does not routinely check her blood sugars but aware that prednisone  may elevate them).  Follow-up with her primary care provider should the symptoms worsen or fail to improve.  Final Clinical Impressions(s) / UC Diagnoses   Final diagnoses:  Acute otalgia, left  Eustachian tube disorder, left  H/O seasonal allergies     Discharge Instructions      Your symptoms are likely related to eustachian tube dysfunction.  Recommend changing your allergy medication from Allegra to Zyrtec.  Add Flonase  nasal spray daily.  A small low dose prednisone  burst has been prescribed to see if this helps alleviate your symptoms.  Follow up with your PCP as needed.  ED Prescriptions     Medication Sig Dispense Auth. Provider   predniSONE  (DELTASONE ) 20 MG tablet Take 1 tablet (20 mg total) by mouth 2 (two) times daily for 5 days. 10 tablet Genene Kennel, FNP      PDMP not reviewed this encounter.   Genene Kennel, FNP 07/05/23 [phone]

## 2023-07-05 NOTE — ED Triage Notes (Signed)
 Pt presents c/o otalgia x 1 month. Pt says she has tried allergy meds for relief but sxs did not improve. Pt says the pain spreads to a little behind her ear into her head as well.

## 2023-07-05 NOTE — Discharge Instructions (Signed)
 Your symptoms are likely related to eustachian tube dysfunction.  Recommend changing your allergy medication from Allegra to Zyrtec.  Add Flonase  nasal spray daily.  A small low dose prednisone  burst has been prescribed to see if this helps alleviate your symptoms.  Follow up with your PCP as needed.

## 2023-07-29 ENCOUNTER — Other Ambulatory Visit: Payer: Self-pay | Admitting: Family Medicine

## 2023-07-29 DIAGNOSIS — J3089 Other allergic rhinitis: Secondary | ICD-10-CM

## 2023-07-31 ENCOUNTER — Ambulatory Visit (INDEPENDENT_AMBULATORY_CARE_PROVIDER_SITE_OTHER): Admitting: Family Medicine

## 2023-07-31 ENCOUNTER — Telehealth: Payer: Self-pay

## 2023-07-31 ENCOUNTER — Encounter: Payer: Self-pay | Admitting: Family Medicine

## 2023-07-31 VITALS — BP 118/74 | HR 78 | Ht 64.0 in | Wt 144.8 lb

## 2023-07-31 DIAGNOSIS — E1169 Type 2 diabetes mellitus with other specified complication: Secondary | ICD-10-CM

## 2023-07-31 LAB — POCT GLYCOSYLATED HEMOGLOBIN (HGB A1C): HbA1c POC (<> result, manual entry): 9.1 % (ref 4.0–5.6)

## 2023-07-31 MED ORDER — TRULICITY 0.75 MG/0.5ML ~~LOC~~ SOAJ: 0.7500 mg | SUBCUTANEOUS | 1 refills | Status: DC

## 2023-07-31 MED ORDER — OZEMPIC (0.25 OR 0.5 MG/DOSE) 2 MG/3ML ~~LOC~~ SOPN: 0.2500 mg | PEN_INJECTOR | SUBCUTANEOUS | 0 refills | Status: DC

## 2023-07-31 NOTE — Progress Notes (Unsigned)
   Established Patient Office Visit  Subjective   Patient ID: Dominique Brock, female    DOB: Jul 16, 1955  Age: 68 y.o. MRN: [id number]  Chief Complaint  Patient presents with   Medical Management of Chronic Issues    HPI  Subjective - Diabetes management: Ozempic  denied by insurance, Trulicity  offered as alternative but would cost $500 - Reports previously tried Metformin  (regular and slow-release) with intolerance - experienced stomach issues, exhaustion, either on the couch or in the bathroom - Has upcoming cataract surgery on Wednesday and tracheotomy surgery next Tuesday - Insurance: Has Medicare with separate prescription coverage (possibly through [place]) - Reports paying full price for Armor Thyroid  medication  Medications: Armor Thyroid , dosage not specified  PMH, PSH, FH, Social Hx: Diabetes, hypothyroidism, cataracts  ROS: Denies numbness or tingling in feet    The ASCVD Risk score (Arnett DK, et al., 2019) failed to calculate for the following reasons:   The valid total cholesterol range is 130 to 320 mg/dL  Health Maintenance Due  Topic Date Due   Hepatitis C Screening  Never done   DTaP/Tdap/Td (2 - Td or Tdap) 05/20/2021   COVID-19 Vaccine (3 - 2024-25 season) 10/16/2022   FOOT EXAM  06/01/2023      Objective:     BP 118/74   Pulse 78   Ht 5' 4 (1.626 m)   Wt 144 lb 12.8 oz (65.7 kg)   SpO2 100%   BMI 24.85 kg/m  {Vitals History (Optional):23777}  Physical Exam   No results found for any visits on 07/31/23.      Assessment & Plan:   Type 2 diabetes mellitus with other specified complication, without long-term current use of insulin (HCC) -     POCT glycosylated hemoglobin (Hb A1C)     No follow-ups on file.    Laneta Pintos, MD

## 2023-07-31 NOTE — Telephone Encounter (Signed)
 Copied from CRM [id number]. Topic: Clinical - Prescription Issue >> Jul 31, 2023 11:20 AM Elle L wrote: Reason for CRM: The patient is requesting a prescription for Ozempic  instead of Trulicity  as Ozempic  is $195 and the Trulicity  is $328. However, it will need a prior authorization and their prior authorization number is [phone]. The patient's call back number is [phone].

## 2023-07-31 NOTE — Patient Instructions (Addendum)
 It was nice to see you today,  We addressed the following topics today: -Reach out to your insurance company who covers your prescriptions and asked them if there are alternatives to the current pricing model they have you on such as monthly payment installments. - Please let me know before next visit if the medication still not affordable and I will try alternatives. - Your A1c was 9.1 today.  It is still elevated so I would like to try to put you on something to lower this before I see you again.  Have a great day,  Etha Henle, MD

## 2023-07-31 NOTE — Telephone Encounter (Signed)
Ozempic sent in

## 2023-08-01 LAB — MICROALBUMIN / CREATININE URINE RATIO
Creatinine, Urine: 54.4 mg/dL
Microalb/Creat Ratio: 11 mg/g{creat} (ref 0–29)
Microalbumin, Urine: 6.1 ug/mL

## 2023-08-02 ENCOUNTER — Other Ambulatory Visit: Payer: Self-pay

## 2023-08-02 NOTE — Assessment & Plan Note (Signed)
 Previously failed Metformin  therapy due to GI intolerance and fatigue. Currently without medication management due to insurance denial of Ozempic  and cost barriers for Trulicity . - Resent prescription for Trulicity  - Advised to contact insurance regarding prescription coverage options and payment plans - Recommended manufacturer coupon/copay card for Trulicity  [employer]) - Requested patient to message if medication denied again - Completed annual diabetic foot exam - normal findings - Ordered urine microalbumin test - Confirmed patient has upcoming eye exam for diabetic retinopathy screening - Will check lipids at next visit (last done 09/2022)

## 2023-08-03 ENCOUNTER — Other Ambulatory Visit: Payer: Self-pay | Admitting: Family Medicine

## 2023-08-03 ENCOUNTER — Telehealth: Payer: Self-pay

## 2023-08-03 ENCOUNTER — Other Ambulatory Visit: Payer: Self-pay

## 2023-08-03 NOTE — Telephone Encounter (Signed)
 Copied from CRM [id number]. Topic: Clinical - Medication Prior Auth >> Aug 03, 2023 10:28 AM Hobson Luna F wrote: Reason for CRM: Patient is calling in because she wants to know if Dr. Arabella Beach has called her insurance to do the prior authorization for Ozempic . Patient says the provider needs to call [phone]. She spoke with [facility] and they hadn't heard anything from the office. Patient is requesting a follow up call.

## 2023-08-03 NOTE — Telephone Encounter (Signed)
Fixed prescription and resent to pharmacy

## 2023-08-03 NOTE — Telephone Encounter (Signed)
 Copied from CRM [id number]. Topic: Clinical - Prescription Issue >> Aug 03, 2023  3:26 PM Sanjuana Crutch wrote: Reason for CRM: [facility] because prescription sent over wasn't signed by provider , and the prescription was wrong it was supposed to be and not for Ozempic .

## 2023-08-03 NOTE — Telephone Encounter (Signed)
 Spoke with patient on the phone. Informed her that the PA information was filled out today and faxed back to her insurance company. Patient verbalized understanding and was in agreement with the plan.

## 2023-08-04 ENCOUNTER — Other Ambulatory Visit: Payer: Self-pay

## 2023-08-04 MED ORDER — OZEMPIC (0.25 OR 0.5 MG/DOSE) 2 MG/3ML ~~LOC~~ SOPN: 0.2500 mg | PEN_INJECTOR | SUBCUTANEOUS | 1 refills | Status: DC

## 2023-08-07 ENCOUNTER — Telehealth: Payer: Self-pay

## 2023-08-07 NOTE — Telephone Encounter (Signed)
 Copied from CRM [id number]. Topic: Clinical - Medication Prior Auth >> Aug 04, 2023 12:28 PM Delon DASEN wrote: Reason for CRM: Semaglutide ,0.25 or 0.5MG /DOS, (OZEMPIC , 0.25 OR 0.5 MG/DOSE,) 2 MG/3ML SOPN- Jen with [employer] has clinical questions- [phone]

## 2023-08-09 ENCOUNTER — Other Ambulatory Visit: Payer: Self-pay

## 2023-08-14 NOTE — Telephone Encounter (Signed)
 Contacted pharmacy about this and told her that the medication was approved but that the discounted copay was denied probably due to the money that needs to be spent in the beginning.   She ran it and stated it would be in the $300 range she was going to process it and see if the patient wanted it.

## 2023-08-17 ENCOUNTER — Telehealth: Payer: Self-pay

## 2023-08-17 NOTE — Telephone Encounter (Signed)
 Patient's husband came in the office and was demonstrated on how to turn the dial to correct dosage.

## 2023-08-17 NOTE — Telephone Encounter (Unsigned)
 Copied from CRM [id number]. Topic: Clinical - Medical Advice >> Aug 17, 2023  2:58 PM Emylou G wrote: Reason for CRM: Husband called - concerned he didn't get enough needles for her ozempic ?  Please advise on further instructions

## 2023-08-17 NOTE — Telephone Encounter (Signed)
 Patient called and she says when it was picked up from the pharmacy they told her it was no instructions on there and they dispensed enough for 56 days. Advised instructions are to give 0.25 mg weekly. She asked how is she supposed to give herself the 0.25 mg. I asked her to get the box and open it to see if the pen has a dial on it. She says there is 1 pen in the box with 6 needles and there is a dial, but when she dialed it there is no 0.25 mg on there only lines and a 1. She asked her husband to look because she had eye surgery and her vision is not clear yet. He says there is a 0 mg, then he dialed and there were lines. She asked if they could bring the vial to the office and have someone to show them. Advised to do that and if by chance the office is closed to go to the pharmacy and let them know 0.25 mg is what is needed. They both verbalized understanding.     Copied from CRM [id number]. Topic: Clinical - Medication Question >> Aug 17, 2023 11:18 AM Gustabo D wrote: Patient picked up Ozempic  today and there are no instructions on how to take it please call her back

## 2023-09-28 ENCOUNTER — Other Ambulatory Visit: Payer: Self-pay

## 2023-09-28 ENCOUNTER — Telehealth: Payer: Self-pay

## 2023-09-28 MED ORDER — INSULIN PEN NEEDLE 32G X 4 MM MISC: 10.0000 | 2 refills | Status: AC

## 2023-09-28 NOTE — Telephone Encounter (Unsigned)
 Copied from CRM [id number]. Topic: Clinical - Medication Refill >> Sep 28, 2023 12:09 PM Montie POUR wrote: Medication: Ilsa needs needles to inject Semaglutide,0.25 or 0.5MG /DOS, (OZEMPIC, 0.25 OR 0.5 MG/DOSE,) 2 MG/3ML SOPN  Has the patient contacted their pharmacy? Yes (Agent: If no, request that the patient contact the pharmacy for the refill. If patient does not wish to contact the pharmacy document the reason why and proceed with request.) (Agent: If yes, when and what did the pharmacy advise?) Pharmacy needs order   This is the patient's preferred pharmacy:  [id number] [address] [place], [place] - [address] [place] [address] Phone: [phone] Fax: [phone]  Is this the correct pharmacy for this prescription? Yes If no, delete pharmacy and type the correct one.   Has the prescription been filled recently? No  Is the patient out of the medication? Yes  Has the patient been seen for an appointment in the last year OR does the patient have an upcoming appointment? Yes  Can we respond through MyChart? Yes  Agent: Please be advised that Rx refills may take up to 3 business days. We ask that you follow-up with your pharmacy.

## 2023-10-20 ENCOUNTER — Other Ambulatory Visit: Payer: Self-pay

## 2023-10-20 DIAGNOSIS — I10 Essential (primary) hypertension: Secondary | ICD-10-CM

## 2023-10-20 DIAGNOSIS — E1169 Type 2 diabetes mellitus with other specified complication: Secondary | ICD-10-CM

## 2023-10-20 DIAGNOSIS — E039 Hypothyroidism, unspecified: Secondary | ICD-10-CM

## 2023-10-24 ENCOUNTER — Other Ambulatory Visit

## 2023-10-24 DIAGNOSIS — E1169 Type 2 diabetes mellitus with other specified complication: Secondary | ICD-10-CM

## 2023-10-24 DIAGNOSIS — E039 Hypothyroidism, unspecified: Secondary | ICD-10-CM

## 2023-10-24 DIAGNOSIS — I10 Essential (primary) hypertension: Secondary | ICD-10-CM

## 2023-10-25 LAB — COMPREHENSIVE METABOLIC PANEL WITH GFR
ALT: 21 IU/L (ref 0–32)
AST: 19 IU/L (ref 0–40)
Albumin: 4 g/dL (ref 3.9–4.9)
Alkaline Phosphatase: 62 IU/L (ref 44–121)
BUN/Creatinine Ratio: 29 — ABNORMAL HIGH (ref 12–28)
BUN: 23 mg/dL (ref 8–27)
Bilirubin Total: 0.3 mg/dL (ref 0.0–1.2)
CO2: 21 mmol/L (ref 20–29)
Calcium: 9.5 mg/dL (ref 8.7–10.3)
Chloride: 103 mmol/L (ref 96–106)
Creatinine, Ser: 0.8 mg/dL (ref 0.57–1.00)
Globulin, Total: 2.4 g/dL (ref 1.5–4.5)
Glucose: 76 mg/dL (ref 70–99)
Potassium: 4.6 mmol/L (ref 3.5–5.2)
Sodium: 137 mmol/L (ref 134–144)
Total Protein: 6.4 g/dL (ref 6.0–8.5)
eGFR: 81 mL/min/1.73 (ref >59)

## 2023-10-25 LAB — CBC WITH DIFFERENTIAL/PLATELET
Basophils Absolute: 0.1 x10E3/uL (ref 0.0–0.2)
Basos: 1 %
EOS (ABSOLUTE): 0.7 x10E3/uL — ABNORMAL HIGH (ref 0.0–0.4)
Eos: 7 %
Hematocrit: 50.5 % — ABNORMAL HIGH (ref 34.0–46.6)
Hemoglobin: 16.1 g/dL — ABNORMAL HIGH (ref 11.1–15.9)
Immature Grans (Abs): 0 x10E3/uL (ref 0.0–0.1)
Immature Granulocytes: 0 %
Lymphocytes Absolute: 2.3 x10E3/uL (ref 0.7–3.1)
Lymphs: 24 %
MCH: 31 pg (ref 26.6–33.0)
MCHC: 31.9 g/dL (ref 31.5–35.7)
MCV: 97 fL (ref 79–97)
Monocytes Absolute: 0.8 x10E3/uL (ref 0.1–0.9)
Monocytes: 9 %
Neutrophils Absolute: 5.7 x10E3/uL (ref 1.4–7.0)
Neutrophils: 59 %
Platelets: 203 x10E3/uL (ref 150–450)
RBC: 5.19 x10E6/uL (ref 3.77–5.28)
RDW: 13.9 % (ref 11.7–15.4)
WBC: 9.6 x10E3/uL (ref 3.4–10.8)

## 2023-10-25 LAB — LIPID PANEL
Chol/HDL Ratio: 2.7 ratio (ref 0.0–4.4)
Cholesterol, Total: 92 mg/dL — ABNORMAL LOW (ref 100–199)
HDL: 34 mg/dL — ABNORMAL LOW (ref >39)
LDL Chol Calc (NIH): 44 mg/dL (ref 0–99)
Triglycerides: 59 mg/dL (ref 0–149)
VLDL Cholesterol Cal: 14 mg/dL (ref 5–40)

## 2023-10-25 LAB — HEMOGLOBIN A1C
Est. average glucose Bld gHb Est-mCnc: 131 mg/dL
Hgb A1c MFr Bld: 6.2 % — ABNORMAL HIGH (ref 4.8–5.6)

## 2023-10-25 LAB — TSH: TSH: 1.35 u[IU]/mL (ref 0.450–4.500)

## 2023-10-29 ENCOUNTER — Ambulatory Visit: Payer: Self-pay

## 2023-10-31 ENCOUNTER — Ambulatory Visit

## 2023-10-31 ENCOUNTER — Ambulatory Visit (INDEPENDENT_AMBULATORY_CARE_PROVIDER_SITE_OTHER)

## 2023-10-31 VITALS — BP 138/79 | HR 87 | Temp 97.8°F | Ht 64.0 in | Wt 136.1 lb

## 2023-10-31 DIAGNOSIS — Z0000 Encounter for general adult medical examination without abnormal findings: Secondary | ICD-10-CM | POA: Diagnosis not present

## 2023-10-31 DIAGNOSIS — E039 Hypothyroidism, unspecified: Secondary | ICD-10-CM

## 2023-10-31 DIAGNOSIS — E1159 Type 2 diabetes mellitus with other circulatory complications: Secondary | ICD-10-CM

## 2023-10-31 DIAGNOSIS — I152 Hypertension secondary to endocrine disorders: Secondary | ICD-10-CM | POA: Diagnosis not present

## 2023-10-31 DIAGNOSIS — J3089 Other allergic rhinitis: Secondary | ICD-10-CM

## 2023-10-31 DIAGNOSIS — E782 Mixed hyperlipidemia: Secondary | ICD-10-CM

## 2023-10-31 DIAGNOSIS — E1169 Type 2 diabetes mellitus with other specified complication: Secondary | ICD-10-CM | POA: Diagnosis not present

## 2023-10-31 DIAGNOSIS — D582 Other hemoglobinopathies: Secondary | ICD-10-CM

## 2023-10-31 DIAGNOSIS — G8321 Monoplegia of upper limb affecting right dominant side: Secondary | ICD-10-CM

## 2023-10-31 MED ORDER — PANTOPRAZOLE SODIUM 40 MG PO TBEC: 40.0000 mg | DELAYED_RELEASE_TABLET | Freq: Every day | ORAL | 2 refills | Status: DC

## 2023-10-31 MED ORDER — FLUTICASONE PROPIONATE 50 MCG/ACT NA SUSP: NASAL | 0 refills | Status: DC

## 2023-10-31 NOTE — Patient Instructions (Signed)
 VISIT SUMMARY: Today, you had a follow-up appointment to manage your hypertension, hyperlipidemia, type 2 diabetes, thyroid  dysfunction, fatigue, allergic rhinitis, and gastroesophageal reflux disease. We reviewed your current medications and discussed adjustments to better manage your symptoms and improve your overall health.  YOUR PLAN: -TYPE 2 DIABETES MELLITUS: Your diabetes is well-controlled with your current medication, Ozempic , which has significantly improved your A1c levels. Continue taking Ozempic  1.25 mg weekly. Monitor your appetite and let us  know if the side effects worsen or if the medication becomes less effective.  -GASTROESOPHAGEAL REFLUX DISEASE: You have acid reflux, which is worsened by your diabetes medication, Ozempic . To manage this, you will start taking pantoprazole  once daily in the morning and continue taking famotidine at night. This combination should help control your symptoms.  -HYPOTHYROIDISM: You have fluctuating thyroid  levels and persistent fatigue. We will refer you to an endocrinologist for specialized thyroid  management. We will also review your recent lab results.  -FATIGUE AND POLYCYTHEMIA: Your fatigue may be due to multiple factors, including elevated red blood cell counts. We will order iron studies, ferritin, and a test to rule out polycythemia. If fatigue continues, a sleep study may be considered. We will also monitor your thyroid  function and adjust your treatment as needed.  -ESSENTIAL HYPERTENSION: Your blood pressure is controlled with atenolol , but you have experienced fatigue and low heart rate. You should monitor your heart rate at home, especially during episodes of fatigue, and keep a log of your readings. We will reassess your atenolol  dosage based on these readings.  -HYPERLIPIDEMIA: Your cholesterol levels are well-controlled. We will reduce your atorvastatin  dose by splitting your 10 mg tablet in half to 5 mg. We will recheck your cholesterol  levels in a couple of months to ensure they remain stable.  -ALLERGIC RHINITIS WITH EUSTACHIAN TUBE DYSFUNCTION: You have nasal congestion and eustachian tube dysfunction due to allergies. Continue using Flonase  and azelastine . Avoid daily nasal lavage as it caused irritation. Rotate your antihistamines monthly to prevent tolerance.  INSTRUCTIONS: Please monitor your heart rate at home and keep a log of your readings, especially during episodes of fatigue. We will reassess your atenolol  dosage based on these readings. Additionally, we will recheck your cholesterol levels in a couple of months to ensure they remain stable. Follow up with the endocrinologist for your thyroid  management and complete the recommended lab tests for iron studies, ferritin, and hemochromatosis gene test.  If you have any problems before your next visit feel free to message me via MyChart (minor issues or questions) or call the office, otherwise you may reach out to schedule an office visit.  Thank you! Saddie Sacks, PA-C

## 2023-11-01 DIAGNOSIS — G8321 Monoplegia of upper limb affecting right dominant side: Secondary | ICD-10-CM | POA: Insufficient documentation

## 2023-11-01 LAB — JAK2 V617F RFX CALR/MPL/E12-15

## 2023-11-01 NOTE — Assessment & Plan Note (Signed)
 Hgb and Hct consistently elevated for about 5 years. EPO and peripheral smear normal. Ordering an iron panel and Jak2 studies today. Would recommend referral to hematology pending the results.

## 2023-11-01 NOTE — Progress Notes (Signed)
 Complete physical exam  Patient: Dominique Brock   DOB: 1955-12-14   68 y.o. Female  MRN: [id number]  Subjective:    Chief Complaint  Patient presents with   Medical Management of Chronic Issues    History of Present Illness   Dominique Brock is a 68 year old female with hypertension, hyperlipidemia, and type 2 diabetes who presents for routine follow-up and medication management.  Hypertension - Takes atenolol  25 mg daily for blood pressure control - Does not regularly monitor blood pressure at home - Has experienced low heart rate readings in the past - Feels tired upon waking, attributing this to either atenolol  or poor sleep quality  Type 2 diabetes mellitus - Currently taking Ozempic  0.25 mg weekly for the past 12 weeks - A1c decreased from 9.1 three months ago to 6.2 - Morning blood glucose readings consistently under 100 mg/dL - Previously tried metformin  and Trulicity , but discontinued due to insurance issues - Experiences side effects from Ozempic , including acid reflux and occasional nausea, particularly on the day of injection  Hyperlipidemia - Takes atorvastatin  10 mg daily for lipid management  Thyroid  dysfunction - History of fluctuating T3 levels - Currently taking thyroid  medication at 60 mg daily - Previous increase to 90 mg resulted in hyperthyroid symptoms, prompting return to 60 mg - Thyroid  managed by Paris Harvest MD, however patient requesting to see endocrinologist for management instead.  Allergic rhinitis - Uses azelastine  and Flonase  for allergy management - Alternates between different antihistamines monthly to prevent tolerance - Allergies contribute to nasal congestion and ear pain, impacting sleep quality  Gastroesophageal reflux and upper gastrointestinal symptoms - History of hiatal hernia and possible gastritis - Experiences acid reflux, particularly after Ozempic  injection - Manages symptoms with famotidine  Hematologic abnormalities -  History of elevated red blood cell counts - Family history of hemochromatosis - Has not yet seen a hematologist due to personal circumstances, including her husband's recent surgeries      Most recent fall risk assessment:    10/31/2023    1:55 PM  Fall Risk   Falls in the past year? 0  Risk for fall due to : No Fall Risks  Follow up Falls evaluation completed     Most recent depression screenings:    10/31/2023    1:55 PM 07/31/2023    3:10 PM  PHQ 2/9 Scores  PHQ - 2 Score 0 0  PHQ- 9 Score 2 1    Vision:Within last year and Dental: No current dental problems and Receives regular dental care    Patient Care Team: Gayle Dominique JULIANNA DEVONNA as PCP - General (Physician Assistant) Bobbitt, Elgin Pepper, MD as Consulting Physician (Allergy and Immunology) Rosalynn LELON Ingle, MD (Inactive) as Consulting Physician (Obstetrics and Gynecology) Cleotilde Elspeth CROME, OD (Optometry)   Outpatient Medications Prior to Visit  Medication Sig   ARMOUR THYROID  30 MG tablet Take 30 mg by mouth daily.   ARMOUR THYROID  60 MG tablet TAKE 1 TABLET BY MOUTH EVERY DAY ON EMPTY STOMACH FOR 90 DAYS   atenolol  (TENORMIN ) 25 MG tablet TAKE 1 TABLET (25 MG TOTAL) BY MOUTH DAILY.   atorvastatin  (LIPITOR) 10 MG tablet TAKE 1 TABLET (10 MG TOTAL) BY MOUTH DAILY. **CALL TO SCHEDULE A FOLLOW UP FOR FUTURE MED REFILLS**   Azelastine  HCl 137 MCG/SPRAY SOLN PLACE 1 SPRAY INTO BOTH NOSTRILS 2 (TWO) TIMES DAILY. USE IN EACH NOSTRIL AS DIRECTED   cholecalciferol (VITAMIN D3) 25 MCG (1000 UT) tablet Take  1,000 Units by mouth daily.   dorzolamide-timolol (COSOPT) 2-0.5 % ophthalmic solution Place 1 drop into the right eye 2 (two) times daily.   EPINEPHrine  0.3 mg/0.3 mL IJ SOAJ injection Inject 0.3 mg into the muscle as needed for anaphylaxis.   Insulin  Pen Needle 32G X 4 MM MISC 10 each by Does not apply route once a week.   Multiple Vitamin (MULTIVITAMIN) tablet Take 1 tablet by mouth daily.   progesterone 200 MG SUPP Place  100 mg vaginally at bedtime.   Semaglutide ,0.25 or 0.5MG /DOS, (OZEMPIC , 0.25 OR 0.5 MG/DOSE,) 2 MG/3ML SOPN Inject 0.25 mg into the skin once a week.   [DISCONTINUED] clindamycin (CLEOCIN) 150 MG capsule TAKE 1 CAPSULE BY MOUTH 4 TIMES DAILY FOR INFECTION   [DISCONTINUED] Continuous Blood Gluc Sensor (DEXCOM G6 SENSOR) MISC Use as directed.   [DISCONTINUED] Continuous Blood Gluc Transmit (DEXCOM G6 TRANSMITTER) MISC Used as directed.   [DISCONTINUED] fluticasone  (FLONASE ) 50 MCG/ACT nasal spray USE 2 SPRAY(S) IN EACH NOSTRIL ONCE DAILY   [DISCONTINUED] metroNIDAZOLE (METROCREAM) 0.75 % cream Apply 1 Application topically 2 (two) times daily.   [DISCONTINUED] omeprazole  (PRILOSEC) 20 MG capsule TAKE 1 CAPSULE BY MOUTH EVERY DAY   [DISCONTINUED] Omeprazole  Magnesium (PRILOSEC PO)    [DISCONTINUED] pregabalin  (LYRICA ) 25 MG capsule Take 1 capsule (25 mg total) by mouth 3 (three) times daily as needed.   [DISCONTINUED] spironolactone (ALDACTONE) 25 MG tablet Take 25 mg by mouth daily.   No facility-administered medications prior to visit.    ROS   Per HPI     Objective:     BP 138/79   Pulse 87   Temp 97.8 F (36.6 C) (Oral)   Ht 5' 4 (1.626 m)   Wt 136 lb 1.3 oz (61.7 kg)   SpO2 99%   BMI 23.36 kg/m    Physical Exam Constitutional:      General: She is not in acute distress.    Appearance: Normal appearance.  HENT:     Right Ear: Tympanic membrane normal.     Left Ear: Tympanic membrane normal.     Mouth/Throat:     Mouth: Mucous membranes are moist.     Pharynx: Oropharynx is clear.  Eyes:     Pupils: Pupils are equal, round, and reactive to light.  Cardiovascular:     Rate and Rhythm: Normal rate and regular rhythm.     Heart sounds: Normal heart sounds. No murmur heard.    No friction rub. No gallop.  Pulmonary:     Effort: Pulmonary effort is normal. No respiratory distress.     Breath sounds: Normal breath sounds.  Abdominal:     General: Abdomen is flat.  Bowel sounds are normal.     Palpations: Abdomen is soft.  Musculoskeletal:        General: No swelling.     Cervical back: Normal range of motion.  Lymphadenopathy:     Cervical: No cervical adenopathy.  Skin:    General: Skin is warm and dry.  Neurological:     General: No focal deficit present.     Mental Status: She is alert.  Psychiatric:        Mood and Affect: Mood normal.        Behavior: Behavior normal.        Thought Content: Thought content normal.       Results for orders placed or performed in visit on 10/31/23  JAK2 V617F rfx CALR/MPL/E12-15  Result Value Ref Range  Specimen Type Comment:    JAK2 V617F Result WILL FOLLOW    JAK2 V617F % WILL FOLLOW    Reflex WILL FOLLOW    V617F Rfx CALR/MPL/E12-15 Bkgd Comment    Method Comment    References Comment    Director Review WILL FOLLOW   Iron, TIBC and Ferritin Panel  Result Value Ref Range   Total Iron Binding Capacity 294 250 - 450 ug/dL   UIBC 740 881 - 630 ug/dL   Iron 35 27 - 860 ug/dL   Iron Saturation 12 (L) 15 - 55 %   Ferritin WILL FOLLOW    Last CBC Lab Results  Component Value Date   WBC 9.6 10/24/2023   HGB 16.1 (H) 10/24/2023   HCT 50.5 (H) 10/24/2023   MCV 97 10/24/2023   MCH 31.0 10/24/2023   RDW 13.9 10/24/2023   PLT 203 10/24/2023   Last metabolic panel Lab Results  Component Value Date   GLUCOSE 76 10/24/2023   NA 137 10/24/2023   K 4.6 10/24/2023   CL 103 10/24/2023   CO2 21 10/24/2023   BUN 23 10/24/2023   CREATININE 0.80 10/24/2023   EGFR 81 10/24/2023   CALCIUM  9.5 10/24/2023   PROT 6.4 10/24/2023   ALBUMIN 4.0 10/24/2023   LABGLOB 2.4 10/24/2023   AGRATIO 1.7 06/22/2022   BILITOT 0.3 10/24/2023   ALKPHOS 62 10/24/2023   AST 19 10/24/2023   ALT 21 10/24/2023   ANIONGAP 7 05/30/2023   Last lipids Lab Results  Component Value Date   CHOL 92 (L) 10/24/2023   HDL 34 (L) 10/24/2023   LDLCALC 44 10/24/2023   TRIG 59 10/24/2023   CHOLHDL 2.7 10/24/2023   Last  hemoglobin A1c Lab Results  Component Value Date   HGBA1C 6.2 (H) 10/24/2023   Last thyroid  functions Lab Results  Component Value Date   TSH 1.350 10/24/2023   THYROIDAB <1 03/11/2016   Last vitamin D  Lab Results  Component Value Date   VD25OH 83.0 04/07/2022        Assessment & Plan:    Routine Health Maintenance and Physical Exam  Health Maintenance  Topic Date Due   Hepatitis C Screening  Never done   DTaP/Tdap/Td vaccine (2 - Td or Tdap) 05/20/2021   Eye exam for diabetics  09/11/2023   COVID-19 Vaccine (3 - 2025-26 season) 10/16/2023   Medicare Annual Wellness Visit  12/30/2023   Flu Shot  05/14/2024*   Hemoglobin A1C  04/22/2024   Yearly kidney health urinalysis for diabetes  07/30/2024   Complete foot exam   07/30/2024   Yearly kidney function blood test for diabetes  10/23/2024   Breast Cancer Screening  10/25/2024   Colon Cancer Screening  08/14/2031   Pneumococcal Vaccine for age over 30  Completed   DEXA scan (bone density measurement)  Completed   Zoster (Shingles) Vaccine  Completed   HPV Vaccine  Aged Out   Meningitis B Vaccine  Aged Out  *Topic was postponed. The date shown is not the original due date.    Discussed health benefits of physical activity, and encouraged her to engage in regular exercise appropriate for her age and condition.  Wellness examination  Hypertension associated with diabetes (HCC) Assessment & Plan: BP goal 130/80. Blood pressure controlled with atenolol . Fatigue and low heart rate noted. - Monitor heart rate at home during fatigue episodes. - Provide log for heart rate monitoring. - Reassess atenolol  dosage based on home heart rate readings.  Hypothyroidism, unspecified type Assessment & Plan: Fluctuating T3 levels. Fatigue present. Endocrinology consultation desired. - Refer to endocrinology for thyroid  management. - Review [facility] lab results and scan into chart. - Most recent TSH and T3 WNL. -Continue 90 mg  Armour thyroid  daily.   Orders: -     Ambulatory referral to Endocrinology  Type 2 diabetes mellitus with other specified complication, without long-term current use of insulin  (HCC) Assessment & Plan: Diabetes well-controlled with Ozempic . A1c improved to 6.2%. - Continue Ozempic  0.25 mg weekly. - Monitor appetite suppression. - Consider dose adjustment if side effects worsen or efficacy decreases.  Orders: -     Fluticasone  Propionate; Use 2 spray(s) in each nostril once daily  Dispense: 16 each; Refill: 0  Mixed diabetic hyperlipidemia associated with type 2 diabetes mellitus (HCC) Assessment & Plan: Last lipid panel: LDL 44, HDL 34, Trig 59. The ASCVD Risk score (Arnett DK, et al., 2019) failed to calculate for the following reasons:   The valid total cholesterol range is 130 to 320 mg/dL Cholesterol well-controlled. Plan to reduce atorvastatin  dose. - Split atorvastatin  10 mg tablet in half to 5 mg. - Recheck cholesterol levels in a couple of months.   Environmental and seasonal allergies Assessment & Plan: Nasal congestion and eustachian tube dysfunction present. Nasal lavage caused irritation. - Refill Flonase  and use with azelastine . - Advise against daily nasal lavage. - Rotate antihistamines monthly to prevent tolerance.      Orders: -     Fluticasone  Propionate; Use 2 spray(s) in each nostril once daily  Dispense: 16 each; Refill: 0  Elevated hemoglobin (HCC) Assessment & Plan: Hgb and Hct consistently elevated for about 5 years. EPO and peripheral smear normal. Ordering an iron panel and Jak2 studies today. Would recommend referral to hematology pending the results.  Orders: -     Iron, TIBC and Ferritin Panel; Future -     JAK2 V617F rfx CALR/MPL/E12-15; Future  Paralysis of right hand (HCC)  Other orders -     Pantoprazole  Sodium; Take 1 tablet (40 mg total) by mouth daily.  Dispense: 30 tablet; Refill: 2  No follow-ups on file.     Dominique JULIANNA Sacks,  Dominique Brock

## 2023-11-01 NOTE — Assessment & Plan Note (Signed)
 Diabetes well-controlled with Ozempic . A1c improved to 6.2%. - Continue Ozempic  0.25 mg weekly. - Monitor appetite suppression. - Consider dose adjustment if side effects worsen or efficacy decreases.

## 2023-11-01 NOTE — Assessment & Plan Note (Signed)
 BP goal 130/80. Blood pressure controlled with atenolol . Fatigue and low heart rate noted. - Monitor heart rate at home during fatigue episodes. - Provide log for heart rate monitoring. - Reassess atenolol  dosage based on home heart rate readings.

## 2023-11-01 NOTE — Assessment & Plan Note (Signed)
 Nasal congestion and eustachian tube dysfunction present. Nasal lavage caused irritation. - Refill Flonase  and use with azelastine . - Advise against daily nasal lavage. - Rotate antihistamines monthly to prevent tolerance.

## 2023-11-01 NOTE — Assessment & Plan Note (Addendum)
 Fluctuating T3 levels. Fatigue present. Endocrinology consultation desired. - Refer to endocrinology for thyroid  management. - Review [facility] lab results and scan into chart. - Most recent TSH and T3 WNL. -Continue 90 mg Armour thyroid  daily.

## 2023-11-01 NOTE — Assessment & Plan Note (Signed)
 Last lipid panel: LDL 44, HDL 34, Trig 59. The ASCVD Risk score (Arnett DK, et al., 2019) failed to calculate for the following reasons:   The valid total cholesterol range is 130 to 320 mg/dL Cholesterol well-controlled. Plan to reduce atorvastatin  dose. - Split atorvastatin  10 mg tablet in half to 5 mg. - Recheck cholesterol levels in a couple of months.

## 2023-11-06 ENCOUNTER — Other Ambulatory Visit: Payer: Self-pay | Admitting: Obstetrics and Gynecology

## 2023-11-06 DIAGNOSIS — R928 Other abnormal and inconclusive findings on diagnostic imaging of breast: Secondary | ICD-10-CM

## 2023-11-08 ENCOUNTER — Ambulatory Visit: Payer: Self-pay

## 2023-11-08 DIAGNOSIS — D582 Other hemoglobinopathies: Secondary | ICD-10-CM

## 2023-11-08 LAB — IRON,TIBC AND FERRITIN PANEL
Ferritin: 55 ng/mL (ref 15–150)
Iron Saturation: 12 % — ABNORMAL LOW (ref 15–55)
Iron: 35 ug/dL (ref 27–139)
Total Iron Binding Capacity: 294 ug/dL (ref 250–450)
UIBC: 259 ug/dL (ref 118–369)

## 2023-11-08 LAB — CALR +MPL + E12-E15  (REFLEX)

## 2023-11-08 LAB — JAK2 V617F RFX CALR/MPL/E12-15

## 2023-11-14 ENCOUNTER — Ambulatory Visit
Admission: RE | Admit: 2023-11-14 | Discharge: 2023-11-14 | Disposition: A | Source: Ambulatory Visit | Attending: Obstetrics and Gynecology | Admitting: Obstetrics and Gynecology

## 2023-11-14 ENCOUNTER — Ambulatory Visit
Admission: RE | Admit: 2023-11-14 | Discharge: 2023-11-14 | Disposition: A | Discharge status: Home / Self Care | Source: Ambulatory Visit | Attending: Obstetrics and Gynecology | Admitting: Obstetrics and Gynecology

## 2023-11-14 ENCOUNTER — Other Ambulatory Visit: Payer: Self-pay | Admitting: Obstetrics and Gynecology

## 2023-11-14 DIAGNOSIS — R928 Other abnormal and inconclusive findings on diagnostic imaging of breast: Secondary | ICD-10-CM

## 2023-11-14 DIAGNOSIS — N6489 Other specified disorders of breast: Secondary | ICD-10-CM

## 2023-11-28 ENCOUNTER — Other Ambulatory Visit: Payer: Self-pay

## 2023-11-28 DIAGNOSIS — J3089 Other allergic rhinitis: Secondary | ICD-10-CM

## 2023-11-28 DIAGNOSIS — E1169 Type 2 diabetes mellitus with other specified complication: Secondary | ICD-10-CM

## 2023-12-02 ENCOUNTER — Other Ambulatory Visit: Payer: Self-pay

## 2023-12-05 ENCOUNTER — Other Ambulatory Visit: Payer: Self-pay | Admitting: Family Medicine

## 2023-12-05 DIAGNOSIS — E1169 Type 2 diabetes mellitus with other specified complication: Secondary | ICD-10-CM

## 2024-01-09 ENCOUNTER — Inpatient Hospital Stay: Attending: Oncology | Admitting: Oncology

## 2024-01-09 ENCOUNTER — Inpatient Hospital Stay

## 2024-01-09 ENCOUNTER — Encounter: Payer: Self-pay | Admitting: Oncology

## 2024-01-09 VITALS — BP 137/84 | HR 71 | Temp 97.8°F | Resp 15 | Ht 64.0 in | Wt 137.1 lb

## 2024-01-09 DIAGNOSIS — I10 Essential (primary) hypertension: Secondary | ICD-10-CM | POA: Diagnosis not present

## 2024-01-09 DIAGNOSIS — D751 Secondary polycythemia: Secondary | ICD-10-CM | POA: Insufficient documentation

## 2024-01-09 DIAGNOSIS — E1159 Type 2 diabetes mellitus with other circulatory complications: Secondary | ICD-10-CM | POA: Diagnosis not present

## 2024-01-09 DIAGNOSIS — R718 Other abnormality of red blood cells: Secondary | ICD-10-CM

## 2024-01-09 DIAGNOSIS — Z79899 Other long term (current) drug therapy: Secondary | ICD-10-CM | POA: Insufficient documentation

## 2024-01-09 DIAGNOSIS — Z808 Family history of malignant neoplasm of other organs or systems: Secondary | ICD-10-CM | POA: Diagnosis not present

## 2024-01-09 LAB — CMP (CANCER CENTER ONLY)
ALT: 16 U/L (ref 0–44)
AST: 18 U/L (ref 15–41)
Albumin: 3.9 g/dL (ref 3.5–5.0)
Alkaline Phosphatase: 60 U/L (ref 38–126)
Anion gap: 9 (ref 5–15)
BUN: 26 mg/dL — ABNORMAL HIGH (ref 8–23)
CO2: 25 mmol/L (ref 22–32)
Calcium: 9.7 mg/dL (ref 8.9–10.3)
Chloride: 103 mmol/L (ref 98–111)
Creatinine: 0.81 mg/dL (ref 0.44–1.00)
GFR, Estimated: >60 mL/min (ref >60)
Glucose, Bld: 110 mg/dL — ABNORMAL HIGH (ref 70–99)
Potassium: 4.2 mmol/L (ref 3.5–5.1)
Sodium: 137 mmol/L (ref 135–145)
Total Bilirubin: 0.4 mg/dL (ref 0.0–1.2)
Total Protein: 7 g/dL (ref 6.5–8.1)

## 2024-01-09 LAB — IRON AND TIBC
Iron: 79 ug/dL (ref 28–170)
Saturation Ratios: 22 % (ref 10.4–31.8)
TIBC: 357 ug/dL (ref 250–450)
UIBC: 278 ug/dL

## 2024-01-09 LAB — CBC WITH DIFFERENTIAL (CANCER CENTER ONLY)
Abs Immature Granulocytes: 0.02 K/uL (ref 0.00–0.07)
Basophils Absolute: 0.1 K/uL (ref 0.0–0.1)
Basophils Relative: 1 %
Eosinophils Absolute: 0.2 K/uL (ref 0.0–0.5)
Eosinophils Relative: 2 %
HCT: 45.5 % (ref 36.0–46.0)
Hemoglobin: 15.4 g/dL — ABNORMAL HIGH (ref 12.0–15.0)
Immature Granulocytes: 0 %
Lymphocytes Relative: 27 %
Lymphs Abs: 2.6 K/uL (ref 0.7–4.0)
MCH: 31.9 pg (ref 26.0–34.0)
MCHC: 33.8 g/dL (ref 30.0–36.0)
MCV: 94.2 fL (ref 80.0–100.0)
Monocytes Absolute: 0.7 K/uL (ref 0.1–1.0)
Monocytes Relative: 7 %
Neutro Abs: 6.1 K/uL (ref 1.7–7.7)
Neutrophils Relative %: 63 %
Platelet Count: 208 K/uL (ref 150–400)
RBC: 4.83 MIL/uL (ref 3.87–5.11)
RDW: 13.1 % (ref 11.5–15.5)
WBC Count: 9.7 K/uL (ref 4.0–10.5)
nRBC: 0 % (ref 0.0–0.2)

## 2024-01-09 LAB — LACTATE DEHYDROGENASE: LDH: 143 U/L (ref 105–235)

## 2024-01-09 LAB — FERRITIN: Ferritin: 58 ng/mL (ref 11–307)

## 2024-01-09 NOTE — Progress Notes (Signed)
 Dominique [facility]  HEMATOLOGY CLINIC CONSULTATION NOTE    PATIENT NAME: Dominique [facility]   MR#: [id number] DOB: 11-17-1955  DATE OF SERVICE: 01/09/2024   REFERRING PHYSICIAN  Gayle Saddie FALCON, [place]   Patient Care Team: Gayle Saddie FALCON DEVONNA as PCP - General (Physician Assistant) Bobbitt, Elgin Pepper, MD as Consulting Physician (Allergy and Immunology) Rosalynn LELON Ingle, MD (Inactive) as Consulting Physician (Obstetrics and Gynecology) Cleotilde Elspeth CROME, OD (Optometry)    REASON FOR CONSULTATION/ CHIEF COMPLAINT: Polycythemia  ASSESSMENT & PLAN:  [name] is a 68 y.o. lady with past medical history of hypertension, hypothyroidism, diabetes mellitus, dyslipidemia, ?obstructive sleep apnea, was referred to our clinic for evaluation of polycythemia.  Polycythemia On 10/23/2021, labs at her PCPs office showed hemoglobin of 16.1, hematocrit of 50.5.  White count and platelet count were within normal limits.  She had additional testing on 10/31/2023. JAK2 V617F mutation analysis, followed by reflex testing to include CALR, MPL, exon 12-15 mutations were all negative.  Iron studies showed iron saturation of 12%, normal iron level.  Ferritin was normal at 55.  She was referred to us  for further evaluation of polycythemia.  She has experienced elevated hemoglobin levels for the past few years, with values recorded at 16.8 g/dL in January 2020, 83.3 g/dL in 7977, and 83.8 g/dL in September 7974. Her hematocrit was noted to be 50% in the last test.  Since JAK2 testing with extended panel testing was negative, no concern for primary myeloproliferative neoplasm.  Discussed secondary causes of polycythemia including testosterone supplementation, obstructive sleep apnea, early erythropoietin  producing kidney tumors.  She does get testosterone supplementation, along with estrogen via implants every 3 months for her menopausal symptoms.  This could be contributing to her mild  polycythemia.  She does not have any symptoms suggestive of erythromelalgia.  Given secondary polycythemia, goal would be to maintain hematocrit below 55 to avoid hyperviscosity related complications.  Labs today showed improved hemoglobin of 15.4, hematocrit of 45.5.  White count and platelet count are within normal limits.  We did check erythropoietin  level today.  She was provided reassurance.  Currently no indication for therapeutic phlebotomies or other interventions.  RTC in 4 months for follow-up with repeat labs.  If labs are stable, she can be discharged from our office after next visit for continued follow-up with her PCP.   I reviewed lab results and outside records for this visit and discussed relevant results with the patient. Diagnosis, plan of care and treatment options were also discussed in detail with the patient. Opportunity provided to ask questions and answers provided to her apparent satisfaction. Provided instructions to call our clinic with any problems, questions or concerns prior to return visit. I recommended to continue follow-up with PCP and sub-specialists. She verbalized understanding and agreed with the plan. No barriers to learning was detected.  Chinita Patten, MD  01/09/2024 11:30 AM   [facility] - A DEPT OF [facility] [address] [place] [address] Dept: [phone] Dept Fax: [phone]   HISTORY OF PRESENTING ILLNESS:   Discussed the use of AI scribe software for clinical note transcription with the patient, who gave verbal consent to proceed.  History of Present Illness Dominique Brock is a 68 year old female who presents with high red blood cell count. She was referred by her primary care doctor for evaluation of high red blood cell count.  On 10/23/2021, labs at  her PCPs office showed hemoglobin of 16.1, hematocrit of 50.5.  White count and platelet count were within  normal limits.  She had additional testing on 10/31/2023. JAK2 V617F mutation analysis, followed by reflex testing to include CALR, MPL, exon 12-15 mutations were all negative.  Iron studies showed iron saturation of 12%, normal iron level.  Ferritin was normal at 55.  She was referred to us  for further evaluation of polycythemia.  She has experienced elevated hemoglobin levels for the past few years, with values recorded at 16.8 g/dL in January 2020, 83.3 g/dL in 7977, and 83.8 g/dL in September 7974. Her hematocrit was noted to be 50% in the last test.  She is currently receiving hormone therapy from [facility], which includes estrogen and testosterone inserts every three months. This therapy is monitored with blood work to check hormone levels. She is also on Ozempic  for type 2 diabetes management, which has helped bring her A1c down. She initially lost 10 pounds on Ozempic  but has since gained back 2 pounds. She is on a dose of 0.25 mg.  No unusual weight loss or lack of appetite.  She denies shortness of breath on exertion, dizziness, frequent leg cramps or chest pain.  She does get intermittent headaches from sinusitis.  She denies any history of blood clot.    MEDICAL HISTORY:  Past Medical History:  Diagnosis Date   Asthma    Diabetes mellitus    prediabetic diet controlled   Eczema 06/15/2016   Hypertension    Hypothyroidism    Sleep apnea    only with allergies    SURGICAL HISTORY: Past Surgical History:  Procedure Laterality Date   ABDOMINAL HYSTERECTOMY     BILATERAL OOPHORECTOMY     dental     EXCISION MASS UPPER EXTREMETIES Left 04/18/2019   Procedure: Excision of left arm mass / lipoma;  Surgeon: Lowery Estefana RAMAN, DO;  Location: [facility];  Service: Plastics;  Laterality: Left;   SINUS EXPLORATION  1996    SOCIAL HISTORY: She reports that she has never smoked. She has never been exposed to tobacco smoke. She has never used smokeless tobacco. She  reports current alcohol use. She reports that she does not use drugs.  Social History   Socioeconomic History   Marital status: Married    Spouse name: Not on file   Number of children: 2   Years of education: Not on file   Highest education level: 12th grade  Occupational History   Not on file  Tobacco Use   Smoking status: Never    Passive exposure: Never   Smokeless tobacco: Never  Vaping Use   Vaping status: Never Used  Substance and Sexual Activity   Alcohol use: Yes    Comment: less than once a month   Drug use: No   Sexual activity: Yes  Other Topics Concern   Not on file  Social History Narrative   Not on file   Social Drivers of Health   Financial Resource Strain: Low Risk  (07/30/2023)   Overall Financial Resource Strain (CARDIA)    Difficulty of Paying Living Expenses: Not very hard  Food Insecurity: No Food Insecurity (01/09/2024)   Hunger Vital Sign    Worried About Running Out of Food in the Last Year: Never true    Ran Out of Food in the Last Year: Never true  Transportation Needs: No Transportation Needs (01/09/2024)   PRAPARE - Administrator, Civil Service (Medical):  No    Lack of Transportation (Non-Medical): No  Physical Activity: Inactive (07/30/2023)   Exercise Vital Sign    Days of Exercise per Week: 0 days    Minutes of Exercise per Session: Not on file  Stress: Stress Concern Present (07/30/2023)   [employer] of Occupational Health - Occupational Stress Questionnaire    Feeling of Stress: Very much  Social Connections: Moderately Integrated (07/30/2023)   Social Connection and Isolation Panel    Frequency of Communication with Friends and Family: More than three times a week    Frequency of Social Gatherings with Friends and Family: Once a week    Attends Religious Services: Never    Database Administrator or Organizations: Yes    Attends Engineer, Structural: More than 4 times per year    Marital Status: Married   Catering Manager Violence: Not At Risk (01/09/2024)   Humiliation, Afraid, Rape, and Kick questionnaire    Fear of Current or Ex-Partner: No    Emotionally Abused: No    Physically Abused: No    Sexually Abused: No    FAMILY HISTORY: Family History  Problem Relation Age of Onset   Hypertension Mother    Hyperlipidemia Mother    Liver cancer Mother 21       died in Feb 02, 2012, at age 41 or 63.   Allergies Mother    Diabetes Mother 19       onset mid fifties.   Healthy Father    Dementia Father 17       not officialy diagnosed   Asthma Maternal Grandmother    Diabetes Maternal Grandmother    Asthma Cousin    Healthy Other        2 siblings   Allergic rhinitis Neg Hx    Angioedema Neg Hx    Eczema Neg Hx    Immunodeficiency Neg Hx    Urticaria Neg Hx     CURRENT MEDICATIONS   Current Outpatient Medications  Medication Instructions   ARMOUR THYROID  60 MG tablet TAKE 1 TABLET BY MOUTH EVERY DAY ON EMPTY STOMACH FOR 90 DAYS   atenolol  (TENORMIN ) 25 mg, Oral, Daily   atorvastatin  (LIPITOR) 10 MG tablet TAKE 1 TABLET (10 MG TOTAL) BY MOUTH DAILY. **CALL TO SCHEDULE A FOLLOW UP FOR FUTURE MED REFILLS**   Azelastine  HCl 137 MCG/SPRAY SOLN PLACE 1 SPRAY INTO BOTH NOSTRILS 2 (TWO) TIMES DAILY. USE IN EACH NOSTRIL AS DIRECTED   cholecalciferol (VITAMIN D3) 1,000 Units, Daily   EPINEPHrine  (EPI-PEN) 0.3 mg, Intramuscular, As needed   fluticasone  (FLONASE ) 50 MCG/ACT nasal spray USE 2 SPRAY(S) IN EACH NOSTRIL ONCE DAILY   Insulin  Pen Needle 32G X 4 MM MISC 10 each, Does not apply, Weekly   Multiple Vitamin (MULTIVITAMIN) tablet 1 tablet, Daily   pantoprazole  (PROTONIX ) 40 mg, Oral, Daily   progesterone (PROMETRIUM) 100 mg, Daily   progesterone 100 mg, Daily at bedtime   Semaglutide ,0.25 or 0.5MG /DOS, (OZEMPIC , 0.25 OR 0.5 MG/DOSE,) 2 MG/3ML SOPN INJECT 0.25MG  INTO THE SKIN ONCE A WEEK 3ML=56DAYS AT THIS DOSE     ALLERGIES  She is allergic to azithromycin, codeine, penicillins,  metronidazole, nitrofurantoin macrocrystal, and shellfish allergy.  REVIEW OF SYSTEMS:    Review of Systems - Oncology  All of the other pertinent systems were reviewed with the patient and were unremarkable except as mentioned above in HPI.  PHYSICAL EXAMINATION:   Onc Performance Status - 01/09/24 1027       ECOG Perf Status  ECOG Perf Status Ambulatory and capable of all selfcare but unable to carry out any work activities.  Up and about more than 50% of waking hours      KPS SCALE   KPS % SCORE Normal activity with effort, some s/s of disease          Vitals:   01/09/24 0956  BP: 137/84  Pulse: 71  Resp: 15  Temp: 97.8 F (36.6 C)  SpO2: 100%   Filed Weights   01/09/24 0956  Weight: 137 lb 1.6 oz (62.2 kg)    Physical Exam Constitutional:      General: She is not in acute distress.    Appearance: Normal appearance.  HENT:     Head: Normocephalic and atraumatic.  Cardiovascular:     Rate and Rhythm: Normal rate.     Heart sounds: Normal heart sounds.  Pulmonary:     Effort: Pulmonary effort is normal. No respiratory distress.     Breath sounds: Normal breath sounds.  Abdominal:     General: There is no distension.  Neurological:     General: No focal deficit present.     Mental Status: She is alert and oriented to person, place, and time.  Psychiatric:        Mood and Affect: Mood normal.        Behavior: Behavior normal.      LABORATORY DATA:   I have reviewed the data as listed.  Results for orders placed or performed in visit on 01/09/24  CBC with Differential ([facility] Only)  Result Value Ref Range   WBC Count 9.7 4.0 - 10.5 K/uL   RBC 4.83 3.87 - 5.11 MIL/uL   Hemoglobin 15.4 (H) 12.0 - 15.0 g/dL   HCT 54.4 63.9 - 53.9 %   MCV 94.2 80.0 - 100.0 fL   MCH 31.9 26.0 - 34.0 pg   MCHC 33.8 30.0 - 36.0 g/dL   RDW 86.8 88.4 - 84.4 %   Platelet Count 208 150 - 400 K/uL   nRBC 0.0 0.0 - 0.2 %   Neutrophils Relative % 63 %   Neutro  Abs 6.1 1.7 - 7.7 K/uL   Lymphocytes Relative 27 %   Lymphs Abs 2.6 0.7 - 4.0 K/uL   Monocytes Relative 7 %   Monocytes Absolute 0.7 0.1 - 1.0 K/uL   Eosinophils Relative 2 %   Eosinophils Absolute 0.2 0.0 - 0.5 K/uL   Basophils Relative 1 %   Basophils Absolute 0.1 0.0 - 0.1 K/uL   Immature Granulocytes 0 %   Abs Immature Granulocytes 0.02 0.00 - 0.07 K/uL    Recent Results (from the past 2160 hours)  CBC with Differential/Platelet     Status: Abnormal   Collection Time: 10/24/23  8:55 AM  Result Value Ref Range   WBC 9.6 3.4 - 10.8 x10E3/uL   RBC 5.19 3.77 - 5.28 x10E6/uL   Hemoglobin 16.1 (H) 11.1 - 15.9 g/dL   Hematocrit 49.4 (H) 65.9 - 46.6 %   MCV 97 79 - 97 fL   MCH 31.0 26.6 - 33.0 pg   MCHC 31.9 31.5 - 35.7 g/dL   RDW 86.0 88.2 - 84.5 %   Platelets 203 150 - 450 x10E3/uL   Neutrophils 59 Not Estab. %   Lymphs 24 Not Estab. %   Monocytes 9 Not Estab. %   Eos 7 Not Estab. %   Basos 1 Not Estab. %   Neutrophils Absolute 5.7 1.4 - 7.0 x10E3/uL  Lymphocytes Absolute 2.3 0.7 - 3.1 x10E3/uL   Monocytes Absolute 0.8 0.1 - 0.9 x10E3/uL   EOS (ABSOLUTE) 0.7 (H) 0.0 - 0.4 x10E3/uL   Basophils Absolute 0.1 0.0 - 0.2 x10E3/uL   Immature Granulocytes 0 Not Estab. %   Immature Grans (Abs) 0.0 0.0 - 0.1 x10E3/uL  Comprehensive metabolic panel with GFR     Status: Abnormal   Collection Time: 10/24/23  8:55 AM  Result Value Ref Range   Glucose 76 70 - 99 mg/dL   BUN 23 8 - 27 mg/dL   Creatinine, Ser 9.19 0.57 - 1.00 mg/dL   eGFR 81 >40 fO/fpw/8.26   BUN/Creatinine Ratio 29 (H) 12 - 28   Sodium 137 134 - 144 mmol/L   Potassium 4.6 3.5 - 5.2 mmol/L   Chloride 103 96 - 106 mmol/L   CO2 21 20 - 29 mmol/L   Calcium  9.5 8.7 - 10.3 mg/dL   Total Protein 6.4 6.0 - 8.5 g/dL   Albumin 4.0 3.9 - 4.9 g/dL   Globulin, Total 2.4 1.5 - 4.5 g/dL   Bilirubin Total 0.3 0.0 - 1.2 mg/dL   Alkaline Phosphatase 62 44 - 121 IU/L    Comment: **Effective October 30, 2023 Alkaline  Phosphatase**   reference interval will be changing to:              Age                Female          Female           0 -  5 days         47 - 127       47 - 127           6 - 10 days         29 - 242       29 - 242          11 - 20 days        109 - 357      109 - 357          21 - 30 days         94 - 494       94 - 494           1 -  2 months      149 - 539      149 - 539           3 -  6 months      131 - 452      131 - 452           7 - 11 months      117 - 401      117 - 401   12 months -  6 years       158 - 369      158 - 369           7 - 12 years       150 - 409      150 - 409               13 years       156 - 435       78 - 227               14 years       114 -  375       64 - 161               15 years        88 - 279       56 - 134               16 years        74 - 207       51 - 121               17 years        63 - 161       47 - 113          18 - 20 years        51 - 125       42 - 106          21 - 50 years         47 - 123       41 - 116          51 - 80 years        49 - 135       51 - 125              >80 years        48 - 129       48 - 129    AST 19 0 - 40 IU/L   ALT 21 0 - 32 IU/L  Lipid panel     Status: Abnormal   Collection Time: 10/24/23  8:55 AM  Result Value Ref Range   Cholesterol, Total 92 (L) 100 - 199 mg/dL   Triglycerides 59 0 - 149 mg/dL   HDL 34 (L) >60 mg/dL   VLDL Cholesterol Cal 14 5 - 40 mg/dL   LDL Chol Calc (NIH) 44 0 - 99 mg/dL   Chol/HDL Ratio 2.7 0.0 - 4.4 ratio    Comment:                                   T. Chol/HDL Ratio                                             Men  Women                               1/2 Avg.Risk  3.4    3.3                                   Avg.Risk  5.0    4.4                                2X Avg.Risk  9.6    7.1                                3X Avg.Risk 23.4   11.0   Hemoglobin A1c     Status: Abnormal   Collection Time: 10/24/23  8:55  AM  Result Value Ref Range   Hgb A1c MFr Bld 6.2 (H) 4.8 -  5.6 %    Comment:          Prediabetes: 5.7 - 6.4          Diabetes: >6.4          Glycemic control for adults with diabetes: <7.0    Est. average glucose Bld gHb Est-mCnc 131 mg/dL  TSH     Status: None   Collection Time: 10/24/23  8:55 AM  Result Value Ref Range   TSH 1.350 0.450 - 4.500 uIU/mL  JAK2 V617F rfx CALR/MPL/E12-15     Status: None   Collection Time: 10/31/23  2:51 PM  Result Value Ref Range   Specimen Type Comment:     Comment: NOT PROVIDED   JAK2 V617F Result Comment     Comment: NEGATIVE The JAK2 V617F mutation is not detected in the provided specimen of this individual. Results should be interpreted in conjunction with clinical and other laboratory findings for the most accurate interpretation.    Reflex Comment     Comment: Reflex to CALR Mutation Analysis, JAK2 Exon 12-15 Mutation Analysis, and MPL Mutation Analysis is indicated.    V617F Rfx CALR/MPL/E12-15 Bkgd Comment     Comment:     Molecular testing of blood or bone marrow is useful in the evaluation of suspected myeloproliferative neoplasms (MPN). Mutations in the JAK2, MPL, and CALR genes are present in virtually all MPNs and their presence help distinguish benign reactive processes from clonal neoplasms. These mutations are generally considered mutually exclusive, although concurrent clones have been reported in rare patients. This test will assess for the JAK2V617F (exon 14) mutation first and will reflex to CALR mutation analysis, MPL mutation analysis, and JAK2 exon 12 to 15 mutation analysis if the JAK2V617F mutation is negative.     The JAK2 (Janus kinase 2) gene encodes for a non-receptor protein tyrosine kinase that activates cytokine and growth factor signaling. The V617F (c.1849 G>T) mutation results in constitutive activation of JAK2 and downstream STAT5 and ERK signaling. The V617F mutation is observed in approximately 95% of polycythemia vera (PV), 60% of essential thrombocythemi a  (ET) and primary myelofibrosis (PMF). It is also infrequently present (3-5%) in myelodysplastic syndrome, chronic myelomonocytic leukemia, and other atypical chronic myeloid disorders. A small percentage of JAK2 mutation positive patients (3.3%) contain other non-V617F mutations within exons 12 to 15. In particular, mutations in exon 12 of JAK2 have been described in approximately 3% of patients with PV. JAK2 allele burden correlates with clinical phenotype, with low levels of mutant allele characterized by thrombocytosis, intermediate levels with erythrocytosis, and high mutant allele burden correlating with enhanced myelopoiesis of the BM, leukocytosis, increasing spleen size, and circulating CD34-positive cells.     The CALR (Calreticulin) gene encodes for a multifunctional calcium -binding protein involved in many cellular activities such as growth, proliferation, adhesion, and programmed cell death. Among patients with JAK2 negative MPNs, CALR are found in approx imately 70% of patients with JAK2-negative essential thrombocythemia (ET) and 60-88% of patients with JAK2-negative primary myelofibrosis(PMF). Only a minority of patients (approximately 8%) with myelodysplasia have mutations in the CALR gene. CALR mutations are rarely detected in patients with de novo acute myeloid leukemia, chronic myelogenous leukemia, lymphoid leukemia, or solid tumors. CALR mutations are not detected in polycythemia and generally appear to be mutually exclusive with JAK2 mutations and MPL mutations. The majority of mutational changes involve a variety of insertion deletion mutations  in exon 9 of the calreticulin gene: approximately 53% of all CALR mutations are a 52 bp deletion (type-1) while the second most prevalent mutation (approximately 32%) contains a 5 bp insertion (type-2). Other mutations (non-type 1 or type 2) are seen in a small minority of cases. CALR mutations in PMF tend to be with a  favorable prognosis compared to JAK2 V617F mutations , whereas primary myelofibrosis negative for CALR, JAK2 V617F and MPL mutations (so-called triple negative) is associated with a poor prognosis and shorter survival.     The MPL (myeloproliferative leukemia virus oncogene) gene encodes the thrombopoietin receptor which regulates hematopoiesis and megakaryopoiesis. Activating MPL mutations are associated with a subset of myeloproliferative neoplasms and acute megakaryoblastic leukemia. MPL W515 mutations are present in approximately 5-8% of patients with primary myelofibrosis (PMF) and 1-4% of patients with essential thrombocythemia (ET). The S505 mutation is detected in patients with hereditary thrombocythemia.     Limitations     This assay has a sensitivity of approximately 1% VAF for JAK2 V617F, 2.5% VAF for other mutations in JAK2 exons 12 to 15, CALR mutations, and MPL mutations. Deletions in JAK2 up to 6 bp and insertions up to 34 bp have been detected in validation studies. Deletions in CALR up to 70 bp and ins ertions up to 12 bp have been detected in validation studies.    Method Comment     Comment: [employer]-based next generation sequencing.   References Comment     Comment: Lawson LOISE Monetta CINDERELLA Mervyn VEAR Collier GORMAN. Detection of mutations in JAK2 exons 12-15 by [employer] sequencing. Int J Lab Hematol. 2016 Feb;38(1):34-41. doi: 10.1111/ijlh.87574. Epub 2015 Sep 11. PMID: 73638915. Glorine ISLE, Cheryll LABOR, Hasserjian R, Omega PARAS, Borowitz MJ, Ladora Campanile MM, [place] CD, Cazzola M, Vardiman JW. The 2016 revision to the World Health Organization classification of myeloid neoplasms and acute leukemia. Blood. 2016 May 19;127(20):2391-405. doi: 10.1182/blood-2016-03-643544. Epub 2016 Apr 11. PMID: 72930745. Honor LELON Glennie VEAR Laurita OLEGARIO [facility], Zhang ZJ, Harlingen S, Albitar M. Mutation profile of JAK2 transcripts in patients with chronic myeloproliferative neoplasias. J Mol  Diagn. 2009 Jan;11(1):49-53.doi: 10.2353/jmoldx.2009.080114. Epub 2008 Dec 12. PMID: 80925404; PMCID: EFR7392434. NCCN Clinical Practice Guidelines in Oncology (NCCN Guidelines) Myeloproliferative Neoplasms Version 3.2022 - September 24, 2020. Swerdlow SH, programmer, multimedia. WHO classification  of Tumours of Haematopoietic and Lymphoid Tissues. 4th edn. Epifanio, [place]: Geologist, Engineering for [employer] on [employer]; 2017. Tefferi A. Primary myelofibrosis: 2021 update on diagnosis, risk-stratification and management. Am J Hematol. 2021 Jan;96(1):145-162. doi: 10.1002/ajh.26050. Epub 2020 Dec 2. PMID: 66802950. Vainchenker W, Kralovics R. Genetic basis and molecular pathophysiology of classical myeloproliferative neoplasms. Blood. 2017 Feb 9;129(6):667-679. doi: 10.1182/blood-2016-10-695940. Epub 2016 Dec 27. PMID: 71971970.    Director Review Comment     Comment: Engineer, Production performed at [facility] performed by: Rosario Flatten, PhD, [facility] Director, Molecular Oncology [facility] [place], [address] [place] [address] [phone]   Iron, TIBC and Ferritin Panel     Status: Abnormal   Collection Time: 10/31/23  2:51 PM  Result Value Ref Range   Total Iron Binding Capacity 294 250 - 450 ug/dL   UIBC 740 881 - 630 ug/dL   Iron 35 27 - 860 ug/dL   Iron Saturation 12 (L) 15 - 55 %   Ferritin 55 15 - 150 ng/mL  CALR +MPL + E12-E15 (reflexed)     Status: None   Collection Time: 10/31/23  2:51 PM  Result Value Ref Range  CALR Result Comment     Comment: NEGATIVE No insertions or deletions were detected within the analyzed region of the calreticulin (CALR) gene. A negative result does not entirely exclude the possibility of a clonal population carrying CALR gene mutations that are not covered by this assay. Results should be interpreted in conjunction with clinical and laboratory findings for the most accurate interpretation.    MPL Result  Comment     Comment: NEGATIVE No MPL mutation was identified in the provided specimen of this individual. Results should be interpreted in conjunction with clinical and other laboratory findings for the most accurate interpretation.    E12-15 Result Comment     Comment: NEGATIVE     JAK2 mutations were not detected in exons 12, 13, 14 and 15. The G to T nucleotide change encoding the V617F mutation was not detected. This result does not rule out the presence of JAK2 mutation at a level below the detection sensitivity of this assay, the presence of other mutations outside the analyzed region of the JAK2 gene, or the presence of a myeloproliferative or other neoplasm. Result must be correlated with other clinical data for the most accurate diagnosis.   CBC with Differential ([facility] Only)     Status: Abnormal   Collection Time: 01/09/24 11:03 AM  Result Value Ref Range   WBC Count 9.7 4.0 - 10.5 K/uL   RBC 4.83 3.87 - 5.11 MIL/uL   Hemoglobin 15.4 (H) 12.0 - 15.0 g/dL   HCT 54.4 63.9 - 53.9 %   MCV 94.2 80.0 - 100.0 fL   MCH 31.9 26.0 - 34.0 pg   MCHC 33.8 30.0 - 36.0 g/dL   RDW 86.8 88.4 - 84.4 %   Platelet Count 208 150 - 400 K/uL   nRBC 0.0 0.0 - 0.2 %   Neutrophils Relative % 63 %   Neutro Abs 6.1 1.7 - 7.7 K/uL   Lymphocytes Relative 27 %   Lymphs Abs 2.6 0.7 - 4.0 K/uL   Monocytes Relative 7 %   Monocytes Absolute 0.7 0.1 - 1.0 K/uL   Eosinophils Relative 2 %   Eosinophils Absolute 0.2 0.0 - 0.5 K/uL   Basophils Relative 1 %   Basophils Absolute 0.1 0.0 - 0.1 K/uL   Immature Granulocytes 0 %   Abs Immature Granulocytes 0.02 0.00 - 0.07 K/uL    Comment: Performed at [facility], [address] [place], [place] [address]      RADIOGRAPHIC STUDIES:  No recent pertinent imaging available to review.  Orders Placed This Encounter  Procedures   CBC with Differential ([facility] Only)    Standing Status:   Future    Number of  Occurrences:   1    Expiration Date:   01/08/2025   CMP ([facility] only)    Standing Status:   Future    Number of Occurrences:   1    Expiration Date:   01/08/2025   Lactate dehydrogenase    Standing Status:   Future    Number of Occurrences:   1    Expiration Date:   01/08/2025   Iron and TIBC    Standing Status:   Future    Number of Occurrences:   1    Expiration Date:   01/08/2025   Ferritin    Standing Status:   Future    Number of Occurrences:   1    Expiration Date:   01/08/2025   Erythropoietin   Standing Status:   Future    Number of Occurrences:   1    Expiration Date:   01/08/2025    Future Appointments  Date Time Provider Department [facility]  03/05/2024  9:30 AM Gayle Saddie JULIANNA DEVONNA [facility]  05/16/2024 10:10 AM [facility]OM[facility] 2 [facility]     I spent a total of 55 minutes during this encounter with the patient including review of chart and various tests results, discussions about plan of care and coordination of care plan.   This document was completed utilizing speech recognition software. Grammatical errors, random word insertions, pronoun errors, and incomplete sentences are an occasional consequence of this system due to software limitations, ambient noise, and hardware issues. Any formal questions or concerns about the content, text or information contained within the body of this dictation should be directly addressed to the provider for clarification.

## 2024-01-09 NOTE — Assessment & Plan Note (Signed)
 On 10/23/2021, labs at her PCPs office showed hemoglobin of 16.1, hematocrit of 50.5.  White count and platelet count were within normal limits.  She had additional testing on 10/31/2023. JAK2 V617F mutation analysis, followed by reflex testing to include CALR, MPL, exon 12-15 mutations were all negative.  Iron studies showed iron saturation of 12%, normal iron level.  Ferritin was normal at 55.  She was referred to us  for further evaluation of polycythemia.  She has experienced elevated hemoglobin levels for the past few years, with values recorded at 16.8 g/dL in January 2020, 83.3 g/dL in 7977, and 83.8 g/dL in September 7974. Her hematocrit was noted to be 50% in the last test.  Since JAK2 testing with extended panel testing was negative, no concern for primary myeloproliferative neoplasm.  Discussed secondary causes of polycythemia including testosterone supplementation, obstructive sleep apnea, early erythropoietin  producing kidney tumors.  She does get testosterone supplementation, along with estrogen via implants every 3 months for her menopausal symptoms.  This could be contributing to her mild polycythemia.  She does not have any symptoms suggestive of erythromelalgia.  Given secondary polycythemia, goal would be to maintain hematocrit below 55 to avoid hyperviscosity related complications.  Labs today showed improved hemoglobin of 15.4, hematocrit of 45.5.  White count and platelet count are within normal limits.  We did check erythropoietin  level today.  She was provided reassurance.  Currently no indication for therapeutic phlebotomies or other interventions.  RTC in 4 months for follow-up with repeat labs.  If labs are stable, she can be discharged from our office after next visit for continued follow-up with her PCP.

## 2024-01-10 ENCOUNTER — Telehealth: Payer: Self-pay

## 2024-01-10 ENCOUNTER — Other Ambulatory Visit: Payer: Self-pay

## 2024-01-10 DIAGNOSIS — E1169 Type 2 diabetes mellitus with other specified complication: Secondary | ICD-10-CM

## 2024-01-10 LAB — ERYTHROPOIETIN: Erythropoietin: 9.2 m[IU]/mL (ref 2.6–18.5)

## 2024-01-10 MED ORDER — OZEMPIC (0.25 OR 0.5 MG/DOSE) 2 MG/3ML ~~LOC~~ SOPN
0.5000 mg | PEN_INJECTOR | SUBCUTANEOUS | 4 refills | Status: AC
Start: 2024-01-10 — End: ?

## 2024-01-10 NOTE — Telephone Encounter (Signed)
 I have sent in the increased dose to her pharmacy

## 2024-01-10 NOTE — Telephone Encounter (Signed)
 See documentation below  Copied from CRM #[id number]. Topic: Clinical - Medication Question >> Jan 10, 2024  8:07 AM Avram MATSU wrote: Reason for CRM: patient would like to up her dose for Semaglutide ,0.25 or 0.5MG /DOS, (OZEMPIC , 0.25 OR 0.5 MG/DOSE,) 2 MG/3ML SOPN [[id number]]   Patient stated her sugar and tried. Her diet has not changed and since OV pt gained 2 pounds. Please advise [phone]

## 2024-01-14 ENCOUNTER — Other Ambulatory Visit: Payer: Self-pay

## 2024-01-14 DIAGNOSIS — J3089 Other allergic rhinitis: Secondary | ICD-10-CM

## 2024-01-14 DIAGNOSIS — E1169 Type 2 diabetes mellitus with other specified complication: Secondary | ICD-10-CM

## 2024-01-15 ENCOUNTER — Ambulatory Visit: Payer: Self-pay

## 2024-01-15 NOTE — Telephone Encounter (Signed)
 Yes please call her and ask what side effect she is having regarding the Ozempic  and if I need to call her back this evening when I am done with clinic, I can.

## 2024-01-15 NOTE — Telephone Encounter (Signed)
 Pt states that she wanted to increase her dose of ozempic , as she does not feel as though it is working. Pt was advised that PCP had sent increased dose to pharm. Pt then states she would like to speak to PCP about side effects of ozempic . Pt declines appt at this time, states she would like to speak to her PCP.  Copied from CRM #[id number]. Topic: Clinical - Medication Question >> Jan 15, 2024 10:14 AM Tonda B wrote: Reason for CRM: atient would like to up her dose for Semaglutide ,0.25 or 0.5MG /DOS, (OZEMPIC , 0.25 OR 0.5 MG/DOSE,) 2 MG/3ML SOPN [[id number]]    Patient stated her sugar and tried. Her diet has not changed and since OV pt gained 2 pounds. Please advise [phone] Reason for Disposition . [1] Caller has NON-URGENT medicine question about med that PCP prescribed AND [2] triager unable to answer question  Protocols used: Medication Question Call-A-AH

## 2024-01-15 NOTE — Telephone Encounter (Signed)
 Tried calling; no VM set up to leave message. Will try again later.

## 2024-01-17 ENCOUNTER — Other Ambulatory Visit: Payer: Self-pay

## 2024-01-17 NOTE — Telephone Encounter (Signed)
 Called and spoke with patient. Forward her information to provider.

## 2024-01-18 ENCOUNTER — Other Ambulatory Visit: Payer: Self-pay

## 2024-01-18 DIAGNOSIS — J3089 Other allergic rhinitis: Secondary | ICD-10-CM

## 2024-01-18 MED ORDER — AZELASTINE HCL 137 MCG/SPRAY NA SOLN: NASAL | 1 refills | Status: AC

## 2024-01-18 NOTE — Progress Notes (Signed)
 Called and spoke with patient via telephone today.  Discussed several issues that the patient has going on including increased nosebleeds for the last 3 to 4 weeks.  She attributes this to nasal dryness and allergies.  She has been using Flonase  and azelastine  spray.  Reports that the nosebleeds are improving and she has not had a nosebleed in 4 to 5 days. We discussed Afrin nasal spray to use for acute nosebleeds to stop the bleeding.  Discussed risks and benefits of Afrin and did advise not to use daily.  Also discussed recent upper respiratory symptoms that she has been experiencing for about 5 days including nasal congestion, mild cough, ear pain.  She reports that yesterday she noticed tenderness over her sinuses and tooth pain, however the symptoms have eased off today and she is using nasal lavage to clear out her sinuses.  She would like to hold off on antibiotics at this time.  Advised her that if symptoms persist over the next 4 to 5 days to let me know and I will send in an antibiotic to treat what is likely acute sinusitis.  Patient also experiencing some side effects from her Ozempic .  She has been very sensitive to this medication and has been on the starting dose (0.25) for quite some time.  Reports that she has been experiencing constipation and does not know how much MiraLAX to take.  I advised the patient to take 1 capful of MiraLAX daily for maintenance on her bowel movements and her goal for bowel movement should be at least 3-5 bowel movements per week.  Also discussed the use of Dulcolax as rescue therapy when treating constipation.  Discussed increased fluid intake.  Patient also reports that ever since she started the Ozempic , she has been experiencing mild hair loss that over the last several weeks has gotten worse.  We discussed that she has not experienced a rapid weight loss that would contribute to hair loss, however everybody is different.  We discussed increasing her protein  intake and aiming for a goal of 65 to 80 g of protein per day.  Again, discussed increased water intake.  Also discussed biotin supplement for hair skin and nail support.  Gave patient the option of starting topical minoxidil for hair regrowth, however she would like to defer this decision until later.  Reports that she will notify if she decides this is something she wants to do.  If hair loss continues to be an issue, patient would likely benefit from discontinuing the Ozempic  altogether and potentially replacing with something like Jardiance or Farxiga.  Verbalized understanding and was in agreement with the plan.  Saddie JULIANNA Sacks, PA-C

## 2024-01-28 MED ORDER — DOXYCYCLINE HYCLATE 100 MG PO TABS: 100.0000 mg | ORAL_TABLET | Freq: Two times a day (BID) | ORAL | 0 refills | Status: DC

## 2024-02-02 ENCOUNTER — Other Ambulatory Visit: Payer: Self-pay

## 2024-02-10 ENCOUNTER — Other Ambulatory Visit: Payer: Self-pay

## 2024-02-10 DIAGNOSIS — J3089 Other allergic rhinitis: Secondary | ICD-10-CM

## 2024-02-10 DIAGNOSIS — E1169 Type 2 diabetes mellitus with other specified complication: Secondary | ICD-10-CM

## 2024-02-12 ENCOUNTER — Other Ambulatory Visit: Payer: Self-pay

## 2024-02-12 MED ORDER — PREDNISONE 20 MG PO TABS: 20.0000 mg | ORAL_TABLET | Freq: Every day | ORAL | 0 refills | Status: DC

## 2024-03-05 ENCOUNTER — Ambulatory Visit

## 2024-03-05 VITALS — BP 126/70 | HR 73 | Temp 98.1°F | Ht 64.0 in | Wt 137.0 lb

## 2024-03-05 DIAGNOSIS — I152 Hypertension secondary to endocrine disorders: Secondary | ICD-10-CM | POA: Diagnosis not present

## 2024-03-05 DIAGNOSIS — D582 Other hemoglobinopathies: Secondary | ICD-10-CM | POA: Diagnosis not present

## 2024-03-05 DIAGNOSIS — E039 Hypothyroidism, unspecified: Secondary | ICD-10-CM

## 2024-03-05 DIAGNOSIS — E1159 Type 2 diabetes mellitus with other circulatory complications: Secondary | ICD-10-CM | POA: Diagnosis not present

## 2024-03-05 DIAGNOSIS — Z7985 Long-term (current) use of injectable non-insulin antidiabetic drugs: Secondary | ICD-10-CM

## 2024-03-05 DIAGNOSIS — E782 Mixed hyperlipidemia: Secondary | ICD-10-CM | POA: Diagnosis not present

## 2024-03-05 DIAGNOSIS — E1169 Type 2 diabetes mellitus with other specified complication: Secondary | ICD-10-CM | POA: Diagnosis not present

## 2024-03-05 NOTE — Assessment & Plan Note (Signed)
 BP goal 130/80. Blood pressure controlled with atenolol .  Continue this medication daily and encouraged ambulatory BP monitoring. Will cont to monitor.

## 2024-03-05 NOTE — Progress Notes (Signed)
 "  Established Patient Office Visit  Subjective   Patient ID: Dominique Brock, female    DOB: [birth year]  Age: 69 y.o. MRN: [id number]  Chief Complaint  Patient presents with   Medical Management of Chronic Issues    HPI  Discussed the use of AI scribe software for clinical note transcription with the patient, who gave verbal consent to proceed.  History of Present Illness   Dominique Brock is a 69 year old female with diabetes and hyperlipidemia who presents for a routine follow-up visit.  Glycemic control - Currently on Ozempic  0.5 mg, administered with one extra click beyond the 0.25 mg mark. - No nausea associated with Ozempic . - Currently doing well on medication without side effects - Has increased her protein intake which she believes has started to help with her hair loss  Hyperlipidemia and hair loss - Taking atorvastatin , which has been split in half.  Dental procedure and postoperative pain - Recently completed a two-week course of clindamycin following a tooth extraction. - Post-extraction pain lasted for two weeks but has since resolved.  Blood pressure monitoring - Does not regularly monitor blood pressure at home. - Attributes any elevations in blood pressure to stress and lack of sleep.  Thyroid  function - On Armour Thyroid  60 mg, with no recent changes to the dose. - Recent thyroid  function tests reported as normal.  Erythrocytosis and hormone therapy - Elevated red blood cell count, attributed to testosterone therapy. - Receives testosterone and estrogen in pellet form from [facility], but does not recall the specific dose. - Cleared from hematology to follow up PRN  Ophthalmologic status - Recent eye exam at [facility] following eye surgery. - Vision measured as 20/20 in one eye and 20/30 in the other.         ROS Per HPI.    Objective:     BP 126/70   Pulse 73   Temp 98.1 F (36.7 C) (Oral)   Ht 5' 4 (1.626 m)   Wt 137 lb 0.6 oz (62.2  kg)   SpO2 100%   BMI 23.52 kg/m    Physical Exam Constitutional:      General: She is not in acute distress.    Appearance: Normal appearance.  Cardiovascular:     Rate and Rhythm: Normal rate and regular rhythm.     Heart sounds: Normal heart sounds. No murmur heard.    No friction rub. No gallop.  Pulmonary:     Effort: Pulmonary effort is normal. No respiratory distress.     Breath sounds: Normal breath sounds.  Musculoskeletal:        General: No swelling.  Skin:    General: Skin is warm and dry.  Neurological:     General: No focal deficit present.     Mental Status: She is alert.  Psychiatric:        Mood and Affect: Mood normal.        Behavior: Behavior normal.        Thought Content: Thought content normal.     No results found for any visits on 03/05/24.  Last CBC Lab Results  Component Value Date   WBC 9.7 01/09/2024   HGB 15.4 (H) 01/09/2024   HCT 45.5 01/09/2024   MCV 94.2 01/09/2024   MCH 31.9 01/09/2024   RDW 13.1 01/09/2024   PLT 208 01/09/2024   Last metabolic panel Lab Results  Component Value Date   GLUCOSE 110 (H) 01/09/2024   NA  137 01/09/2024   K 4.2 01/09/2024   CL 103 01/09/2024   CO2 25 01/09/2024   BUN 26 (H) 01/09/2024   CREATININE 0.81 01/09/2024   GFRNONAA >60 01/09/2024   CALCIUM  9.7 01/09/2024   PROT 7.0 01/09/2024   ALBUMIN 3.9 01/09/2024   LABGLOB 2.4 10/24/2023   AGRATIO 1.7 06/22/2022   BILITOT 0.4 01/09/2024   ALKPHOS 60 01/09/2024   AST 18 01/09/2024   ALT 16 01/09/2024   ANIONGAP 9 01/09/2024   Last lipids Lab Results  Component Value Date   CHOL 92 (L) 10/24/2023   HDL 34 (L) 10/24/2023   LDLCALC 44 10/24/2023   TRIG 59 10/24/2023   CHOLHDL 2.7 10/24/2023   Last hemoglobin A1c Lab Results  Component Value Date   HGBA1C 6.2 (H) 10/24/2023   Last thyroid  functions Lab Results  Component Value Date   TSH 1.350 10/24/2023   FREET4 0.54 (L) 06/06/2017   THYROIDAB <1 03/11/2016   Last vitamin  D Lab Results  Component Value Date   VD25OH 83.0 04/07/2022      The ASCVD Risk score (Arnett DK, et al., 2019) failed to calculate for the following reasons:   The valid total cholesterol range is 130 to 320 mg/dL    Assessment & Plan:   Type 2 diabetes mellitus with other specified complication, without long-term current use of insulin  (HCC) -     Hemoglobin A1c; Future -     Lipid panel; Future  Hypertension associated with diabetes (HCC) Assessment & Plan: BP goal 130/80. Blood pressure controlled with atenolol .  Continue this medication daily and encouraged ambulatory BP monitoring. Will cont to monitor.  Orders: -     Comprehensive metabolic panel with GFR; Future -     CBC with Differential/Platelet; Future -     Lipid panel; Future  Elevated hemoglobin Assessment & Plan: Stable; rechecking with labs. Per hematology follow up PRN. Likely due to testosterone therapy prescribed by [facility].   Hypothyroidism, unspecified type Assessment & Plan: Thyroid  function well-managed on 60 mg Armour thyroid . - Managed by [facility] - Continue 60 mg Armour thyroid . - Recheck thyroid  function during next lab work.   Mixed diabetic hyperlipidemia associated with type 2 diabetes mellitus (HCC) Assessment & Plan: Rechecking lipid panel with labs. This will be the first time we have checked since decreasing atorvastatin . Cont 5 mg for now. If elevated, will go back up to 10 mg dose.      Return in about 6 weeks (around 04/16/2024) for HTN, HLD, DM.    Dominique JULIANNA Sacks, PA-C "

## 2024-03-05 NOTE — Patient Instructions (Signed)
 VISIT SUMMARY: During your visit, we reviewed your diabetes, hyperlipidemia, thyroid  function, hair loss, and blood pressure. We also discussed your recent dental procedure and postoperative pain, as well as your hormone therapy and eye health.  YOUR PLAN: TYPE 2 DIABETES MELLITUS WITH CIRCULATORY COMPLICATIONS: Your blood sugar levels are generally well-controlled with occasional spikes. -Continue taking Ozempic  0.5 mg. -We will recheck your A1c during your next lab work.  HYPOTHYROIDISM: Your thyroid  function is well-managed on 60 mg Armour thyroid . -Continue taking 60 mg Armour thyroid . -We will recheck your thyroid  function during your next lab work.  NONSCARRING HAIR LOSS: Your hair thinning is likely due to stress and hormonal changes. -Consider micro needling if desired.  POLYCYTHEMIA: Your elevated red blood cell count is due to testosterone therapy. -We will check your blood counts during your next lab work. -Consider donating blood to manage your elevated red blood cell count.  If you have any problems before your next visit feel free to message me via MyChart (minor issues or questions) or call the office, otherwise you may reach out to schedule an office visit.  Thank you! Saddie Sacks, PA-C

## 2024-03-05 NOTE — Assessment & Plan Note (Signed)
 Rechecking lipid panel with labs. This will be the first time we have checked since decreasing atorvastatin . Cont 5 mg for now. If elevated, will go back up to 10 mg dose.

## 2024-03-05 NOTE — Assessment & Plan Note (Signed)
 Thyroid  function well-managed on 60 mg Armour thyroid . - Managed by [facility] - Continue 60 mg Armour thyroid . - Recheck thyroid  function during next lab work.

## 2024-03-05 NOTE — Assessment & Plan Note (Signed)
 Stable; rechecking with labs. Per hematology follow up PRN. Likely due to testosterone therapy prescribed by [facility].

## 2024-03-11 ENCOUNTER — Other Ambulatory Visit: Payer: Self-pay | Admitting: Family Medicine

## 2024-03-11 DIAGNOSIS — E1159 Type 2 diabetes mellitus with other circulatory complications: Secondary | ICD-10-CM

## 2024-03-13 ENCOUNTER — Other Ambulatory Visit

## 2024-03-13 DIAGNOSIS — I152 Hypertension secondary to endocrine disorders: Secondary | ICD-10-CM

## 2024-03-13 DIAGNOSIS — E1169 Type 2 diabetes mellitus with other specified complication: Secondary | ICD-10-CM

## 2024-03-14 ENCOUNTER — Ambulatory Visit: Payer: Self-pay

## 2024-03-14 LAB — CBC WITH DIFFERENTIAL/PLATELET
Basophils Absolute: 0.1 10*3/uL (ref 0.0–0.2)
Basos: 1 %
EOS (ABSOLUTE): 0.6 10*3/uL — ABNORMAL HIGH (ref 0.0–0.4)
Eos: 7 %
Hematocrit: 48.3 % — ABNORMAL HIGH (ref 34.0–46.6)
Hemoglobin: 15.7 g/dL (ref 11.1–15.9)
Immature Grans (Abs): 0 10*3/uL (ref 0.0–0.1)
Immature Granulocytes: 0 %
Lymphocytes Absolute: 2.9 10*3/uL (ref 0.7–3.1)
Lymphs: 35 %
MCH: 31.2 pg (ref 26.6–33.0)
MCHC: 32.5 g/dL (ref 31.5–35.7)
MCV: 96 fL (ref 79–97)
Monocytes Absolute: 0.9 10*3/uL (ref 0.1–0.9)
Monocytes: 11 %
Neutrophils Absolute: 3.9 10*3/uL (ref 1.4–7.0)
Neutrophils: 46 %
Platelets: 214 10*3/uL (ref 150–450)
RBC: 5.03 x10E6/uL (ref 3.77–5.28)
RDW: 13.3 % (ref 11.7–15.4)
WBC: 8.3 10*3/uL (ref 3.4–10.8)

## 2024-03-14 LAB — COMPREHENSIVE METABOLIC PANEL WITH GFR
ALT: 21 [IU]/L (ref 0–32)
AST: 16 [IU]/L (ref 0–40)
Albumin: 3.6 g/dL — ABNORMAL LOW (ref 3.9–4.9)
Alkaline Phosphatase: 54 [IU]/L (ref 49–135)
BUN/Creatinine Ratio: 29 — ABNORMAL HIGH (ref 12–28)
BUN: 27 mg/dL (ref 8–27)
Bilirubin Total: 0.3 mg/dL (ref 0.0–1.2)
CO2: 22 mmol/L (ref 20–29)
Calcium: 9.2 mg/dL (ref 8.7–10.3)
Chloride: 101 mmol/L (ref 96–106)
Creatinine, Ser: 0.92 mg/dL (ref 0.57–1.00)
Globulin, Total: 2.8 g/dL (ref 1.5–4.5)
Glucose: 97 mg/dL (ref 70–99)
Potassium: 4.4 mmol/L (ref 3.5–5.2)
Sodium: 136 mmol/L (ref 134–144)
Total Protein: 6.4 g/dL (ref 6.0–8.5)
eGFR: 69 mL/min/{1.73_m2}

## 2024-03-14 LAB — LIPID PANEL
Chol/HDL Ratio: 3.1 ratio (ref 0.0–4.4)
Cholesterol, Total: 100 mg/dL (ref 100–199)
HDL: 32 mg/dL — ABNORMAL LOW
LDL Chol Calc (NIH): 51 mg/dL (ref 0–99)
Triglycerides: 81 mg/dL (ref 0–149)
VLDL Cholesterol Cal: 17 mg/dL (ref 5–40)

## 2024-03-14 LAB — HEMOGLOBIN A1C
Est. average glucose Bld gHb Est-mCnc: 123 mg/dL
Hgb A1c MFr Bld: 5.9 % — ABNORMAL HIGH (ref 4.8–5.6)

## 2024-04-15 ENCOUNTER — Ambulatory Visit: Admitting: Endocrinology

## 2024-05-07 ENCOUNTER — Inpatient Hospital Stay

## 2024-05-07 ENCOUNTER — Inpatient Hospital Stay: Admitting: Oncology

## 2024-05-16 ENCOUNTER — Encounter

## 2024-08-26 ENCOUNTER — Other Ambulatory Visit

## 2024-09-02 ENCOUNTER — Ambulatory Visit
